# Patient Record
Sex: Female | Born: 1960
Health system: Southern US, Community
[De-identification: ages and names within clinical notes are randomized; demographics above are authoritative.]

## PROBLEM LIST (undated history)

## (undated) DIAGNOSIS — F79 Unspecified intellectual disabilities: Secondary | ICD-10-CM

## (undated) DIAGNOSIS — M1990 Unspecified osteoarthritis, unspecified site: Secondary | ICD-10-CM

## (undated) DIAGNOSIS — F32A Depression, unspecified: Secondary | ICD-10-CM

## (undated) DIAGNOSIS — T8859XA Other complications of anesthesia, initial encounter: Secondary | ICD-10-CM

## (undated) DIAGNOSIS — F419 Anxiety disorder, unspecified: Secondary | ICD-10-CM

## (undated) DIAGNOSIS — K760 Fatty (change of) liver, not elsewhere classified: Secondary | ICD-10-CM

## (undated) DIAGNOSIS — F329 Major depressive disorder, single episode, unspecified: Secondary | ICD-10-CM

## (undated) DIAGNOSIS — R519 Headache, unspecified: Secondary | ICD-10-CM

## (undated) DIAGNOSIS — K56609 Unspecified intestinal obstruction, unspecified as to partial versus complete obstruction: Secondary | ICD-10-CM

## (undated) DIAGNOSIS — T7840XA Allergy, unspecified, initial encounter: Secondary | ICD-10-CM

## (undated) HISTORY — PX: OTHER SURGICAL HISTORY: SHX169

## (undated) HISTORY — PX: ABDOMINAL HYSTERECTOMY: SHX81

## (undated) HISTORY — PX: SIGMOIDOSCOPY: SUR1295

## (undated) HISTORY — PX: COLONOSCOPY: SHX174

## (undated) HISTORY — DX: Allergy, unspecified, initial encounter: T78.40XA

## (undated) HISTORY — DX: Depression, unspecified: F32.A

## (undated) HISTORY — PX: POLYPECTOMY: SHX149

## (undated) HISTORY — DX: Major depressive disorder, single episode, unspecified: F32.9

## (undated) HISTORY — PX: KNEE DISLOCATION SURGERY: SHX689

## (undated) HISTORY — DX: Anxiety disorder, unspecified: F41.9

## (undated) HISTORY — DX: Fatty (change of) liver, not elsewhere classified: K76.0

## (undated) HISTORY — DX: Unspecified intellectual disabilities: F79

## (undated) HISTORY — DX: Unspecified osteoarthritis, unspecified site: M19.90

## (undated) HISTORY — DX: Unspecified intestinal obstruction, unspecified as to partial versus complete obstruction: K56.609

---

## 1999-06-15 ENCOUNTER — Ambulatory Visit (HOSPITAL_COMMUNITY): Admission: RE | Admit: 1999-06-15 | Discharge: 1999-06-15 | Payer: Self-pay | Admitting: Family Medicine

## 1999-06-15 ENCOUNTER — Encounter: Payer: Self-pay | Admitting: Family Medicine

## 2000-02-18 ENCOUNTER — Emergency Department (HOSPITAL_COMMUNITY): Admission: EM | Admit: 2000-02-18 | Discharge: 2000-02-18 | Payer: Self-pay | Admitting: Emergency Medicine

## 2000-02-18 ENCOUNTER — Encounter: Payer: Self-pay | Admitting: Emergency Medicine

## 2000-03-13 ENCOUNTER — Encounter: Admission: RE | Admit: 2000-03-13 | Discharge: 2000-06-11 | Payer: Self-pay | Admitting: Family Medicine

## 2000-11-07 ENCOUNTER — Other Ambulatory Visit: Admission: RE | Admit: 2000-11-07 | Discharge: 2000-11-07 | Payer: Self-pay | Admitting: Obstetrics and Gynecology

## 2002-11-17 ENCOUNTER — Encounter: Admission: RE | Admit: 2002-11-17 | Discharge: 2002-11-17 | Payer: Self-pay

## 2002-12-18 ENCOUNTER — Emergency Department (HOSPITAL_COMMUNITY): Admission: EM | Admit: 2002-12-18 | Discharge: 2002-12-18 | Payer: Self-pay | Admitting: Emergency Medicine

## 2003-02-02 ENCOUNTER — Inpatient Hospital Stay (HOSPITAL_COMMUNITY): Admission: RE | Admit: 2003-02-02 | Discharge: 2003-02-04 | Payer: Self-pay

## 2005-02-28 ENCOUNTER — Encounter: Admission: RE | Admit: 2005-02-28 | Discharge: 2005-05-29 | Payer: Self-pay | Admitting: Orthopedic Surgery

## 2011-04-04 ENCOUNTER — Other Ambulatory Visit: Payer: Self-pay | Admitting: Family Medicine

## 2011-04-04 ENCOUNTER — Ambulatory Visit
Admission: RE | Admit: 2011-04-04 | Discharge: 2011-04-04 | Disposition: A | Discharge status: Home / Self Care | Payer: PRIVATE HEALTH INSURANCE | Source: Ambulatory Visit | Attending: Family Medicine | Admitting: Family Medicine

## 2011-08-09 ENCOUNTER — Encounter: Payer: Self-pay | Admitting: Gastroenterology

## 2011-08-14 ENCOUNTER — Ambulatory Visit (INDEPENDENT_AMBULATORY_CARE_PROVIDER_SITE_OTHER): Payer: PRIVATE HEALTH INSURANCE | Admitting: Gastroenterology

## 2011-08-14 ENCOUNTER — Encounter: Payer: Self-pay | Admitting: Gastroenterology

## 2011-08-14 ENCOUNTER — Other Ambulatory Visit (INDEPENDENT_AMBULATORY_CARE_PROVIDER_SITE_OTHER): Payer: PRIVATE HEALTH INSURANCE

## 2011-08-14 VITALS — BP 94/64 | HR 88 | Ht 59.0 in | Wt 150.0 lb

## 2011-08-14 DIAGNOSIS — R1084 Generalized abdominal pain: Secondary | ICD-10-CM

## 2011-08-14 DIAGNOSIS — R634 Abnormal weight loss: Secondary | ICD-10-CM

## 2011-08-14 DIAGNOSIS — R198 Other specified symptoms and signs involving the digestive system and abdomen: Secondary | ICD-10-CM

## 2011-08-14 LAB — LIPASE: Lipase: 46 U/L (ref 11.0–59.0)

## 2011-08-14 MED ORDER — ALIGN 4 MG PO CAPS: 1.0000 | ORAL_CAPSULE | Freq: Every day | ORAL | Status: DC

## 2011-08-14 MED ORDER — GLYCOPYRROLATE 2 MG PO TABS: 2.0000 mg | ORAL_TABLET | Freq: Two times a day (BID) | ORAL | Status: DC

## 2011-08-14 NOTE — Progress Notes (Signed)
History of Present Illness: This is a 50 year old woman here today with her mother. Patient has had a 3-4 month history of recurrent generalized abdominal pain associated with a change in bowel habits. Mother provides the majority of the history. Patient has a long history of mild constipation but about 3-4 months ago started having episodes of loose stools and generalized abdominal pain. She was prescribed Librium as needed which helps to relieve her symptoms however she takes it infrequently. She has taken Imodium over-the-counter as needed which will control her diarrhea. Nexium was tried with no impact on symptoms. She notes a 10 pound weight loss and occasional episodes of heartburn and nausea. Abdominal ultrasound and pelvic ultrasound performed earlier this year were remarkable only for fatty infiltration of the liver. Blood work was unremarkable including a tissue transglutaminase IgA. Lipase was not obtained.   Past Medical History  Diagnosis Date  . Fatty infiltration of liver   . Anxiety   . Arthritis   . Intestinal obstruction     as an infant  . Mental retardation    Past Surgical History  Procedure Date  . Abdominal hysterectomy   . Fused knee     right knee    reports that she has never smoked. She has never used smokeless tobacco. She reports that she does not drink alcohol or use illicit drugs. family history includes Diabetes in an unspecified family member. No Known Allergies    Outpatient Encounter Prescriptions as of 08/14/2011  Medication Sig Dispense Refill  . chlordiazePOXIDE (LIBRIUM) 5 MG capsule Take 5 mg by mouth 4 (four) times daily as needed.        Marland Kitchen glycopyrrolate (ROBINUL) 2 MG tablet Take 1 tablet (2 mg total) by mouth 2 (two) times daily.  60 tablet  11  . Probiotic Product (ALIGN) 4 MG CAPS Take 1 capsule by mouth daily.  30 capsule  0   Review of Systems: Pertinent positive and negative review of systems were noted in the above HPI section. All other  review of systems were otherwise negative.  Physical Exam: General: Well developed , well nourished, no acute distress Head: Normocephalic and atraumatic Eyes:  sclerae anicteric, EOMI Ears: Normal auditory acuity Mouth: No deformity or lesions Neck: Supple, no masses or thyromegaly Lungs: Clear throughout to auscultation Heart: Regular rate and rhythm; no murmurs, rubs or bruits Abdomen: Soft, non tender and non distended. No masses, hepatosplenomegaly or hernias noted. Normal Bowel sounds Rectal: Deferred to colonoscopy  Musculoskeletal: Symmetrical with no gross deformities  Skin: No lesions on visible extremities Pulses:  Normal pulses noted Extremities: Fused right knee. Uses a walker. No clubbing, cyanosis, edema or deformities noted Neurological: Alert oriented x 4, grossly nonfocal Cervical Nodes:  No significant cervical adenopathy Inguinal Nodes: No significant inguinal adenopathy Psychological:  Alert and cooperative. Normal mood and affect  Assessment and Recommendations:  1. Episodic generalized abdominal pain associated with change in bowel habits and weight loss. Rule out inflammatory bowel disease, colorectal neoplasms, ulcer disease, GERD, irritable bowel syndrome and other disorders. Obtain a lipase. Begin glycopyrrolate 2 mg twice daily and use Librium for breakthrough symptoms as needed. Schedule colonoscopy and upper endoscopy for further evaluation. The risks, benefits, and alternatives to colonoscopy with possible biopsy and possible polypectomy were discussed with the patient and they consent to proceed. The risks, benefits, and alternatives to endoscopy with possible biopsy and possible dilation were discussed with the patient and they consent to proceed. Consider abdominal/pelvic CT scan and endoscopic  studies are unremarkable and her symptoms remain active.

## 2011-08-14 NOTE — Patient Instructions (Addendum)
Please go to the basement today to have lab work drawn.  You have been scheduled for an Upper Endoscopy and Colonoscopy.  See separate sheet. You have been given a prescription and Align samples given to take one daily.  cc: Burton Apley MD

## 2011-08-25 ENCOUNTER — Ambulatory Visit (AMBULATORY_SURGERY_CENTER): Payer: PRIVATE HEALTH INSURANCE | Admitting: Gastroenterology

## 2011-08-25 ENCOUNTER — Encounter: Payer: Self-pay | Admitting: Gastroenterology

## 2011-08-25 VITALS — BP 154/91 | HR 117 | Resp 15 | Ht 59.0 in | Wt 150.0 lb

## 2011-08-25 DIAGNOSIS — R1084 Generalized abdominal pain: Secondary | ICD-10-CM

## 2011-08-25 DIAGNOSIS — D126 Benign neoplasm of colon, unspecified: Secondary | ICD-10-CM

## 2011-08-25 DIAGNOSIS — R634 Abnormal weight loss: Secondary | ICD-10-CM

## 2011-08-25 DIAGNOSIS — K2940 Chronic atrophic gastritis without bleeding: Secondary | ICD-10-CM

## 2011-08-25 DIAGNOSIS — R198 Other specified symptoms and signs involving the digestive system and abdomen: Secondary | ICD-10-CM

## 2011-08-25 MED ORDER — SODIUM CHLORIDE 0.9 % IV SOLN: 500.0000 mL | INTRAVENOUS | Status: DC

## 2011-08-25 NOTE — Patient Instructions (Signed)
Please refer to blue and green discharge instruction sheets. 

## 2011-08-29 ENCOUNTER — Telehealth: Payer: Self-pay | Admitting: *Deleted

## 2011-08-29 NOTE — Telephone Encounter (Signed)
No ID on voice mail.   No message left. 

## 2011-08-31 ENCOUNTER — Encounter: Payer: Self-pay | Admitting: Gastroenterology

## 2011-11-08 ENCOUNTER — Ambulatory Visit (AMBULATORY_SURGERY_CENTER): Payer: 59 | Admitting: *Deleted

## 2011-11-08 VITALS — Ht 59.0 in | Wt 148.3 lb

## 2011-11-08 DIAGNOSIS — Z9889 Other specified postprocedural states: Secondary | ICD-10-CM

## 2011-11-08 DIAGNOSIS — Z1211 Encounter for screening for malignant neoplasm of colon: Secondary | ICD-10-CM

## 2011-11-14 ENCOUNTER — Other Ambulatory Visit: Payer: PRIVATE HEALTH INSURANCE | Admitting: Gastroenterology

## 2011-11-22 ENCOUNTER — Encounter: Payer: Self-pay | Admitting: Gastroenterology

## 2011-11-22 ENCOUNTER — Ambulatory Visit (AMBULATORY_SURGERY_CENTER): Payer: PRIVATE HEALTH INSURANCE | Admitting: Gastroenterology

## 2011-11-22 DIAGNOSIS — D126 Benign neoplasm of colon, unspecified: Secondary | ICD-10-CM

## 2011-11-22 DIAGNOSIS — Z9889 Other specified postprocedural states: Secondary | ICD-10-CM

## 2011-11-22 DIAGNOSIS — Z86010 Personal history of colonic polyps: Secondary | ICD-10-CM

## 2011-11-22 DIAGNOSIS — Z1211 Encounter for screening for malignant neoplasm of colon: Secondary | ICD-10-CM

## 2011-11-22 MED ORDER — SODIUM CHLORIDE 0.9 % IV SOLN: 500.0000 mL | INTRAVENOUS | Status: DC

## 2011-11-22 NOTE — Progress Notes (Signed)
Patient did not experience any of the following events: a burn prior to discharge; a fall within the facility; wrong site/side/patient/procedure/implant event; or a hospital transfer or hospital admission upon discharge from the facility. (G8907) Patient did not have preoperative order for IV antibiotic SSI prophylaxis. (G8918)  

## 2011-11-22 NOTE — Progress Notes (Signed)
Propofol per m smith crna. See scanned intra procedure report. ewm 

## 2011-11-23 ENCOUNTER — Telehealth: Payer: Self-pay | Admitting: *Deleted

## 2011-11-23 NOTE — Telephone Encounter (Signed)
Left message

## 2011-11-27 ENCOUNTER — Encounter: Payer: Self-pay | Admitting: Gastroenterology

## 2011-11-30 ENCOUNTER — Telehealth: Payer: Self-pay | Admitting: Gastroenterology

## 2011-11-30 MED ORDER — HYOSCYAMINE SULFATE ER 0.375 MG PO TB12: 375.0000 ug | ORAL_TABLET | Freq: Two times a day (BID) | ORAL | Status: DC | PRN

## 2011-11-30 NOTE — Telephone Encounter (Signed)
Can try Levbid twice daily and use Librium for breakthrough symptoms as needed.  Make sure that she is using Miralax on a regular schedule if having any constipation

## 2011-11-30 NOTE — Telephone Encounter (Signed)
Robinul is not helping with her IBS pain.  Can we change antispasmodic?

## 2011-12-01 ENCOUNTER — Telehealth: Payer: Self-pay | Admitting: Gastroenterology

## 2011-12-01 NOTE — Telephone Encounter (Signed)
Discussed levbid with the patient's mother.  All questions answered

## 2011-12-13 ENCOUNTER — Ambulatory Visit (INDEPENDENT_AMBULATORY_CARE_PROVIDER_SITE_OTHER): Payer: 59

## 2011-12-13 DIAGNOSIS — K589 Irritable bowel syndrome without diarrhea: Secondary | ICD-10-CM

## 2011-12-13 DIAGNOSIS — R1084 Generalized abdominal pain: Secondary | ICD-10-CM

## 2011-12-17 ENCOUNTER — Ambulatory Visit (INDEPENDENT_AMBULATORY_CARE_PROVIDER_SITE_OTHER): Payer: 59

## 2011-12-17 DIAGNOSIS — K589 Irritable bowel syndrome without diarrhea: Secondary | ICD-10-CM

## 2011-12-17 DIAGNOSIS — R1084 Generalized abdominal pain: Secondary | ICD-10-CM

## 2011-12-17 DIAGNOSIS — G250 Essential tremor: Secondary | ICD-10-CM

## 2011-12-17 DIAGNOSIS — G252 Other specified forms of tremor: Secondary | ICD-10-CM

## 2011-12-22 ENCOUNTER — Ambulatory Visit (INDEPENDENT_AMBULATORY_CARE_PROVIDER_SITE_OTHER): Payer: 59

## 2011-12-22 DIAGNOSIS — R059 Cough, unspecified: Secondary | ICD-10-CM

## 2011-12-22 DIAGNOSIS — J40 Bronchitis, not specified as acute or chronic: Secondary | ICD-10-CM

## 2011-12-22 DIAGNOSIS — R05 Cough: Secondary | ICD-10-CM

## 2011-12-22 DIAGNOSIS — J019 Acute sinusitis, unspecified: Secondary | ICD-10-CM

## 2012-01-05 ENCOUNTER — Ambulatory Visit (INDEPENDENT_AMBULATORY_CARE_PROVIDER_SITE_OTHER): Payer: 59

## 2012-01-05 DIAGNOSIS — F339 Major depressive disorder, recurrent, unspecified: Secondary | ICD-10-CM

## 2012-01-15 ENCOUNTER — Ambulatory Visit (INDEPENDENT_AMBULATORY_CARE_PROVIDER_SITE_OTHER): Payer: 59

## 2012-01-15 DIAGNOSIS — G252 Other specified forms of tremor: Secondary | ICD-10-CM

## 2012-01-15 DIAGNOSIS — F339 Major depressive disorder, recurrent, unspecified: Secondary | ICD-10-CM

## 2012-01-15 DIAGNOSIS — G250 Essential tremor: Secondary | ICD-10-CM

## 2012-04-17 ENCOUNTER — Telehealth: Payer: Self-pay

## 2012-04-17 ENCOUNTER — Ambulatory Visit (INDEPENDENT_AMBULATORY_CARE_PROVIDER_SITE_OTHER): Payer: 59 | Admitting: Family Medicine

## 2012-04-17 VITALS — BP 104/68 | HR 82 | Temp 98.8°F | Resp 18 | Ht 59.25 in | Wt 133.0 lb

## 2012-04-17 DIAGNOSIS — K589 Irritable bowel syndrome without diarrhea: Secondary | ICD-10-CM

## 2012-04-17 DIAGNOSIS — F3342 Major depressive disorder, recurrent, in full remission: Secondary | ICD-10-CM

## 2012-04-17 MED ORDER — PAROXETINE HCL 20 MG PO TABS: 20.0000 mg | ORAL_TABLET | ORAL | Status: DC

## 2012-04-17 MED ORDER — RANITIDINE HCL 300 MG PO TABS: 300.0000 mg | ORAL_TABLET | Freq: Every day | ORAL | Status: DC

## 2012-04-17 MED ORDER — CLONAZEPAM 0.5 MG PO TABS: 0.5000 mg | ORAL_TABLET | Freq: Two times a day (BID) | ORAL | Status: DC | PRN

## 2012-04-17 NOTE — Telephone Encounter (Signed)
Pt states she is to take 30 mg of paxil, script sent for 20.  It was increased in January according to the pharmacy. [facility]

## 2012-04-17 NOTE — Progress Notes (Signed)
51 yo woman cared for by mother, treated for depression.  She's now doing much better re: MOOD.  No longer cries all day.  Continues to have stomach cramps, drinking  Kefir instead of probiotic.  No diarrhea, hematochezia.  Will have follow up colonscopy in June.  O: NAD, smiling.   Seen with mother HEENT:  Unremarkable Chest: clear Heart: reg, no murmur Abdomen: diffuse tenderness  A:  Doing well., IBS, depression  P:  Add ranitidine 300 at hs. cpm rechk 3 months

## 2012-04-18 MED ORDER — PAROXETINE HCL 30 MG PO TABS: 30.0000 mg | ORAL_TABLET | ORAL | Status: DC

## 2012-04-18 NOTE — Telephone Encounter (Signed)
Spoke to [employer]. Let her know about the script.

## 2012-05-03 ENCOUNTER — Telehealth: Payer: Self-pay

## 2012-05-03 NOTE — Telephone Encounter (Signed)
Patient was prescribed medicine for stomach pain, but she is to only take it at night.  Her mother would like to know if we could call her in something for the pain during the day because it is hurting her all the time.  Please call

## 2012-05-03 NOTE — Telephone Encounter (Signed)
What is patient taking?

## 2012-05-03 NOTE — Telephone Encounter (Signed)
Patient is taking ranaitidine 300 mg at bedtime.  They thought it was a pain pill.  Can she have anything for pain?

## 2012-05-03 NOTE — Telephone Encounter (Signed)
Can increase the ranitidine to BID.  If that doesn't help, advise RTC or evaluate with GI (it appears that the patient has a colonoscopy scheduled for June, so perhaps the GI provider could see her sooner?).

## 2012-05-03 NOTE — Telephone Encounter (Signed)
Can we give her anything for pain during the day?

## 2012-05-04 NOTE — Telephone Encounter (Signed)
Spoke with pts daughter and she will try taking the medicine twice a day and then recheck with Dr. Milus Glazier

## 2012-05-07 ENCOUNTER — Ambulatory Visit (INDEPENDENT_AMBULATORY_CARE_PROVIDER_SITE_OTHER): Payer: 59 | Admitting: Family Medicine

## 2012-05-07 VITALS — BP 109/72 | HR 85 | Temp 97.9°F | Resp 16 | Ht 58.5 in | Wt 133.0 lb

## 2012-05-07 DIAGNOSIS — K589 Irritable bowel syndrome without diarrhea: Secondary | ICD-10-CM

## 2012-05-07 MED ORDER — HYOSCYAMINE SULFATE ER 0.375 MG PO TB12: 375.0000 ug | ORAL_TABLET | Freq: Two times a day (BID) | ORAL | Status: DC | PRN

## 2012-05-07 MED ORDER — RANITIDINE HCL 150 MG PO TABS: 300.0000 mg | ORAL_TABLET | Freq: Two times a day (BID) | ORAL | Status: DC

## 2012-05-07 NOTE — Progress Notes (Signed)
  Subjective:    Patient ID: Heather [name], female    DOB: Nov 20, 1961, 51 y.o.   MRN: [id number]  HPI 51 yo female with depression, abdominal pain, MR.  Here with her mother.  Seen last month and given ranitidine for stomach pain.  Wants to take twice a day but is on 300 mg.  Does help at night but still has pain during the day.  Tried it twice a day a couple days and did help.  Long standing history of abdominal pain.  Sees Dr. Russella Dar at [facility].  Has appt next month.  In December he prescribed her hyoscamine for IBS but they never picked it up to try it.    Review of Systems Negative except as per HPI     Objective:   Physical Exam  Constitutional: Vital signs are normal. She appears well-developed and well-nourished. She is active.  Cardiovascular: Normal rate, regular rhythm, normal heart sounds and normal pulses.   Pulmonary/Chest: Effort normal and breath sounds normal.  Abdominal: Soft. Normal appearance and bowel sounds are normal. She exhibits no distension and no mass. There is no hepatosplenomegaly. There is no tenderness. There is no rigidity, no rebound, no guarding, no CVA tenderness, no tenderness at McBurney's point and negative Murphy's sign. No hernia.  Neurological: She is alert.          Assessment & Plan:  Abdominal pain, IBS - Levbid re-prescribed.  Decreased zantac to 150 mg so that she could try it twice a day if needed.  Follow-up with GI as scheduled.

## 2012-05-23 ENCOUNTER — Encounter: Payer: Self-pay | Admitting: Gastroenterology

## 2012-06-25 ENCOUNTER — Encounter: Payer: Self-pay | Admitting: Gastroenterology

## 2012-06-25 ENCOUNTER — Ambulatory Visit (AMBULATORY_SURGERY_CENTER): Payer: PRIVATE HEALTH INSURANCE

## 2012-06-25 VITALS — Ht 59.0 in | Wt 132.3 lb

## 2012-06-25 DIAGNOSIS — Z1211 Encounter for screening for malignant neoplasm of colon: Secondary | ICD-10-CM

## 2012-06-25 DIAGNOSIS — Z09 Encounter for follow-up examination after completed treatment for conditions other than malignant neoplasm: Secondary | ICD-10-CM

## 2012-06-25 DIAGNOSIS — Z86010 Personal history of colonic polyps: Secondary | ICD-10-CM

## 2012-06-25 NOTE — Progress Notes (Signed)
Okey Regal (mother) came with pt to her pre-visit prior to her flexible sigmoidoscopy with Dr Russella Dar on 07/09/12. The mother will be with the pt at her flex sigmoidoscopy appt to help explain paperwork and answer questions. Ulis Rias RN

## 2012-07-09 ENCOUNTER — Other Ambulatory Visit: Payer: PRIVATE HEALTH INSURANCE | Admitting: Gastroenterology

## 2012-07-26 ENCOUNTER — Ambulatory Visit (AMBULATORY_SURGERY_CENTER): Payer: PRIVATE HEALTH INSURANCE | Admitting: Gastroenterology

## 2012-07-26 ENCOUNTER — Encounter: Payer: Self-pay | Admitting: Gastroenterology

## 2012-07-26 VITALS — BP 138/98 | HR 78 | Temp 99.4°F | Resp 20 | Ht 59.0 in | Wt 132.0 lb

## 2012-07-26 DIAGNOSIS — Z1211 Encounter for screening for malignant neoplasm of colon: Secondary | ICD-10-CM

## 2012-07-26 DIAGNOSIS — Z86010 Personal history of colonic polyps: Secondary | ICD-10-CM

## 2012-07-26 MED ORDER — SODIUM CHLORIDE 0.9 % IV SOLN: 500.0000 mL | INTRAVENOUS | Status: DC

## 2012-07-26 NOTE — Progress Notes (Signed)
Patient did not experience any of the following events: a burn prior to discharge; a fall within the facility; wrong site/side/patient/procedure/implant event; or a hospital transfer or hospital admission upon discharge from the facility. (G8907) Patient did not have preoperative order for IV antibiotic SSI prophylaxis. (G8918)  

## 2012-07-26 NOTE — Patient Instructions (Signed)
Diverticulosis and hemorrhoids seen today. See handouts given. Repeat colonoscopy in 2 years. Thank you!!  YOU HAD AN ENDOSCOPIC PROCEDURE TODAY AT THE [facility]: Refer to the procedure report that was given to you for any specific questions about what was found during the examination.  If the procedure report does not answer your questions, please call your gastroenterologist to clarify.  If you requested that your care partner not be given the details of your procedure findings, then the procedure report has been included in a sealed envelope for you to review at your convenience later.  YOU SHOULD EXPECT: Some feelings of bloating in the abdomen. Passage of more gas than usual.  Walking can help get rid of the air that was put into your GI tract during the procedure and reduce the bloating. If you had a lower endoscopy (such as a colonoscopy or flexible sigmoidoscopy) you may notice spotting of blood in your stool or on the toilet paper. If you underwent a bowel prep for your procedure, then you may not have a normal bowel movement for a few days.  DIET: Your first meal following the procedure should be a light meal and then it is ok to progress to your normal diet.  A half-sandwich or bowl of soup is an example of a good first meal.  Heavy or fried foods are harder to digest and may make you feel nauseous or bloated.  Likewise meals heavy in dairy and vegetables can cause extra gas to form and this can also increase the bloating.  Drink plenty of fluids but you should avoid alcoholic beverages for 24 hours.  ACTIVITY: Your care partner should take you home directly after the procedure.  You should plan to take it easy, moving slowly for the rest of the day.  You can resume normal activity the day after the procedure however you should NOT DRIVE or use heavy machinery for 24 hours (because of the sedation medicines used during the test).    SYMPTOMS TO REPORT IMMEDIATELY: A  gastroenterologist can be reached at any hour.  During normal business hours, 8:30 AM to 5:00 PM Monday through Friday, call [phone].  After hours and on weekends, please call the GI answering service at [phone] who will take a message and have the physician on call contact you.   Following lower endoscopy (colonoscopy or flexible sigmoidoscopy):  Excessive amounts of blood in the stool  Significant tenderness or worsening of abdominal pains  Swelling of the abdomen that is new, acute  Fever of 100F or higher  FOLLOW UP: If any biopsies were taken you will be contacted by phone or by letter within the next 1-3 weeks.  Call your gastroenterologist if you have not heard about the biopsies in 3 weeks.  Our staff will call the home number listed on your records the next business day following your procedure to check on you and address any questions or concerns that you may have at that time regarding the information given to you following your procedure. This is a courtesy call and so if there is no answer at the home number and we have not heard from you through the emergency physician on call, we will assume that you have returned to your regular daily activities without incident.  SIGNATURES/CONFIDENTIALITY: You and/or your care partner have signed paperwork which will be entered into your electronic medical record.  These signatures attest to the fact that that the information above on your After  Visit Summary has been reviewed and is understood.  Full responsibility of the confidentiality of this discharge information lies with you and/or your care-partner.

## 2012-07-26 NOTE — Op Note (Signed)
[facility] [address] [place], [place]  [address]  FLEXIBLE SIGMOIDOSCOPY PROCEDURE REPORT  PATIENT:  Heather [name], Heather [name]  MR#:  [id number] BIRTHDATE:  [birth year], 51 yrs. old  GENDER:  female ENDOSCOPIST:  Judie Petit T. Russella Dar, MD, [facility]  PROCEDURE DATE:  07/26/2012 PROCEDURE:  Flexible Sigmoidoscopy, diagnostic ASA CLASS:  Class III INDICATIONS:  follow-up of polyp, rectal TVA with HGD removed piecemeal, assess if polypectomy was complete MEDICATIONS:   MAC sedation, administered by CRNA, propofol (Diprivan) 100 mg IV DESCRIPTION OF PROCEDURE:   After the risks benefits and alternatives of the procedure were thoroughly explained, informed consent was obtained.  Digital rectal exam was performed and revealed no abnormalities.   The [id number] endoscope was introduced through the anus and advanced to the descending colon, without limitations.  The quality of the prep was good. The instrument was then slowly withdrawn as the mucosa was fully examined. <<PROCEDUREIMAGES>> Moderate diverticulosis was found rectum to descending colon. The polypectomy site and tattoa were noted in the rectum. No residual polyp noted.  Retroflexed views in the rectum revealed small internal hemorrhoids.    The scope was then withdrawn from the patient and the procedure terminated.  COMPLICATIONS:  None  ENDOSCOPIC IMPRESSION: 1) Moderate diverticulosis in the rectum to descending colon 2) Polypectomy site and prior tattoo in the rectum 3) Small internal hemorrhoids  RECOMMENDATIONS: 1) Colonscopy in 2 years.  Venita Lick. Russella Dar, MD, [facility]  CC:  Elvina Sidle, MD  n. Rosalie DoctorVenita Lick. Malini Flemings at 07/26/2012 03:47 PM  Neva Seat, [place], [phone]

## 2012-07-29 ENCOUNTER — Telehealth: Payer: Self-pay

## 2012-07-29 NOTE — Telephone Encounter (Signed)
Left message on answering machine. 

## 2012-09-14 ENCOUNTER — Emergency Department (HOSPITAL_COMMUNITY): Payer: Self-pay

## 2012-09-14 ENCOUNTER — Emergency Department (HOSPITAL_COMMUNITY)
Admission: EM | Admit: 2012-09-14 | Discharge: 2012-09-14 | Disposition: A | Discharge status: Home / Self Care | Payer: Self-pay | Attending: Emergency Medicine | Admitting: Emergency Medicine

## 2012-09-14 ENCOUNTER — Emergency Department (HOSPITAL_COMMUNITY): Payer: PRIVATE HEALTH INSURANCE

## 2012-09-14 ENCOUNTER — Encounter (HOSPITAL_COMMUNITY): Payer: Self-pay | Admitting: Emergency Medicine

## 2012-09-14 ENCOUNTER — Emergency Department (HOSPITAL_COMMUNITY)
Admission: EM | Admit: 2012-09-14 | Discharge: 2012-09-14 | Disposition: A | Discharge status: Home / Self Care | Payer: PRIVATE HEALTH INSURANCE | Attending: Emergency Medicine | Admitting: Emergency Medicine

## 2012-09-14 DIAGNOSIS — Y92009 Unspecified place in unspecified non-institutional (private) residence as the place of occurrence of the external cause: Secondary | ICD-10-CM | POA: Insufficient documentation

## 2012-09-14 DIAGNOSIS — S83006A Unspecified dislocation of unspecified patella, initial encounter: Secondary | ICD-10-CM | POA: Insufficient documentation

## 2012-09-14 DIAGNOSIS — N390 Urinary tract infection, site not specified: Secondary | ICD-10-CM | POA: Insufficient documentation

## 2012-09-14 DIAGNOSIS — X58XXXA Exposure to other specified factors, initial encounter: Secondary | ICD-10-CM | POA: Insufficient documentation

## 2012-09-14 DIAGNOSIS — M25569 Pain in unspecified knee: Secondary | ICD-10-CM | POA: Insufficient documentation

## 2012-09-14 LAB — URINALYSIS, ROUTINE W REFLEX MICROSCOPIC
Bilirubin Urine: NEGATIVE
Glucose, UA: NEGATIVE mg/dL
Hgb urine dipstick: NEGATIVE
Ketones, ur: 40 mg/dL — AB
Nitrite: NEGATIVE
Protein, ur: NEGATIVE mg/dL
Specific Gravity, Urine: 1.027 (ref 1.005–1.030)
Urobilinogen, UA: 1 mg/dL (ref 0.0–1.0)
pH: 6 (ref 5.0–8.0)

## 2012-09-14 LAB — URINE MICROSCOPIC-ADD ON

## 2012-09-14 MED ORDER — CIPROFLOXACIN HCL 500 MG PO TABS: 500.0000 mg | ORAL_TABLET | Freq: Two times a day (BID) | ORAL | Status: DC

## 2012-09-14 MED ORDER — ACETAMINOPHEN 325 MG PO TABS
650.0000 mg | ORAL_TABLET | Freq: Once | ORAL | Status: AC
  Administered 2012-09-14: 650 mg via ORAL
  Filled 2012-09-14: qty 2

## 2012-09-14 MED ORDER — HYDROCODONE-ACETAMINOPHEN 5-500 MG PO TABS: 1.0000 | ORAL_TABLET | Freq: Four times a day (QID) | ORAL | Status: DC | PRN

## 2012-09-14 NOTE — ED Provider Notes (Signed)
History     CSN: [id number]  Arrival date & time 09/14/12  0315   First MD Initiated Contact with Patient 09/14/12 0402      No chief complaint on file.   (Consider location/radiation/quality/duration/timing/severity/associated sxs/prior treatment) HPI  Patient presents to the emergency department by EMS for left kneecap dislocation. She says that she was sleeping when all of a sudden her kneecap went out of place. Family members say that she woke up screaming in pain. She admits that this has happened once before on this leg and that it used to happen on the right leg and that she required a knee effusion because of this. In wrists and knee relocated itself. Patient is no longer in acute pain but denies needing or wanting anything for pain at this time. It is incidentally noted that her temperature is 100.2 she denies having any symptoms associated with fevers.  NAD  Past Medical History  Diagnosis Date  . Fatty infiltration of liver   . Anxiety   . Arthritis   . Intestinal obstruction     as an infant  . Mental retardation     Past Surgical History  Procedure Date  . Abdominal hysterectomy   . Fused knee     right knee  . Sigmoidoscopy   . Polypectomy   . Colonoscopy     Family History  Problem Relation Age of Onset  . Diabetes    . Diabetes Mother     History  Substance Use Topics  . Smoking status: Never Smoker   . Smokeless tobacco: Never Used  . Alcohol Use: Yes     1 GLASS RED WINE 1X/MONTH WITH SPAGHETTI    OB History    Grav Para Term Preterm Abortions TAB SAB Ect Mult Living                  Review of Systems  Review of Systems  Gen: no weight loss, fevers, chills, night sweats  Eyes: no discharge or drainage, no occular pain or visual changes  Nose: no epistaxis or rhinorrhea  Mouth: no dental pain, no sore throat  Neck: no neck pain  Lungs:No wheezing, coughing or hemoptysis CV: no chest pain, palpitations, dependent edema or orthopnea    Abd: no abdominal pain, nausea, vomiting  GU: no dysuria or gross hematuria  MSK: Left knee pain which has resolved. Neuro: no headache, no focal neurologic deficits  Skin: no abnormalities Psyche: negative.   Allergies  Review of patient's allergies indicates no known allergies.  Home Medications   Current Outpatient Rx  Name Route Sig Dispense Refill  . ALEVE PO Oral Take 1 tablet by mouth daily as needed. For arthritis pain    . PAROXETINE HCL 30 MG PO TABS Oral Take 1 tablet (30 mg total) by mouth every morning. 30 tablet 5  . CIPROFLOXACIN HCL 500 MG PO TABS Oral Take 1 tablet (500 mg total) by mouth 2 (two) times daily. 14 tablet 0  . HYDROCODONE-ACETAMINOPHEN 5-500 MG PO TABS Oral Take 1-2 tablets by mouth every 6 (six) hours as needed for pain. 10 tablet 0    BP 104/59  Pulse 86  Temp 100.2 F (37.9 C) (Oral)  Resp 18  SpO2 100%  Physical Exam  Nursing note and vitals reviewed. Constitutional: She appears well-developed and well-nourished. No distress.  HENT:  Head: Normocephalic and atraumatic.  Eyes: Pupils are equal, round, and reactive to light.  Neck: Normal range of motion. Neck supple.  Cardiovascular: Normal rate and regular rhythm.   Pulmonary/Chest: Effort normal.  Abdominal: Soft.  Musculoskeletal:       Left knee: She exhibits normal range of motion, no swelling, no effusion, no ecchymosis, no deformity, no laceration, no erythema, normal alignment, no LCL laxity and normal patellar mobility. tenderness found. Patellar tendon (mild discomfort to palpation) tenderness noted.  Neurological: She is alert.  Skin: Skin is warm and dry.    ED Course  Procedures (including critical care time)  Labs Reviewed  URINALYSIS, ROUTINE W REFLEX MICROSCOPIC - Abnormal; Notable for the following:    APPearance HAZY (*)     Ketones, ur 40 (*)     Leukocytes, UA MODERATE (*)     All other components within normal limits  URINE MICROSCOPIC-ADD ON  URINE CULTURE    Dg Knee Complete 4 Views Left  09/14/2012  *RADIOLOGY REPORT*  Clinical Data: Evaluate for dislocation  LEFT KNEE - COMPLETE 4+ VIEW  Comparison: None  Findings: There is no evidence of fracture or dislocation.  There is no evidence of arthropathy or other focal bone abnormality. Soft tissues are unremarkable.  IMPRESSION: No acute findings.   Original Report Authenticated By: Rosealee Albee, M.D.      1. UTI (lower urinary tract infection)   2. Patellar dislocation       MDM  Dr. Dierdre Highman is evaluated the patient as well. [name] knee x-rays are negative for any fluid, dislocation, fracture to the knee joint. Patient has been placed in the immobilizer and given a referral for or shift of any further management for the current patellar dislocations. Urine was drawn and shows that she does have some leukocytes with white blood cells in her urine. I will prescribe patient antibiotics and pain medication. She is to follow up with ortho and her PCP.   Pt has been advised of the symptoms that warrant their return to the ED. Patient has voiced understanding and has agreed to follow-up with the PCP or specialist.        Dorthula Matas, PA 09/14/12 [phone]

## 2012-09-14 NOTE — ED Notes (Addendum)
Per EMS , pt. Is from home who woke up in the middle of the night screaming  of left knee pain  @ 10/10 , denies fall. Family suspected of left knee dislocation  As it happened to the same knee that was dislocated last July of 2013. Pt. Has  mental retardation but  Coherent  and able to follow commands. No SOB nor chest pain reported. With knee immobilizer in placed, left knee.

## 2012-09-14 NOTE — ED Notes (Signed)
ZOX:[id number]<AV> Expected date:09/14/12<BR> Expected time: 3:06 AM<BR> Means of arrival:Ambulance<BR> Comments:<BR> L knee dislocation

## 2012-09-15 LAB — URINE CULTURE: Colony Count: >=100000

## 2012-09-15 NOTE — ED Provider Notes (Signed)
Medical screening examination/treatment/procedure(s) were conducted as a shared visit with non-physician practitioner(s) and myself.  I personally evaluated the patient during the encounter.  LLE now normal on exam - history suggests dislocation reduced in route to the ED. Plan ortho f/u and splint now.   Sunnie Nielsen, MD 09/15/12 [phone]

## 2012-09-16 ENCOUNTER — Encounter: Payer: Self-pay | Admitting: Gastroenterology

## 2012-09-28 ENCOUNTER — Ambulatory Visit (INDEPENDENT_AMBULATORY_CARE_PROVIDER_SITE_OTHER): Payer: PRIVATE HEALTH INSURANCE | Admitting: Family Medicine

## 2012-09-28 VITALS — BP 108/68 | HR 91 | Temp 98.3°F | Resp 16 | Ht 58.5 in | Wt 128.8 lb

## 2012-09-28 DIAGNOSIS — N390 Urinary tract infection, site not specified: Secondary | ICD-10-CM

## 2012-09-28 DIAGNOSIS — R1011 Right upper quadrant pain: Secondary | ICD-10-CM

## 2012-09-28 DIAGNOSIS — G8929 Other chronic pain: Secondary | ICD-10-CM

## 2012-09-28 DIAGNOSIS — R443 Hallucinations, unspecified: Secondary | ICD-10-CM

## 2012-09-28 LAB — CBC WITH DIFFERENTIAL/PLATELET
Basophils Absolute: 0 10*3/uL (ref 0.0–0.1)
Basophils Relative: 1 % (ref 0–1)
Eosinophils Absolute: 0 10*3/uL (ref 0.0–0.7)
Eosinophils Relative: 0 % (ref 0–5)
HCT: 38.6 % (ref 36.0–46.0)
Hemoglobin: 12.9 g/dL (ref 12.0–15.0)
Lymphocytes Relative: 40 % (ref 12–46)
Lymphs Abs: 1.5 10*3/uL (ref 0.7–4.0)
MCH: 31.5 pg (ref 26.0–34.0)
MCHC: 33.4 g/dL (ref 30.0–36.0)
MCV: 94.1 fL (ref 78.0–100.0)
Monocytes Absolute: 0.2 10*3/uL (ref 0.1–1.0)
Monocytes Relative: 6 % (ref 3–12)
Neutro Abs: 1.9 10*3/uL (ref 1.7–7.7)
Neutrophils Relative %: 53 % (ref 43–77)
Platelets: 187 10*3/uL (ref 150–400)
RBC: 4.1 MIL/uL (ref 3.87–5.11)
RDW: 13.5 % (ref 11.5–15.5)
WBC: 3.6 10*3/uL — ABNORMAL LOW (ref 4.0–10.5)

## 2012-09-28 LAB — POCT URINALYSIS DIPSTICK
Bilirubin, UA: NEGATIVE
Blood, UA: NEGATIVE
Glucose, UA: NEGATIVE
Ketones, UA: NEGATIVE
Nitrite, UA: NEGATIVE
Spec Grav, UA: >=1.03
Urobilinogen, UA: 0.2
pH, UA: 6

## 2012-09-28 LAB — COMPREHENSIVE METABOLIC PANEL
ALT: 11 U/L (ref 0–35)
AST: 21 U/L (ref 0–37)
Albumin: 4 g/dL (ref 3.5–5.2)
Alkaline Phosphatase: 69 U/L (ref 39–117)
BUN: 21 mg/dL (ref 6–23)
CO2: 28 mEq/L (ref 19–32)
Calcium: 9.8 mg/dL (ref 8.4–10.5)
Chloride: 109 mEq/L (ref 96–112)
Creat: 0.84 mg/dL (ref 0.50–1.10)
Glucose, Bld: 68 mg/dL — ABNORMAL LOW (ref 70–99)
Potassium: 4.8 mEq/L (ref 3.5–5.3)
Sodium: 146 mEq/L — ABNORMAL HIGH (ref 135–145)
Total Bilirubin: 0.4 mg/dL (ref 0.3–1.2)
Total Protein: 6.2 g/dL (ref 6.0–8.3)

## 2012-09-28 LAB — POCT UA - MICROSCOPIC ONLY
Casts, Ur, LPF, POC: NEGATIVE
Crystals, Ur, HPF, POC: NEGATIVE
Mucus, UA: NEGATIVE
Yeast, UA: NEGATIVE

## 2012-09-28 MED ORDER — LEVOFLOXACIN 500 MG PO TABS: 500.0000 mg | ORAL_TABLET | Freq: Every day | ORAL | Status: DC

## 2012-09-28 NOTE — Patient Instructions (Addendum)
Urinary Tract Infection Urinary tract infections (UTIs) can develop anywhere along your urinary tract. Your urinary tract is your body's drainage system for removing wastes and extra water. Your urinary tract includes two kidneys, two ureters, a bladder, and a urethra. Your kidneys are a pair of bean-shaped organs. Each kidney is about the size of your fist. They are located below your ribs, one on each side of your spine. CAUSES Infections are caused by microbes, which are microscopic organisms, including fungi, viruses, and bacteria. These organisms are so small that they can only be seen through a microscope. Bacteria are the microbes that most commonly cause UTIs. SYMPTOMS  Symptoms of UTIs may vary by age and gender of the patient and by the location of the infection. Symptoms in young women typically include a frequent and intense urge to urinate and a painful, burning feeling in the bladder or urethra during urination. Older women and men are more likely to be tired, shaky, and weak and have muscle aches and abdominal pain. A fever may mean the infection is in your kidneys. Other symptoms of a kidney infection include pain in your back or sides below the ribs, nausea, and vomiting. DIAGNOSIS To diagnose a UTI, your caregiver will ask you about your symptoms. Your caregiver also will ask to provide a urine sample. The urine sample will be tested for bacteria and white blood cells. White blood cells are made by your body to help fight infection. TREATMENT  Typically, UTIs can be treated with medication. Because most UTIs are caused by a bacterial infection, they usually can be treated with the use of antibiotics. The choice of antibiotic and length of treatment depend on your symptoms and the type of bacteria causing your infection. HOME CARE INSTRUCTIONS  If you were prescribed antibiotics, take them exactly as your caregiver instructs you. Finish the medication even if you feel better after you  have only taken some of the medication.  Drink enough water and fluids to keep your urine clear or pale yellow.  Avoid caffeine, tea, and carbonated beverages. They tend to irritate your bladder.  Empty your bladder often. Avoid holding urine for long periods of time.  Empty your bladder before and after sexual intercourse.  After a bowel movement, women should cleanse from front to back. Use each tissue only once. SEEK MEDICAL CARE IF:   You have back pain.  You develop a fever.  Your symptoms do not begin to resolve within 3 days. SEEK IMMEDIATE MEDICAL CARE IF:   You have severe back pain or lower abdominal pain.  You develop chills.  You have nausea or vomiting.  You have continued burning or discomfort with urination. MAKE SURE YOU:   Understand these instructions.  Will watch your condition.  Will get help right away if you are not doing well or get worse. Document Released: 09/20/2005 Document Revised: 06/11/2012 Document Reviewed: 01/19/2012 [employer] Patient Information 2013 [employer].  

## 2012-09-28 NOTE — Addendum Note (Signed)
Addended by: Carollee Leitz L on: 09/28/2012 11:03 AM   Modules accepted: Orders

## 2012-09-28 NOTE — Progress Notes (Signed)
51 yo with 2-3 months of intermittent visual hallucinations.  These have decreased the last few weeks.  Recent colonoscopy/endoscopy was negative except for diverticulitis.  She is having upper abdominal cramps.  She was diagnosed with IBS  Now avoiding  Spicy foods and sausage.  She has lost 5 lbs over past 6 months.  Objective:  NAD  Alert and cheerful, good color  Chest:  Clear Heart: reg, no murmur Abdomen:  Soft, diffuse mild tenderness with no masses or HSM  [facility] reading (PRIMARY) by  Dr. Milus Glazier KUB.  Results for orders placed in visit on 09/28/12  POCT UA - MICROSCOPIC ONLY      Component Value Range   WBC, Ur, HPF, POC 10-25     RBC, urine, microscopic 0-2     Bacteria, U Microscopic trace     Mucus, UA neg     Epithelial cells, urine per micros 0-4     Crystals, Ur, HPF, POC neg     Casts, Ur, LPF, POC neg     Yeast, UA neg    POCT URINALYSIS DIPSTICK      Component Value Range   Color, UA yellow     Clarity, UA clear     Glucose, UA neg     Bilirubin, UA neg     Ketones, UA neg     Spec Grav, UA >=1.030     Blood, UA neg     pH, UA 6.0     Protein, UA trace     Urobilinogen, UA 0.2     Nitrite, UA neg     Leukocytes, UA small (1+)

## 2012-09-29 LAB — URINE CULTURE: Colony Count: >=100000

## 2012-10-01 NOTE — Progress Notes (Signed)
Pt to see Dr L for UTI f-up 10/09/12 at 1pm at 104, per msg from his assistant. Heather [name]

## 2012-10-09 ENCOUNTER — Ambulatory Visit (INDEPENDENT_AMBULATORY_CARE_PROVIDER_SITE_OTHER): Payer: PRIVATE HEALTH INSURANCE | Admitting: Family Medicine

## 2012-10-09 VITALS — BP 128/76 | HR 66 | Temp 99.3°F | Resp 18 | Ht 59.0 in | Wt 128.0 lb

## 2012-10-09 DIAGNOSIS — F411 Generalized anxiety disorder: Secondary | ICD-10-CM

## 2012-10-09 DIAGNOSIS — F419 Anxiety disorder, unspecified: Secondary | ICD-10-CM

## 2012-10-09 DIAGNOSIS — N390 Urinary tract infection, site not specified: Secondary | ICD-10-CM

## 2012-10-09 DIAGNOSIS — R300 Dysuria: Secondary | ICD-10-CM

## 2012-10-09 LAB — POCT URINALYSIS DIPSTICK
Bilirubin, UA: NEGATIVE
Blood, UA: NEGATIVE
Glucose, UA: NEGATIVE
Ketones, UA: NEGATIVE
Nitrite, UA: NEGATIVE
Protein, UA: NEGATIVE
Spec Grav, UA: 1.01
Urobilinogen, UA: 0.2
pH, UA: 6

## 2012-10-09 LAB — POCT UA - MICROSCOPIC ONLY
Casts, Ur, LPF, POC: NEGATIVE
Crystals, Ur, HPF, POC: NEGATIVE
Mucus, UA: NEGATIVE
RBC, urine, microscopic: NEGATIVE
Yeast, UA: NEGATIVE

## 2012-10-09 MED ORDER — PAROXETINE HCL 30 MG PO TABS: ORAL_TABLET | ORAL | Status: DC

## 2012-10-09 NOTE — Progress Notes (Signed)
51 yo retarded woman with recent UTI and complaints of visual hallucinations.  Since the last visit, she has had no hallucinations but she has been somewhat manipulative.  Objective:  NAD.  Walks in alert and cooperative using walker  Chest:  Clear, moderate scoliotic curve with thoracic convexity to the right Heart:  Regular, no murmur CVA:  Mildly tender right side Skin: clear and dry Abdomen: soft, nontender,  Results for orders placed in visit on 10/09/12  POCT URINALYSIS DIPSTICK      Component Value Range   Color, UA yellow     Clarity, UA hazy     Glucose, UA neg     Bilirubin, UA neg     Ketones, UA neg     Spec Grav, UA 1.010     Blood, UA neg     pH, UA 6.0     Protein, UA neg     Urobilinogen, UA 0.2     Nitrite, UA neg     Leukocytes, UA Trace    POCT UA - MICROSCOPIC ONLY      Component Value Range   WBC, Ur, HPF, POC 1-3     RBC, urine, microscopic neg     Bacteria, U Microscopic trace     Mucus, UA neg     Epithelial cells, urine per micros 0-1     Crystals, Ur, HPF, POC neg     Casts, Ur, LPF, POC neg     Yeast, UA neg      Assessment:  UTI resolved.  Anxiety  Plan: 1. Dysuria  POCT urinalysis dipstick, POCT UA - Microscopic Only  2. Anxiety  PARoxetine (PAXIL) 30 MG tablet

## 2012-11-25 ENCOUNTER — Encounter (HOSPITAL_COMMUNITY): Payer: Self-pay | Admitting: Emergency Medicine

## 2012-12-06 ENCOUNTER — Ambulatory Visit (INDEPENDENT_AMBULATORY_CARE_PROVIDER_SITE_OTHER): Payer: PRIVATE HEALTH INSURANCE | Admitting: Emergency Medicine

## 2012-12-06 VITALS — BP 106/64 | HR 82 | Temp 98.1°F | Resp 18 | Ht 59.0 in | Wt 128.2 lb

## 2012-12-06 DIAGNOSIS — M79609 Pain in unspecified limb: Secondary | ICD-10-CM

## 2012-12-06 DIAGNOSIS — IMO0002 Reserved for concepts with insufficient information to code with codable children: Secondary | ICD-10-CM

## 2012-12-06 DIAGNOSIS — L0292 Furuncle, unspecified: Secondary | ICD-10-CM

## 2012-12-06 MED ORDER — DOXYCYCLINE HYCLATE 100 MG PO CAPS: 100.0000 mg | ORAL_CAPSULE | Freq: Two times a day (BID) | ORAL | Status: DC

## 2012-12-06 NOTE — Progress Notes (Signed)
  Subjective:    Patient ID: Heather [name], female    DOB: 1961-06-29, 51 y.o.   MRN: [id number]  HPI patient is mentally challenged young lady who presents with pain beneath her right arm. She is a firm black area beneath her right arm. She notices last night and showed it to her mother. She has no history of MRSA of the    Review of Systems     Objective:   Physical Exam there is a 2-1/2 pack a half centimeter firm nodular area in the right anterior axillary line approximately 4 inches below the axilla. There is a central pustule present.        Assessment & Plan:  Plan to go ahead and  I&D the area. We'll go ahead and send off a culture treat with doxy twice a day.

## 2012-12-06 NOTE — Patient Instructions (Addendum)
Abscess An abscess is an infected area that contains a collection of pus and debris. It can occur in almost any part of the body. An abscess is also known as a furuncle or boil. CAUSES   An abscess occurs when tissue gets infected. This can occur from blockage of oil or sweat glands, infection of hair follicles, or a minor injury to the skin. As the body tries to fight the infection, pus collects in the area and creates pressure under the skin. This pressure causes pain. People with weakened immune systems have difficulty fighting infections and get certain abscesses more often.   SYMPTOMS Usually an abscess develops on the skin and becomes a painful mass that is red, warm, and tender. If the abscess forms under the skin, you may feel a moveable soft area under the skin. Some abscesses break open (rupture) on their own, but most will continue to get worse without care. The infection can spread deeper into the body and eventually into the bloodstream, causing you to feel ill.   DIAGNOSIS   Your caregiver will take your medical history and perform a physical exam. A sample of fluid may also be taken from the abscess to determine what is causing your infection. TREATMENT   Your caregiver may prescribe antibiotic medicines to fight the infection. However, taking antibiotics alone usually does not cure an abscess. Your caregiver may need to make a small cut (incision) in the abscess to drain the pus. In some cases, gauze is packed into the abscess to reduce pain and to continue draining the area. HOME CARE INSTRUCTIONS    Only take over-the-counter or prescription medicines for pain, discomfort, or fever as directed by your caregiver.   If you were prescribed antibiotics, take them as directed. Finish them even if you start to feel better.   If gauze is used, follow your caregiver's directions for changing the gauze.   To avoid spreading the infection:   Keep your draining abscess covered with a  bandage.   Wash your hands well.   Do not share personal care items, towels, or whirlpools with others.   Avoid skin contact with others.   Keep your skin and clothes clean around the abscess.   Keep all follow-up appointments as directed by your caregiver.  SEEK MEDICAL CARE IF:    You have increased pain, swelling, redness, fluid drainage, or bleeding.   You have muscle aches, chills, or a general ill feeling.   You have a fever.  MAKE SURE YOU:    Understand these instructions.   Will watch your condition.   Will get help right away if you are not doing well or get worse.  Document Released: 09/20/2005 Document Revised: 06/11/2012 Document Reviewed: 02/23/2012 [employer] Patient Information 2013 [employer].    

## 2012-12-06 NOTE — Progress Notes (Signed)
Patient ID: Heather [name] MRN: [id number], DOB: 1961/05/26, 51 y.o. Date of Encounter: 12/06/2012, 1:02 PM    PROCEDURE NOTE: Verbal consent obtained. Local anesthesia obtained with 2% lidocaine plain.  1 cm incision made with 11 blade along lesion.  Culture taken. Moderate amount of purulence expressed. Lesion explored revealing no loculations. Packed with 1/4 plain packing. Dressed. Wound care instructions including precautions with patient. Patient tolerated the procedure well. Recheck in 48 hours.      Grier Mitts, PA-C 12/06/2012 1:02 PM

## 2012-12-08 ENCOUNTER — Encounter: Payer: Self-pay | Admitting: Physician Assistant

## 2012-12-08 ENCOUNTER — Ambulatory Visit (INDEPENDENT_AMBULATORY_CARE_PROVIDER_SITE_OTHER): Payer: PRIVATE HEALTH INSURANCE | Admitting: Physician Assistant

## 2012-12-08 VITALS — BP 102/64 | HR 83 | Temp 98.1°F | Resp 18 | Ht <= 58 in | Wt 128.0 lb

## 2012-12-08 DIAGNOSIS — L02419 Cutaneous abscess of limb, unspecified: Secondary | ICD-10-CM

## 2012-12-08 DIAGNOSIS — IMO0002 Reserved for concepts with insufficient information to code with codable children: Secondary | ICD-10-CM

## 2012-12-08 NOTE — Progress Notes (Signed)
  Subjective:    Patient ID: Heather [name], female    DOB: 05-Apr-1961, 51 y.o.   MRN: [id number]  HPI   Heather [name] is a very pleasant 51 yr old female here for recheck of abscess in the right axilla that was I&D'd two days ago.  States she is doing well.  Still has some soreness in the axilla.  Tolerating the doxycycline well.  Denies fever, chills, nausea, or vomiting.  Has done a few warm compresses.  Dressing has been changed 1 time with minimal drainage on the bandage.    Previous note reviewed.  Review of Systems  All other systems reviewed and are negative.       Objective:   Physical Exam  Vitals reviewed. Constitutional: She is oriented to person, place, and time. She appears well-developed and well-nourished. No distress.  HENT:  Head: Normocephalic and atraumatic.  Cardiovascular: Normal rate, regular rhythm and normal heart sounds.   Pulmonary/Chest: Effort normal and breath sounds normal. She has no wheezes. She has no rales.  Neurological: She is alert and oriented to person, place, and time.  Skin: Skin is warm and dry.       Wound right axilla; minimal surrounding erythema and induration  Psychiatric: She has a normal mood and affect. Her behavior is normal.     Filed Vitals:   12/08/12 1533  BP: 102/64  Pulse: 83  Temp: 98.1 F (36.7 C)  Resp: 18     Wound Care: Dressing and packing removed.  Packing saturated with purulent material.  Unable to express any further drainage from the wound.  Irrigated with 5cc plain lidocaine.  Repacked with 1/4" packing and dressed.      Assessment & Plan:   1. Abscess, axilla     Heather [name] is a very pleasant 51 yr old female here for recheck of abscess drained two days ago.  The area appears to be healing well.  I have repacked the wound as there was still considerable open space.  Sensitivities still pending.  Continue doxycycline.  Fast pass given for recheck on 12/10/12.

## 2012-12-09 LAB — WOUND CULTURE
Gram Stain: NONE SEEN
Gram Stain: NONE SEEN

## 2012-12-10 ENCOUNTER — Ambulatory Visit (INDEPENDENT_AMBULATORY_CARE_PROVIDER_SITE_OTHER): Payer: PRIVATE HEALTH INSURANCE | Admitting: Physician Assistant

## 2012-12-10 VITALS — BP 112/74 | HR 67 | Temp 98.4°F | Resp 18 | Ht <= 58 in | Wt 129.0 lb

## 2012-12-10 DIAGNOSIS — IMO0002 Reserved for concepts with insufficient information to code with codable children: Secondary | ICD-10-CM

## 2012-12-10 DIAGNOSIS — L02419 Cutaneous abscess of limb, unspecified: Secondary | ICD-10-CM

## 2012-12-10 NOTE — Progress Notes (Signed)
Patient ID: Heather [name] MRN: [id number], DOB: May 04, 1961 51 y.o. Date of Encounter: 12/10/2012, 4:14 PM  Chief Complaint: Wound care   See previous note  HPI: 51 y.o. y/o female presents for wound care s/p I&D on 12/08/12. Doing well No issues or complaints Afebrile/ no chills No nausea or vomiting Tolerating doxycycline. Pain improved.  Daily dressing change Previous note reviewed  Past Medical History  Diagnosis Date  . Mental retardation     history restored after error with record merge      . Intestinal obstruction     as an infant history restored after error with record merge      . Arthritis     history restored after error with record merge      . Anxiety     history restored after error with record merge      . Fatty infiltration of liver     history restored after error with record merge         Home Meds: Prior to Admission medications   Medication Sig Start Date End Date Taking? Authorizing Provider  bismuth subsalicylate (PEPTO BISMOL) 262 MG/15ML suspension Take 15 mLs by mouth every 6 (six) hours as needed. For upset stomach   Yes Historical Provider, MD  ciprofloxacin (CIPRO) 500 MG tablet Take 1 tablet (500 mg total) by mouth 2 (two) times daily. 09/14/12  Yes Tiffany Irine Seal, PA  doxycycline (VIBRAMYCIN) 100 MG capsule Take 1 capsule (100 mg total) by mouth 2 (two) times daily. 12/06/12  Yes Collene Gobble, MD  HYDROcodone-acetaminophen (VICODIN) 5-500 MG per tablet Take 1-2 tablets by mouth every 6 (six) hours as needed for pain. 09/14/12  Yes Tiffany Irine Seal, PA  levofloxacin (LEVAQUIN) 500 MG tablet Take 1 tablet (500 mg total) by mouth daily. 09/28/12  Yes Elvina Sidle, MD  Naproxen Sodium (ALEVE PO) Take 1 tablet by mouth daily as needed. For arthritis pain   Yes Historical Provider, MD  PARoxetine (PAXIL) 30 MG tablet Take twice a week. 10/09/12  Yes Elvina Sidle, MD    Allergies: No Known Allergies  ROS: Constitutional: Afebrile, no  chills Cardiovascular: negative for chest pain or palpitations Dermatological: Positive for wound. Negative for erythema, pain, or warmth. GI: No nausea or vomiting   EXAM: Physical Exam: Blood pressure 112/74, pulse 67, temperature 98.4 F (36.9 C), temperature source Oral, resp. rate 18, height 4\' 10"  (1.473 m), weight 129 lb (58.514 kg), SpO2 100.00%., Body mass index is 26.96 kg/(m^2). General: Well developed, well nourished, in no acute distress. Nontoxic appearing. Head: Normocephalic, atraumatic, sclera non-icteric.  Neck: Supple. Lungs: Breathing is unlabored. Heart: Normal rate. Skin:  Warm and moist. Dressing and packing in place. No induration, erythema, or tenderness to palpation. Neuro: Alert and oriented X 3. Moves all extremities spontaneously. Normal gait.  Psych:  Responds to questions appropriately with a normal affect.       PROCEDURE: Dressing and packing removed. No purulence expressed. Minimal purulence noted on packing. Wound bed healthy Irrigated with 2% plain lidocaine 5 cc. Repacked with 1/4 plain packing. Dressing applied  LAB: Culture: MRSA  A/P: 51 y.o. y/o female with axillary cellulitis/abscess as above s/p I&D on 12/08/12. Wound care per above Continue doxycycline. Pain well controlled Daily dressing changes Recheck 48 hours unless packing falls out spontaneously, then follow up if symptoms fail to improve.   Grier Mitts, PA-C 12/10/2012 4:14 PM

## 2013-01-07 ENCOUNTER — Ambulatory Visit (INDEPENDENT_AMBULATORY_CARE_PROVIDER_SITE_OTHER): Payer: PRIVATE HEALTH INSURANCE | Admitting: Family Medicine

## 2013-01-07 VITALS — BP 102/69 | HR 67 | Temp 98.0°F | Resp 17 | Ht 59.0 in | Wt 128.0 lb

## 2013-01-07 DIAGNOSIS — L739 Follicular disorder, unspecified: Secondary | ICD-10-CM

## 2013-01-07 DIAGNOSIS — L738 Other specified follicular disorders: Secondary | ICD-10-CM

## 2013-01-07 MED ORDER — DOXYCYCLINE HYCLATE 100 MG PO TABS: 100.0000 mg | ORAL_TABLET | Freq: Two times a day (BID) | ORAL | Status: DC

## 2013-01-07 NOTE — Progress Notes (Signed)
Subjective:    Patient ID: Heather [name], female    DOB: Jan 19, 1961, 52 y.o.   MRN: [id number]  HPI Heather [name] is a 52 y.o. female  Few weeks ago - had boil under R armpit - treated with i and D and doxycycline on 12/06/12.   Noticed area under L armpit few days ago - smaller than other side itches.   No attempted treatments..  Dial soap, unknown deodorant. No recent changes.   Review of Systems  Constitutional: Negative for fever and chills.  Skin: Negative for rash.       Left axilla lump.        Objective:   Physical Exam  Constitutional: She is oriented to person, place, and time. She appears well-developed and well-nourished. No distress.  Pulmonary/Chest: Effort normal.  Musculoskeletal: Normal range of motion.  Neurological: She is alert and oriented to person, place, and time.  Skin: Skin is warm and dry.          No cervical or axillary LAD.   Psychiatric: She has a normal mood and affect. Her behavior is normal.          Assessment & Plan:  Heather [name] is a 52 y.o. female 1. Folliculitis  doxycycline (VIBRA-TABS) 100 MG tablet   Axillary, with hx of MRSA requiring I and D on contralateral side last month. Not ready for I and D today, but plan to recheck in 3 days.  Warm compresses, home care reviewed as below. rtc precautions.   Patient Instructions  Warm compresses to area atleast 5-6 times per day and gentle pressure to bump.  Start antibiotic, and recheck in 3 days (after 3pm)  if not much improved. Return to the clinic or go to the nearest emergency room if any of your symptoms worsen or new symptoms occur. Avoid shaving and deodorant to area, and can try to switch soap to [place].  Folliculitis  Folliculitis is redness, soreness, and swelling (inflammation) of the hair follicles. This condition can occur anywhere on the body. People with weakened immune systems, diabetes, or obesity have a greater risk of getting  folliculitis. CAUSES  Bacterial infection. This is the most common cause.  Fungal infection.  Viral infection.  Contact with certain chemicals, especially oils and tars. Long-term folliculitis can result from bacteria that live in the nostrils. The bacteria may trigger multiple outbreaks of folliculitis over time. SYMPTOMS Folliculitis most commonly occurs on the scalp, thighs, legs, back, buttocks, and areas where hair is shaved frequently. An early sign of folliculitis is a small, white or yellow, pus-filled, itchy lesion (pustule). These lesions appear on a red, inflamed follicle. They are usually less than 0.2 inches (5 mm) wide. When there is an infection of the follicle that goes deeper, it becomes a boil or furuncle. A group of closely packed boils creates a larger lesion (carbuncle). Carbuncles tend to occur in hairy, sweaty areas of the body. DIAGNOSIS  Your caregiver can usually tell what is wrong by doing a physical exam. A sample may be taken from one of the lesions and tested in a lab. This can help determine what is causing your folliculitis. TREATMENT  Treatment may include:  Applying warm compresses to the affected areas.  Taking antibiotic medicines orally or applying them to the skin.  Draining the lesions if they contain a large amount of pus or fluid.  Laser hair removal for cases of long-lasting folliculitis. This helps to prevent regrowth of the hair. HOME  CARE INSTRUCTIONS  Apply warm compresses to the affected areas as directed by your caregiver.  If antibiotics are prescribed, take them as directed. Finish them even if you start to feel better.  You may take over-the-counter medicines to relieve itching.  Do not shave irritated skin.  Follow up with your caregiver as directed. SEEK IMMEDIATE MEDICAL CARE IF:   You have increasing redness, swelling, or pain in the affected area.  You have a fever. MAKE SURE YOU:  Understand these  instructions.  Will watch your condition.  Will get help right away if you are not doing well or get worse. Document Released: 02/19/2002 Document Revised: 06/11/2012 Document Reviewed: 03/12/2012 [facility] Patient Information 2013 [place], [place].

## 2013-01-07 NOTE — Patient Instructions (Addendum)
Warm compresses to area atleast 5-6 times per day and gentle pressure to bump.  Start antibiotic, and recheck in 3 days (after 3pm)  if not much improved. Return to the clinic or go to the nearest emergency room if any of your symptoms worsen or new symptoms occur. Avoid shaving and deodorant to area, and can try to switch soap to [place].  Folliculitis  Folliculitis is redness, soreness, and swelling (inflammation) of the hair follicles. This condition can occur anywhere on the body. People with weakened immune systems, diabetes, or obesity have a greater risk of getting folliculitis. CAUSES  Bacterial infection. This is the most common cause.  Fungal infection.  Viral infection.  Contact with certain chemicals, especially oils and tars. Long-term folliculitis can result from bacteria that live in the nostrils. The bacteria may trigger multiple outbreaks of folliculitis over time. SYMPTOMS Folliculitis most commonly occurs on the scalp, thighs, legs, back, buttocks, and areas where hair is shaved frequently. An early sign of folliculitis is a small, white or yellow, pus-filled, itchy lesion (pustule). These lesions appear on a red, inflamed follicle. They are usually less than 0.2 inches (5 mm) wide. When there is an infection of the follicle that goes deeper, it becomes a boil or furuncle. A group of closely packed boils creates a larger lesion (carbuncle). Carbuncles tend to occur in hairy, sweaty areas of the body. DIAGNOSIS  Your caregiver can usually tell what is wrong by doing a physical exam. A sample may be taken from one of the lesions and tested in a lab. This can help determine what is causing your folliculitis. TREATMENT  Treatment may include:  Applying warm compresses to the affected areas.  Taking antibiotic medicines orally or applying them to the skin.  Draining the lesions if they contain a large amount of pus or fluid.  Laser hair removal for cases of long-lasting  folliculitis. This helps to prevent regrowth of the hair. HOME CARE INSTRUCTIONS  Apply warm compresses to the affected areas as directed by your caregiver.  If antibiotics are prescribed, take them as directed. Finish them even if you start to feel better.  You may take over-the-counter medicines to relieve itching.  Do not shave irritated skin.  Follow up with your caregiver as directed. SEEK IMMEDIATE MEDICAL CARE IF:   You have increasing redness, swelling, or pain in the affected area.  You have a fever. MAKE SURE YOU:  Understand these instructions.  Will watch your condition.  Will get help right away if you are not doing well or get worse. Document Released: 02/19/2002 Document Revised: 06/11/2012 Document Reviewed: 03/12/2012 [facility] Patient Information 2013 [place], [place].

## 2013-01-10 ENCOUNTER — Ambulatory Visit (INDEPENDENT_AMBULATORY_CARE_PROVIDER_SITE_OTHER): Payer: PRIVATE HEALTH INSURANCE | Admitting: Family Medicine

## 2013-01-10 VITALS — BP 103/63 | HR 80 | Temp 98.3°F | Resp 16 | Ht <= 58 in | Wt 131.0 lb

## 2013-01-10 DIAGNOSIS — L739 Follicular disorder, unspecified: Secondary | ICD-10-CM

## 2013-01-10 DIAGNOSIS — L738 Other specified follicular disorders: Secondary | ICD-10-CM

## 2013-01-10 NOTE — Progress Notes (Signed)
Subjective:    Patient ID: Heather [name], female    DOB: [birth year], 52 y.o.   MRN: [id number]  HPI Heather [name] is a 52 y.o. female  See ov 3 days ago - L axillary folliculits, with hx of MRSA requiring I and D on contralateral side last month. Not ready for I and D then. Rx  warm compresses, home care and started on doxycycline.    No fever, feels like it is getting a little better.  Has been applying compresses, not sore. Feels overall better.  3 times per day compresses. No new side effects of antibiotics. Rare headache - relieved with tylenol.    Review of Systems As above. No fever/chills or new rash.     Objective:   Physical Exam  Vitals reviewed. Constitutional: She appears well-developed and well-nourished.  Pulmonary/Chest: Effort normal.  Skin: Skin is warm and dry.     Psychiatric: She has a normal mood and affect. Her behavior is normal.        Assessment & Plan:  Heather [name] is a 52 y.o. female 1. Folliculitis    L axilla - improving.  No indication for I and D at present .  Continue home care and recheck in few days if needed.  rtc precautions discussed and understanding expressed.  Patient Instructions  Continue warm compresses and antibiotic.  Recheck in 3-4 days if not continuing to improve. . Return to the clinic or go to the nearest emergency room if any of your symptoms worsen or new symptoms occur.  Folliculitis  Folliculitis is redness, soreness, and swelling (inflammation) of the hair follicles. This condition can occur anywhere on the body. People with weakened immune systems, diabetes, or obesity have a greater risk of getting folliculitis. CAUSES  Bacterial infection. This is the most common cause.  Fungal infection.  Viral infection.  Contact with certain chemicals, especially oils and tars. Long-term folliculitis can result from bacteria that live in the nostrils. The bacteria may trigger multiple outbreaks of folliculitis  over time. SYMPTOMS Folliculitis most commonly occurs on the scalp, thighs, legs, back, buttocks, and areas where hair is shaved frequently. An early sign of folliculitis is a small, white or yellow, pus-filled, itchy lesion (pustule). These lesions appear on a red, inflamed follicle. They are usually less than 0.2 inches (5 mm) wide. When there is an infection of the follicle that goes deeper, it becomes a boil or furuncle. A group of closely packed boils creates a larger lesion (carbuncle). Carbuncles tend to occur in hairy, sweaty areas of the body. DIAGNOSIS  Your caregiver can usually tell what is wrong by doing a physical exam. A sample may be taken from one of the lesions and tested in a lab. This can help determine what is causing your folliculitis. TREATMENT  Treatment may include:  Applying warm compresses to the affected areas.  Taking antibiotic medicines orally or applying them to the skin.  Draining the lesions if they contain a large amount of pus or fluid.  Laser hair removal for cases of long-lasting folliculitis. This helps to prevent regrowth of the hair. HOME CARE INSTRUCTIONS  Apply warm compresses to the affected areas as directed by your caregiver.  If antibiotics are prescribed, take them as directed. Finish them even if you start to feel better.  You may take over-the-counter medicines to relieve itching.  Do not shave irritated skin.  Follow up with your caregiver as directed. SEEK IMMEDIATE MEDICAL CARE IF:  You have increasing redness, swelling, or pain in the affected area.  You have a fever. MAKE SURE YOU:  Understand these instructions.  Will watch your condition.  Will get help right away if you are not doing well or get worse. Document Released: 02/19/2002 Document Revised: 06/11/2012 Document Reviewed: 03/12/2012 [facility] Patient Information 2013 [place], [place].

## 2013-01-10 NOTE — Patient Instructions (Addendum)
Continue warm compresses and antibiotic.  Recheck in 3-4 days if not continuing to improve. . Return to the clinic or go to the nearest emergency room if any of your symptoms worsen or new symptoms occur.  Folliculitis  Folliculitis is redness, soreness, and swelling (inflammation) of the hair follicles. This condition can occur anywhere on the body. People with weakened immune systems, diabetes, or obesity have a greater risk of getting folliculitis. CAUSES  Bacterial infection. This is the most common cause.  Fungal infection.  Viral infection.  Contact with certain chemicals, especially oils and tars. Long-term folliculitis can result from bacteria that live in the nostrils. The bacteria may trigger multiple outbreaks of folliculitis over time. SYMPTOMS Folliculitis most commonly occurs on the scalp, thighs, legs, back, buttocks, and areas where hair is shaved frequently. An early sign of folliculitis is a small, white or yellow, pus-filled, itchy lesion (pustule). These lesions appear on a red, inflamed follicle. They are usually less than 0.2 inches (5 mm) wide. When there is an infection of the follicle that goes deeper, it becomes a boil or furuncle. A group of closely packed boils creates a larger lesion (carbuncle). Carbuncles tend to occur in hairy, sweaty areas of the body. DIAGNOSIS  Your caregiver can usually tell what is wrong by doing a physical exam. A sample may be taken from one of the lesions and tested in a lab. This can help determine what is causing your folliculitis. TREATMENT  Treatment may include:  Applying warm compresses to the affected areas.  Taking antibiotic medicines orally or applying them to the skin.  Draining the lesions if they contain a large amount of pus or fluid.  Laser hair removal for cases of long-lasting folliculitis. This helps to prevent regrowth of the hair. HOME CARE INSTRUCTIONS  Apply warm compresses to the affected areas as directed  by your caregiver.  If antibiotics are prescribed, take them as directed. Finish them even if you start to feel better.  You may take over-the-counter medicines to relieve itching.  Do not shave irritated skin.  Follow up with your caregiver as directed. SEEK IMMEDIATE MEDICAL CARE IF:   You have increasing redness, swelling, or pain in the affected area.  You have a fever. MAKE SURE YOU:  Understand these instructions.  Will watch your condition.  Will get help right away if you are not doing well or get worse. Document Released: 02/19/2002 Document Revised: 06/11/2012 Document Reviewed: 03/12/2012 [facility] Patient Information 2013 [place], [place].

## 2013-03-06 ENCOUNTER — Encounter: Payer: Self-pay | Admitting: Family Medicine

## 2013-03-06 ENCOUNTER — Ambulatory Visit (INDEPENDENT_AMBULATORY_CARE_PROVIDER_SITE_OTHER): Payer: PRIVATE HEALTH INSURANCE | Admitting: Family Medicine

## 2013-03-06 VITALS — BP 102/60 | HR 97 | Temp 99.0°F | Resp 16 | Ht 59.0 in | Wt 131.0 lb

## 2013-03-06 DIAGNOSIS — F419 Anxiety disorder, unspecified: Secondary | ICD-10-CM

## 2013-03-06 DIAGNOSIS — G252 Other specified forms of tremor: Secondary | ICD-10-CM

## 2013-03-06 DIAGNOSIS — G250 Essential tremor: Secondary | ICD-10-CM

## 2013-03-06 DIAGNOSIS — F411 Generalized anxiety disorder: Secondary | ICD-10-CM

## 2013-03-06 MED ORDER — PAROXETINE HCL 30 MG PO TABS: ORAL_TABLET | ORAL | Status: DC

## 2013-03-06 NOTE — Progress Notes (Signed)
xMentally disabled woman brought in by mother for evaluation of tremor and review of the paroxetine.  She has been tapering the latter so that we may use tremor reducing medicine and she is now on one paroxetine twice a week.  The tremor has been improving as well to the point that she is back where she was a year ago.  Her weight has increased mildly and she will be working on exercise and diet control this spring.  [place] is going to work every day now and her spirits are good.  She is able to walk fairly well without the walker now, although she is using it today.  Her fused right knee is not giving her problems.  Objective:  Spent 20 minutes reviewing life care issues, tremor. Heart:  Regular without murmur Lungs clear Right leg fused with antalgic but stable gain. The tremor is very fine and almost unnoticeable now.  Assessment:  Resolving bout of depression/anxiety.  Resolving Action Tremor  Plan:  Reduce paxil to 30 qweek for another 10 weeks, then discontinue.  If tremor remains a problem, we can consider a beta blocker or possibly Artane.

## 2013-03-06 NOTE — Patient Instructions (Addendum)
Take just one Paroxetine a week for next two months and then, if the tremor persists, we'll treat it then.

## 2013-04-01 ENCOUNTER — Telehealth: Payer: Self-pay

## 2013-04-01 NOTE — Telephone Encounter (Signed)
Heather [name] STATES THE DR IS TRYING TO WEAN HER DAUGHTER OFF HER MEDS, BUT NOW SHE IS HAVING HEADACHES AND STOMACH PAINS BECAUSE OF IT WOULD LIKE TO SPEAK WITH SOMEONE ABOUT IT. PLEASE CALL [phone]

## 2013-04-01 NOTE — Telephone Encounter (Signed)
Plan: Reduce paxil to 30 qweek for another 10 weeks, then discontinue. If tremor remains a problem, we can consider a beta blocker or possibly Artane.

## 2013-04-04 NOTE — Telephone Encounter (Signed)
Headaches and stomach pains started after reducing to one paxil per week.  The tremor persists, but the headaches and stomach pains are really more significant.  Therefore we will increase the Paxil to two per week of the 30 mgs.  Call if problems persist

## 2013-04-28 ENCOUNTER — Other Ambulatory Visit: Payer: Self-pay | Admitting: Family Medicine

## 2013-04-28 DIAGNOSIS — F419 Anxiety disorder, unspecified: Secondary | ICD-10-CM

## 2013-04-28 MED ORDER — PAROXETINE HCL 30 MG PO TABS: ORAL_TABLET | ORAL | Status: DC

## 2013-06-20 ENCOUNTER — Telehealth: Payer: Self-pay | Admitting: *Deleted

## 2013-06-20 ENCOUNTER — Ambulatory Visit (INDEPENDENT_AMBULATORY_CARE_PROVIDER_SITE_OTHER): Payer: PRIVATE HEALTH INSURANCE | Admitting: Family Medicine

## 2013-06-20 VITALS — BP 112/72 | HR 70 | Temp 98.1°F | Resp 16 | Ht 58.5 in | Wt 133.0 lb

## 2013-06-20 DIAGNOSIS — Z8614 Personal history of Methicillin resistant Staphylococcus aureus infection: Secondary | ICD-10-CM

## 2013-06-20 DIAGNOSIS — IMO0002 Reserved for concepts with insufficient information to code with codable children: Secondary | ICD-10-CM

## 2013-06-20 MED ORDER — MUPIROCIN 2 % EX OINT: TOPICAL_OINTMENT | Freq: Three times a day (TID) | CUTANEOUS | Status: DC

## 2013-06-20 MED ORDER — DOXYCYCLINE HYCLATE 100 MG PO TABS: 100.0000 mg | ORAL_TABLET | Freq: Two times a day (BID) | ORAL | Status: DC

## 2013-06-20 MED ORDER — MUPIROCIN CALCIUM 2 % NA OINT: TOPICAL_OINTMENT | Freq: Two times a day (BID) | NASAL | Status: DC

## 2013-06-20 NOTE — Patient Instructions (Addendum)
Start antibiotic pills today, ok to use mupirocin ointment to current and future outbreaks first few days ok, but should be seen if not improving in first few days. After antibiotic pills, start nasal ointment. Return to the clinic or go to the nearest emergency room if any of your symptoms worsen or new symptoms occur.  Cellulitis Cellulitis is an infection of the skin and the tissue beneath it. The infected area is usually red and tender. Cellulitis occurs most often in the arms and lower legs.  CAUSES  Cellulitis is caused by bacteria that enter the skin through cracks or cuts in the skin. The most common types of bacteria that cause cellulitis are Staphylococcus and Streptococcus. SYMPTOMS   Redness and warmth.  Swelling.  Tenderness or pain.  Fever. DIAGNOSIS  Your caregiver can usually determine what is wrong based on a physical exam. Blood tests may also be done. TREATMENT  Treatment usually involves taking an antibiotic medicine. HOME CARE INSTRUCTIONS   Take your antibiotics as directed. Finish them even if you start to feel better.  Keep the infected arm or leg elevated to reduce swelling.  Apply a warm cloth to the affected area up to 4 times per day to relieve pain.  Only take over-the-counter or prescription medicines for pain, discomfort, or fever as directed by your caregiver.  Keep all follow-up appointments as directed by your caregiver. SEEK MEDICAL CARE IF:   You notice red streaks coming from the infected area.  Your red area gets larger or turns dark in color.  Your bone or joint underneath the infected area becomes painful after the skin has healed.  Your infection returns in the same area or another area.  You notice a swollen bump in the infected area.  You develop new symptoms. SEEK IMMEDIATE MEDICAL CARE IF:   You have a fever.  You feel very sleepy.  You develop vomiting or diarrhea.  You have a general ill feeling (malaise) with muscle  aches and pains. MAKE SURE YOU:   Understand these instructions.  Will watch your condition.  Will get help right away if you are not doing well or get worse. Document Released: 09/20/2005 Document Revised: 06/11/2012 Document Reviewed: 02/26/2012 [facility] Patient Information 2014 [place], [place].

## 2013-06-20 NOTE — Progress Notes (Signed)
Subjective:    Patient ID: Heather [name], female    DOB: Dec 05, 1961, 52 y.o.   MRN: [id number]  HPI  Heather [name] is a 52 y.o. female  Red area on L elbow - noticed past few days.  Looks bigger today. No discharge. No fever. No treatments. Feels well otherwise.   Hx of MRSA folliculitis.     Review of Systems  Constitutional: Negative for fever and chills.  Skin: Positive for rash.       Objective:   Physical Exam  Vitals reviewed. Constitutional: She is oriented to person, place, and time. She appears well-developed and well-nourished. No distress.  Pulmonary/Chest: Effort normal.  Neurological: She is alert and oriented to person, place, and time.  Skin: Skin is warm and dry. There is erythema.     Psychiatric: She has a normal mood and affect. Her behavior is normal.       Assessment & Plan:  Heather [name] is a 52 y.o. female Cellulitis and abscess of upper arm and forearm - Plan: Wound culture, doxycycline (VIBRA-TABS) 100 MG tablet, mupirocin ointment (BACTROBAN) 2 %  Hx MRSA infection - Plan: mupirocin nasal ointment (BACTROBAN NASAL) 2 %  Early L forearm cellulitis with hx of MRSA. Small indurated area at present. Start doxy, bactroban topical (can also use at onset of any future folliculitis), warm compresses, and rtc precautions. Can use bactroban nasal after current treatment for possible MRSA colonization. rtc if not improving with above, or if enlarging  - may need I/D.   Patient Instructions  Start antibiotic pills today, ok to use mupirocin ointment to current and future outbreaks first few days ok, but should be seen if not improving in first few days. After antibiotic pills, start nasal ointment. Return to the clinic or go to the nearest emergency room if any of your symptoms worsen or new symptoms occur.  Cellulitis Cellulitis is an infection of the skin and the tissue beneath it. The infected area is usually red and tender. Cellulitis occurs  most often in the arms and lower legs.  CAUSES  Cellulitis is caused by bacteria that enter the skin through cracks or cuts in the skin. The most common types of bacteria that cause cellulitis are Staphylococcus and Streptococcus. SYMPTOMS   Redness and warmth.  Swelling.  Tenderness or pain.  Fever. DIAGNOSIS  Your caregiver can usually determine what is wrong based on a physical exam. Blood tests may also be done. TREATMENT  Treatment usually involves taking an antibiotic medicine. HOME CARE INSTRUCTIONS   Take your antibiotics as directed. Finish them even if you start to feel better.  Keep the infected arm or leg elevated to reduce swelling.  Apply a warm cloth to the affected area up to 4 times per day to relieve pain.  Only take over-the-counter or prescription medicines for pain, discomfort, or fever as directed by your caregiver.  Keep all follow-up appointments as directed by your caregiver. SEEK MEDICAL CARE IF:   You notice red streaks coming from the infected area.  Your red area gets larger or turns dark in color.  Your bone or joint underneath the infected area becomes painful after the skin has healed.  Your infection returns in the same area or another area.  You notice a swollen bump in the infected area.  You develop new symptoms. SEEK IMMEDIATE MEDICAL CARE IF:   You have a fever.  You feel very sleepy.  You develop vomiting or diarrhea.  You  have a general ill feeling (malaise) with muscle aches and pains. MAKE SURE YOU:   Understand these instructions.  Will watch your condition.  Will get help right away if you are not doing well or get worse. Document Released: 09/20/2005 Document Revised: 06/11/2012 Document Reviewed: 02/26/2012 [facility] Patient Information 2014 [place], [place].

## 2013-06-20 NOTE — Telephone Encounter (Signed)
Called mother regarding medicine.

## 2013-06-22 LAB — WOUND CULTURE
Gram Stain: NONE SEEN
Gram Stain: NONE SEEN

## 2013-08-20 ENCOUNTER — Telehealth: Payer: Self-pay

## 2013-08-20 ENCOUNTER — Ambulatory Visit (INDEPENDENT_AMBULATORY_CARE_PROVIDER_SITE_OTHER): Payer: PRIVATE HEALTH INSURANCE | Admitting: Family Medicine

## 2013-08-20 ENCOUNTER — Ambulatory Visit: Payer: PRIVATE HEALTH INSURANCE

## 2013-08-20 VITALS — BP 110/70 | HR 73 | Temp 98.8°F | Resp 20 | Ht 59.0 in | Wt 137.4 lb

## 2013-08-20 DIAGNOSIS — R51 Headache: Secondary | ICD-10-CM

## 2013-08-20 NOTE — Progress Notes (Signed)
Is a 52 year old woman was brought in by her mother because she fell out of bed 2 and half weeks prior to this visit. She apparently hit the left side of her head and had some black and blue area there for while which is resolved. She was seen by the eye doctor today who verified that there were no visual changes. She does have small cataracts.  Patient says that she's had some congestion but no consistent rhinorrhea. She's had no epistaxis. She has no ear pain.  Objective: No acute distress, normal personality, alert and moving head and neck normally. Neck: Supple without adenopathy or tenderness Palpation of the cranium reveals no defects. There is no battle sign. TMs normal Oropharynx clear Nose: No obvious rhinorrhea. Neuro exam: Intact motor, mental status, cranial nerves [facility] reading (PRIMARY) by  Dr. Milus Glazier Sinus views: normal  Assessment: Posttraumatic headache without signs of neurological injury. Patient needs TLC for most of her injuries so I think this can follow the same strategy.  Plan: Discussed with mother to use Aleve when necessary and return if headache worsens or persists more than a week  Signed, Elvina Sidle M.D.  .

## 2013-08-20 NOTE — Telephone Encounter (Signed)
Mathis Fare, a PA with [facility], saw patient today and states that although patient has no neurological eye issues he recommends her having a CT scan. Patient fell out of bed a few days ago. Patient only wants to see Dr. Milus Glazier and will come in over the weekend.

## 2014-04-02 ENCOUNTER — Ambulatory Visit (INDEPENDENT_AMBULATORY_CARE_PROVIDER_SITE_OTHER): Payer: PRIVATE HEALTH INSURANCE | Admitting: Family Medicine

## 2014-04-02 ENCOUNTER — Encounter: Payer: Self-pay | Admitting: Family Medicine

## 2014-04-02 VITALS — BP 110/70 | HR 80 | Temp 99.0°F | Resp 16 | Ht <= 58 in | Wt 149.4 lb

## 2014-04-02 DIAGNOSIS — F411 Generalized anxiety disorder: Secondary | ICD-10-CM

## 2014-04-02 DIAGNOSIS — F419 Anxiety disorder, unspecified: Secondary | ICD-10-CM

## 2014-04-02 DIAGNOSIS — R51 Headache: Secondary | ICD-10-CM

## 2014-04-02 MED ORDER — PAROXETINE HCL 30 MG PO TABS: ORAL_TABLET | ORAL | Status: DC

## 2014-04-02 NOTE — Progress Notes (Signed)
Subjective:  This chart was scribed for Heather Haber, MD by Heather [name], Medical Scribe. This patient was seen in Room 21 and the patient's care was started at 11:33 AM.   Patient ID: Heather [name], female    DOB: 10-May-1961, 53 y.o.   MRN: [id number]  HPI HPI Comments: Heather [name] is a 53 y.o. female who presents to the [facility] for review of her current situation and some headache she's been suffering from recently..  The patient states that the Paxil has been giving her headaches.  She states that she takes her medication on Wednesday and Sunday every week in the morning with her coffee.  [name] bother states that the patient has been in more control of her emotions and does not cry as often.    She states that her legs are still hurting.  She states that she has been having mild periumbilical abdominal pain.    The patient is currently living with her brother in [place].  He states that she is trying to enroll her in an assisted living community.     Past Medical History  Diagnosis Date   Mental retardation     history restored after error with record merge       Intestinal obstruction     as an infant history restored after error with record merge       Arthritis     history restored after error with record merge       Anxiety     history restored after error with record merge       Fatty infiltration of liver     history restored after error with record merge       Past Surgical History  Procedure Laterality Date   Knee dislocation surgery     Abdominal hysterectomy     Colonoscopy     Polypectomy     Sigmoidoscopy     Fused knee     Family History  Problem Relation Age of Onset   Diabetes Mother     history restored after error with record merge       History   Social History   Marital Status: Single    Spouse Name: N/A    Number of Children: N/A   Years of Education: N/A   Occupational History   Not  on file.   Social History Main Topics   Smoking status: Never Smoker    Smokeless tobacco: Not on file   Alcohol Use: Not on file   Drug Use: Not on file   Sexual Activity: Not on file   Other Topics Concern   Not on file   Social History Narrative   ** Merged History Encounter **       No Known Allergies  Review of Systems  Gastrointestinal: Positive for abdominal pain.  Musculoskeletal: Positive for arthralgias (bilateral legs).  All other systems reviewed and are negative.    Objective:  Physical Exam  Nursing note and vitals reviewed. Constitutional: She is oriented to person, place, and time. She appears well-developed and well-nourished.  HENT:  Head: Normocephalic and atraumatic.  Eyes: EOM are normal.  Neck: Normal range of motion.  Cardiovascular: Normal rate.   Pulmonary/Chest: Effort normal.  Musculoskeletal: Normal range of motion.  Neurological: She is alert and oriented to person, place, and time.  Skin: Skin is warm and dry.  Psychiatric: She has a normal mood and affect. Her behavior is  normal.   Patient seems cheerful.   BP 110/70   Pulse 80   Temp(Src) 99 F (37.2 C) (Oral)   Resp 16   Ht 4\' 9"  (1.448 m)   Wt 149 lb 6.4 oz (67.767 kg)   BMI 32.32 kg/m2   SpO2 98% Assessment & Plan:    I personally performed the services described in this documentation, which was scribed in my presence. The recorded information has been reviewed and is accurate.  I've asked patient to decrease the Paxil to 30 mg twice a week to be taken at night to try to reduce the possibility of headache and dizziness meant.  Anxiety - Plan: PARoxetine (PAXIL) 30 MG tablet  Signed, Heather Haber, MD

## 2014-06-02 ENCOUNTER — Encounter: Payer: Self-pay | Admitting: Gastroenterology

## 2014-07-15 ENCOUNTER — Ambulatory Visit (INDEPENDENT_AMBULATORY_CARE_PROVIDER_SITE_OTHER): Payer: PRIVATE HEALTH INSURANCE | Admitting: Family Medicine

## 2014-07-15 VITALS — BP 114/78 | HR 70 | Temp 98.3°F | Resp 18 | Ht <= 58 in | Wt 154.2 lb

## 2014-07-15 DIAGNOSIS — L723 Sebaceous cyst: Secondary | ICD-10-CM

## 2014-07-15 DIAGNOSIS — J309 Allergic rhinitis, unspecified: Secondary | ICD-10-CM

## 2014-07-15 DIAGNOSIS — R51 Headache: Secondary | ICD-10-CM

## 2014-07-15 DIAGNOSIS — R2681 Unsteadiness on feet: Secondary | ICD-10-CM

## 2014-07-15 DIAGNOSIS — R269 Unspecified abnormalities of gait and mobility: Secondary | ICD-10-CM

## 2014-07-15 MED ORDER — MONTELUKAST SODIUM 10 MG PO TABS: 10.0000 mg | ORAL_TABLET | Freq: Every day | ORAL | Status: DC

## 2014-07-15 NOTE — Progress Notes (Addendum)
Heather chart was scribed for Heather Haber, MD by Einar Pheasant, ED Scribe. Heather [name] was seen in room 2 and the [name]'s care was started at 4:46 PM.  [name] ID: Heather [name] MRN: [id number], DOB: Feb 01, 1961, 53 y.o. Date of Encounter: 07/15/2014, 4:43 PM  Primary Physician: Heather Haber, MD  Chief Complaint:  Chief Complaint  [name] presents with   Cyst    Rt Armpit x maybe 1 month     HPI: 53 y.o. year old female with history below presents at [facility] today complaining of a knot under her right axilla that she first noticed approximately 1 month ago. Primary care giver that's that she started the pt on Vitamin D supplements. She is also requesting a prescription for a walker. Pt has a rod in her right leg which prevents her from having a normal gait. Care giver is also requesting a handicap parking permit. Pt also brought in paperwork for me to fill out regarding transportation.    Caregiver states that the pt has been complaining of headaches lately. She states that she has been giving her Ibuprofen with mild relief. She was concerned that the headaches may be due to the Paxil she has been taking or sinus congestion, but she is not sure. Pt states that she has stopped drinking a lot of caffeinated soda's. She states that she only drinks one cup of coffee in the morning. Heather [name] says she is trying to stop drinking the coffee as well. She is suspicious that the reduction in her caffeine may also be a cause of her headaches. Denies any fever, chills, nausea, emesis, abdominal pain, SOB, chest pain, or visual disturbances.    Past Medical History  Diagnosis Date   Mental retardation     history restored after error with record merge       Intestinal obstruction     as an infant history restored after error with record merge       Arthritis     history restored after error with record merge       Anxiety     history restored after error with record merge        Fatty infiltration of liver     history restored after error with record merge         Home Meds: Prior to Admission medications   Medication Sig Start Date End Date Taking? Authorizing Provider  PARoxetine (PAXIL) 30 MG tablet Take twice a week. 04/02/14  Yes Heather Haber, MD    Allergies: No Known Allergies  History   Social History   Marital Status: Single    Spouse Name: N/A    Number of Children: N/A   Years of Education: N/A   Occupational History   Not on file.   Social History Main Topics   Smoking status: Never Smoker    Smokeless tobacco: Not on file   Alcohol Use: Not on file   Drug Use: Not on file   Sexual Activity: Not on file   Other Topics Concern   Not on file   Social History Narrative   ** Merged History Encounter **         Review of Systems: Right knee fusion Constitutional: negative for chills, fever, night sweats, weight changes, or fatigue  HEENT: negative for vision changes, hearing loss, congestion, rhinorrhea, ST, epistaxis, or sinus pressure Cardiovascular: negative for chest pain or palpitations Respiratory: negative for hemoptysis, wheezing, shortness of breath, or cough Abdominal: negative for  abdominal pain, nausea, vomiting, diarrhea, or constipation Dermatological: negative for rash Neurologic: negative for headache, dizziness, or syncope All other systems reviewed and are otherwise negative with the exception to those above and in the HPI.   Physical Exam: Blood pressure 114/78, pulse 70, temperature 98.3 F (36.8 C), temperature source Oral, resp. rate 18, height 4' 9.5" (1.461 m), weight 154 lb 3.2 oz (69.945 kg), SpO2 98.00%., Body mass index is 32.77 kg/(m^2). General: Well developed, well nourished, in no acute distress. Head: Normocephalic, atraumatic, eyes without discharge, sclera non-icteric, nares are without discharge. Bilateral auditory canals clear, TM's are without perforation, pearly grey and translucent  with reflective cone of light bilaterally. Oral cavity moist, posterior pharynx without exudate, erythema, peritonsillar abscess, or post nasal drip. Right Axilla: bump noted in the skin under right axilla consistent with a blocked pore.  Neck: Supple. No thyromegaly. Full ROM. No lymphadenopathy. Lungs: Clear bilaterally to auscultation without wheezes, rales, or rhonchi. Breathing is unlabored. Heart: RRR with S1 S2. No murmurs, rubs, or gallops appreciated. Abdomen: Soft, non-tender, non-distended with normoactive bowel sounds. No hepatomegaly. No rebound/guarding. No obvious abdominal masses. Msk:  Strength and tone normal for age. Extremities/Skin: Warm and dry. No clubbing or cyanosis. No edema. No rashes or suspicious lesions. She does have a very small 3 mm right axillary sebaceous cyst which is intradermal and without any erythema or adenopathy. Neuro: Alert and oriented X 3. Moves all extremities spontaneously. Gait is normal. CNII-XII grossly in tact. Psych:  Responds to questions appropriately with a normal affect.  Handicap sticker form completed SCAT application completed  Heather [name] is mentally handicapped. She has mental retardation and requires some assistance or her ADLs and decision-making. ASSESSMENT AND PLAN:  53 y.o. year old female with Allergic rhinitis, unspecified allergic rhinitis type - Plan: montelukast (SINGULAIR) 10 MG tablet  Headache(784.0)  Unstable gait  Sebaceous cyst      I personally performed the services described in Heather documentation, which was scribed in my presence. The recorded information has been reviewed and is accurate.  Signed, Heather Haber, MD 07/15/2014 4:43 PM

## 2014-08-06 ENCOUNTER — Telehealth: Payer: Self-pay

## 2014-08-06 DIAGNOSIS — F330 Major depressive disorder, recurrent, mild: Secondary | ICD-10-CM

## 2014-08-06 NOTE — Telephone Encounter (Signed)
Heather [name] is calling to ask dr Joseph Art to write a referral to dr Barbaraann Share lampren welker for a update psychiatric evaluation  Fax number is [phone]

## 2014-08-06 NOTE — Telephone Encounter (Signed)
Pended referral please advise.  

## 2014-08-22 ENCOUNTER — Ambulatory Visit (INDEPENDENT_AMBULATORY_CARE_PROVIDER_SITE_OTHER): Payer: PRIVATE HEALTH INSURANCE | Admitting: Internal Medicine

## 2014-08-22 VITALS — BP 118/62 | HR 87 | Temp 98.8°F | Resp 14 | Ht <= 58 in | Wt 156.2 lb

## 2014-08-22 DIAGNOSIS — M79609 Pain in unspecified limb: Secondary | ICD-10-CM

## 2014-08-22 DIAGNOSIS — M79605 Pain in left leg: Secondary | ICD-10-CM

## 2014-08-22 DIAGNOSIS — R21 Rash and other nonspecific skin eruption: Secondary | ICD-10-CM

## 2014-08-22 DIAGNOSIS — L299 Pruritus, unspecified: Secondary | ICD-10-CM

## 2014-08-22 LAB — POCT CBC
Granulocyte percent: 68.5 %G (ref 37–80)
HCT, POC: 40.6 % (ref 37.7–47.9)
Hemoglobin: 13 g/dL (ref 12.2–16.2)
Lymph, poc: 1.6 (ref 0.6–3.4)
MCH, POC: 30.9 pg (ref 27–31.2)
MCHC: 32 g/dL (ref 31.8–35.4)
MCV: 96.5 fL (ref 80–97)
MID (cbc): 0.3 (ref 0–0.9)
MPV: 9.5 fL (ref 0–99.8)
POC Granulocyte: 4.1 (ref 2–6.9)
POC LYMPH PERCENT: 27.2 %L (ref 10–50)
POC MID %: 4.3 %M (ref 0–12)
Platelet Count, POC: 163 10*3/uL (ref 142–424)
RBC: 4.21 M/uL (ref 4.04–5.48)
RDW, POC: 13.6 %
WBC: 6 10*3/uL (ref 4.6–10.2)

## 2014-08-22 LAB — POCT SEDIMENTATION RATE: POCT SED RATE: 22 mm/hr (ref 0–22)

## 2014-08-22 MED ORDER — VALACYCLOVIR HCL 1 G PO TABS: 1000.0000 mg | ORAL_TABLET | Freq: Three times a day (TID) | ORAL | Status: DC

## 2014-08-22 MED ORDER — CLOBETASOL PROPIONATE 0.05 % EX OINT: 1.0000 "application " | TOPICAL_OINTMENT | Freq: Two times a day (BID) | CUTANEOUS | Status: DC

## 2014-08-22 NOTE — Progress Notes (Signed)
   Subjective:    Patient ID: Heather [name], female    DOB: Feb 10, 1961, 53 y.o.   MRN: [id number]  HPI    Review of Systems     Objective:   Physical Exam        Assessment & Plan:

## 2014-08-22 NOTE — Progress Notes (Signed)
   Subjective:    Patient ID: Heather [name], female    DOB: 1961-05-17, 53 y.o.   MRN: [id number]  HPI  Patient was being seen at [facility] for her rash. Patient first noticed on Thursday (3 days ago). The rash on left thigh. It itches and burns. Red in color. Worst at night. Patient stated she did use Dove soap at one time then noticed the rash. No new food. No new medication. Did take new vitamins for a month. Only taken Benadryl for 2 night ago. Using otc creams., neosporin. Phx of MRSA There is also a lump under the right arm. Which was also seen by Dr. Joseph [name]. Has just moved in with her friend Heather [name]. Review of Systems Has disabilities    Objective:   Physical Exam  Constitutional: She is oriented to person, place, and time. She appears well-developed and well-nourished. No distress.  HENT:  Head: Normocephalic and atraumatic.  Eyes: EOM are normal. Pupils are equal, round, and reactive to light.  Neck: Normal range of motion.  Pulmonary/Chest: Effort normal.  Neurological: She is alert and oriented to person, place, and time. No cranial nerve deficit. Coordination abnormal.  Skin: Skin is intact. Rash noted. Rash is maculopapular. Rash is not pustular, not vesicular and not urticarial. There is erythema.     Not infected sebaceous cyst right axilla Drained with pressure  Psychiatric: She has a normal mood and affect. Her speech is normal. Judgment normal. She is slowed.   Rash is not tender, no fever, only on left leg  Results for orders placed in visit on 06/20/13  WOUND CULTURE      Result Value Ref Range   Culture Few METHICILLIN RESISTANT STAPHYLOCOCCUS AUREUS     Gram Stain No WBC Seen     Gram Stain Rare Squamous Epithelial Cells Present     Gram Stain No Organisms Seen     Organism ID, Bacteria METHICILLIN RESISTANT STAPHYLOCOCCUS AUREUS     Results for orders placed in visit on 08/22/14  POCT CBC      Result Value Ref Range   WBC 6.0  4.6 - 10.2 K/uL   Lymph, poc 1.6  0.6 - 3.4   POC LYMPH PERCENT 27.2  10 - 50 %L   MID (cbc) 0.3  0 - 0.9   POC MID % 4.3  0 - 12 %M   POC Granulocyte 4.1  2 - 6.9   Granulocyte percent 68.5  37 - 80 %G   RBC 4.21  4.04 - 5.48 M/uL   Hemoglobin 13.0  12.2 - 16.2 g/dL   HCT, POC 40.6  37.7 - 47.9 %   MCV 96.5  80 - 97 fL   MCH, POC 30.9  27 - 31.2 pg   MCHC 32.0  31.8 - 35.4 g/dL   RDW, POC 13.6     Platelet Count, POC 163  142 - 424 K/uL   MPV 9.5  0 - 99.8 fL         Assessment & Plan:  Contact dermatitis most likely May be early shingles

## 2014-08-22 NOTE — Patient Instructions (Signed)
Contact Dermatitis Contact dermatitis is a reaction to certain substances that touch the skin. Contact dermatitis can be either irritant contact dermatitis or allergic contact dermatitis. Irritant contact dermatitis does not require previous exposure to the substance for a reaction to occur.Allergic contact dermatitis only occurs if you have been exposed to the substance before. Upon a repeat exposure, your body reacts to the substance.  CAUSES  Many substances can cause contact dermatitis. Irritant dermatitis is most commonly caused by repeated exposure to mildly irritating substances, such as:  Makeup.  Soaps.  Detergents.  Bleaches.  Acids.  Metal salts, such as nickel. Allergic contact dermatitis is most commonly caused by exposure to:  Poisonous plants.  Chemicals (deodorants, shampoos).  Jewelry.  Latex.  Neomycin in triple antibiotic cream.  Preservatives in products, including clothing. SYMPTOMS  The area of skin that is exposed may develop:  Dryness or flaking.  Redness.  Cracks.  Itching.  Pain or a burning sensation.  Blisters. With allergic contact dermatitis, there may also be swelling in areas such as the eyelids, mouth, or genitals.  DIAGNOSIS  Your caregiver can usually tell what the problem is by doing a physical exam. In cases where the cause is uncertain and an allergic contact dermatitis is suspected, a patch skin test may be performed to help determine the cause of your dermatitis. TREATMENT Treatment includes protecting the skin from further contact with the irritating substance by avoiding that substance if possible. Barrier creams, powders, and gloves may be helpful. Your caregiver may also recommend:  Steroid creams or ointments applied 2 times daily. For best results, soak the rash area in cool water for 20 minutes. Then apply the medicine. Cover the area with a plastic wrap. You can store the steroid cream in the refrigerator for a "chilly"  effect on your rash. That may decrease itching. Oral steroid medicines may be needed in more severe cases.  Antibiotics or antibacterial ointments if a skin infection is present.  Antihistamine lotion or an antihistamine taken by mouth to ease itching.  Lubricants to keep moisture in your skin.  Burow's solution to reduce redness and soreness or to dry a weeping rash. Mix one packet or tablet of solution in 2 cups cool water. Dip a clean washcloth in the mixture, wring it out a bit, and put it on the affected area. Leave the cloth in place for 30 minutes. Do this as often as possible throughout the day.  Taking several cornstarch or baking soda baths daily if the area is too large to cover with a washcloth. Harsh chemicals, such as alkalis or acids, can cause skin damage that is like a burn. You should flush your skin for 15 to 20 minutes with cold water after such an exposure. You should also seek immediate medical care after exposure. Bandages (dressings), antibiotics, and pain medicine may be needed for severely irritated skin.  HOME CARE INSTRUCTIONS  Avoid the substance that caused your reaction.  Keep the area of skin that is affected away from hot water, soap, sunlight, chemicals, acidic substances, or anything else that would irritate your skin.  Do not scratch the rash. Scratching may cause the rash to become infected.  You may take cool baths to help stop the itching.  Only take over-the-counter or prescription medicines as directed by your caregiver.  See your caregiver for follow-up care as directed to make sure your skin is healing properly. SEEK MEDICAL CARE IF:   Your condition is not better after 3   days of treatment.  You seem to be getting worse.  You see signs of infection such as swelling, tenderness, redness, soreness, or warmth in the affected area.  You have any problems related to your medicines. Document Released: 12/08/2000 Document Revised: 03/04/2012  Document Reviewed: 05/16/2011 [facility] Patient Information 2015 [place], [name]. This information is not intended to replace advice given to you by your health care provider. Make sure you discuss any questions you have with your health care provider. Shingles Shingles (herpes zoster) is an infection that is caused by the same virus that causes chickenpox (varicella). The infection causes a painful skin rash and fluid-filled blisters, which eventually break open, crust over, and heal. It may occur in any area of the body, but it usually affects only one side of the body or face. The pain of shingles usually lasts about 1 month. However, some people with shingles may develop long-term (chronic) pain in the affected area of the body. Shingles often occurs many years after the person had chickenpox. It is more common:  In people older than 50 years.  In people with weakened immune systems, such as those with HIV, AIDS, or cancer.  In people taking medicines that weaken the immune system, such as transplant medicines.  In people under great stress. CAUSES  Shingles is caused by the varicella zoster virus (VZV), which also causes chickenpox. After a person is infected with the virus, it can remain in the person's body for years in an inactive state (dormant). To cause shingles, the virus reactivates and breaks out as an infection in a nerve root. The virus can be spread from person to person (contagious) through contact with open blisters of the shingles rash. It will only spread to people who have not had chickenpox. When these people are exposed to the virus, they may develop chickenpox. They will not develop shingles. Once the blisters scab over, the person is no longer contagious and cannot spread the virus to others. SIGNS AND SYMPTOMS  Shingles shows up in stages. The initial symptoms may be pain, itching, and tingling in an area of the skin. This pain is usually described as burning, stabbing, or  throbbing.In a few days or weeks, a painful red rash will appear in the area where the pain, itching, and tingling were felt. The rash is usually on one side of the body in a band or belt-like pattern. Then, the rash usually turns into fluid-filled blisters. They will scab over and dry up in approximately 2-3 weeks. Flu-like symptoms may also occur with the initial symptoms, the rash, or the blisters. These may include:  Fever.  Chills.  Headache.  Upset stomach. DIAGNOSIS  Your health care provider will perform a skin exam to diagnose shingles. Skin scrapings or fluid samples may also be taken from the blisters. This sample will be examined under a microscope or sent to a lab for further testing. TREATMENT  There is no specific cure for shingles. Your health care provider will likely prescribe medicines to help you manage the pain, recover faster, and avoid long-term problems. This may include antiviral drugs, anti-inflammatory drugs, and pain medicines. HOME CARE INSTRUCTIONS   Take a cool bath or apply cool compresses to the area of the rash or blisters as directed. This may help with the pain and itching.   Take medicines only as directed by your health care provider.   Rest as directed by your health care provider.  Keep your rash and blisters clean with mild  soap and cool water or as directed by your health care provider.  Do not pick your blisters or scratch your rash. Apply an anti-itch cream or numbing creams to the affected area as directed by your health care provider.  Keep your shingles rash covered with a loose bandage (dressing).  Avoid skin contact with:  Babies.   Pregnant women.   Children with eczema.   Elderly people with transplants.   People with chronic illnesses, such as leukemia or AIDS.   Wear loose-fitting clothing to help ease the pain of material rubbing against the rash.  Keep all follow-up visits as directed by your health care  provider.If the area involved is on your face, you may receive a referral for a specialist, such as an eye doctor (ophthalmologist) or an ear, nose, and throat (ENT) doctor. Keeping all follow-up visits will help you avoid eye problems, chronic pain, or disability.  SEEK IMMEDIATE MEDICAL CARE IF:   You have facial pain, pain around the eye area, or loss of feeling on one side of your face.  You have ear pain or ringing in your ear.  You have loss of taste.  Your pain is not relieved with prescribed medicines.   Your redness or swelling spreads.   You have more pain and swelling.  Your condition is worsening or has changed.   You have a fever. MAKE SURE YOU:  Understand these instructions.  Will watch your condition.  Will get help right away if you are not doing well or get worse. Document Released: 12/11/2005 Document Revised: 04/27/2014 Document Reviewed: 07/25/2012 [facility] Patient Information 2015 [place], [name]. This information is not intended to replace advice given to you by your health care provider. Make sure you discuss any questions you have with your health care provider.

## 2014-08-24 LAB — COMPREHENSIVE METABOLIC PANEL
ALT: <8 U/L (ref 0–35)
AST: 16 U/L (ref 0–37)
Albumin: 4.2 g/dL (ref 3.5–5.2)
Alkaline Phosphatase: 85 U/L (ref 39–117)
BUN: 28 mg/dL — ABNORMAL HIGH (ref 6–23)
CO2: 29 mEq/L (ref 19–32)
Calcium: 10.2 mg/dL (ref 8.4–10.5)
Chloride: 105 mEq/L (ref 96–112)
Creat: 0.85 mg/dL (ref 0.50–1.10)
Glucose, Bld: 88 mg/dL (ref 70–99)
Potassium: 5 mEq/L (ref 3.5–5.3)
Sodium: 141 mEq/L (ref 135–145)
Total Bilirubin: 0.4 mg/dL (ref 0.2–1.2)
Total Protein: 6.6 g/dL (ref 6.0–8.3)

## 2014-08-24 LAB — TSH: TSH: 1.356 u[IU]/mL (ref 0.350–4.500)

## 2014-08-25 ENCOUNTER — Encounter: Payer: Self-pay | Admitting: Radiology

## 2014-08-25 ENCOUNTER — Telehealth: Payer: Self-pay

## 2014-08-25 NOTE — Telephone Encounter (Signed)
Spoke to [facility]- pt rash has changed appearance and temperature. Advised [name] to RTC.

## 2014-08-25 NOTE — Telephone Encounter (Signed)
[name] called back. States patient is feeling miserable and they have yet to receive a call back. Please return call at [phone] if before 5 pm. If after 5, call [phone].

## 2014-08-25 NOTE — Telephone Encounter (Signed)
[name] from [facility] is a caretaker for pt and would like to speak with someone regarding the medication she was given, want to know if she could stop the meds and take tylenol or something else Instead. Please call [phone]

## 2014-08-26 ENCOUNTER — Ambulatory Visit (INDEPENDENT_AMBULATORY_CARE_PROVIDER_SITE_OTHER): Payer: PRIVATE HEALTH INSURANCE | Admitting: Physician Assistant

## 2014-08-26 VITALS — BP 100/60 | HR 74 | Temp 98.7°F | Resp 16 | Ht 59.0 in | Wt 155.0 lb

## 2014-08-26 DIAGNOSIS — F411 Generalized anxiety disorder: Secondary | ICD-10-CM | POA: Insufficient documentation

## 2014-08-26 DIAGNOSIS — R21 Rash and other nonspecific skin eruption: Secondary | ICD-10-CM

## 2014-08-26 DIAGNOSIS — G43909 Migraine, unspecified, not intractable, without status migrainosus: Secondary | ICD-10-CM | POA: Insufficient documentation

## 2014-08-26 DIAGNOSIS — F79 Unspecified intellectual disabilities: Secondary | ICD-10-CM | POA: Insufficient documentation

## 2014-08-26 DIAGNOSIS — M1990 Unspecified osteoarthritis, unspecified site: Secondary | ICD-10-CM | POA: Insufficient documentation

## 2014-08-26 NOTE — Patient Instructions (Signed)
You may stop the Valtrex pills. Continue using the cream.  Drink at least 64 ounces of water daily. Consider a humidifier for the room where you sleep. Bathe once daily. Avoid using HOT water, as it dries skin. Avoid deodorant soaps (Dial is the worst!) and stick with gentle cleansers (I like Cetaphil Liquid Cleanser). After bathing, dry off completely, then apply a thick emollient cream (I like Cetaphil Moisturizing Cream). Apply the cream twice daily, or more!

## 2014-08-26 NOTE — Progress Notes (Signed)
   Subjective:    Patient ID: Heather [name], female    DOB: Jan 30, 1961, 53 y.o.   MRN: [id number]   PCP: [name], MD  Chief Complaint  Patient presents with  . Follow-up    Rash getting worse    Medications, allergies, past medical history, surgical history, family history, social history and problem list reviewed and updated.  HPI  Heather [name] presents today for re-evaluation of a rash on her LEFT leg.  She was evaluated here by Dr. Elder Cyphers on 8/29.  At that visit, the rash was noted to be erythematous maculopapular on the outer aspect of the LEFT thigh and on the outer aspect of the LEFT lower leg.  No specific causative agent was identified [employer], pulling weeds in the yard, new vitamins all considered).  It was thought likely contact dermatitis, but shingles couldn't be excluded. She was prescribed clobetasol cream and Valtrex.  Her Caregiver, [name], called today concerned that the rash had changed in appearance. She reported by phone that the patient was miserable, and was advised to bring Heather [name] back for re-evaluation.  [name] says that the Valtrex is causing Heather [name] to feel very fatigued, too tired to go to work at [employer]. [name] notes that the rash is less red than before, and "seems to have moved," but not gotten larger or spread to any new places.  [name] also points out several similar lesions on herself-ankle and trunk.  Heather [name] reports that it's only minimally itchy.  No fever, chills. No new lesions. No drainage.  No pain.  Review of Systems As above.    Objective:   Physical Exam  Constitutional: She is oriented to person, place, and time. She appears well-developed and well-nourished. She is active and cooperative. No distress.  BP 100/60  Pulse 74  Temp(Src) 98.7 F (37.1 C) (Oral)  Resp 16  Ht 4\' 11"  (1.499 m)  Wt 155 lb (70.308 kg)  BMI 31.29 kg/m2  SpO2 98%   Eyes: Conjunctivae are normal.  Pulmonary/Chest: Effort normal.  Neurological: She is  alert and oriented to person, place, and time.  Skin: Rash (erythematous, macular. Some vasculitic appearance at the ankle. No induration. Non-tender. Photo in Media of THIGH lesion.) noted.     Psychiatric: She has a normal mood and affect. Her speech is normal and behavior is normal.          Assessment & Plan:  1. Rash Contact dermatitis, resolving. OK to D/C Valtrex. Continue clobetasol cream prn. Anticipatory guidance. Skin hygiene reviewed. RTC if worsens/persists.   Fara Chute, PA-C Physician Assistant-Certified [facility]

## 2014-09-17 ENCOUNTER — Ambulatory Visit (INDEPENDENT_AMBULATORY_CARE_PROVIDER_SITE_OTHER): Payer: PRIVATE HEALTH INSURANCE | Admitting: Family Medicine

## 2014-09-17 ENCOUNTER — Encounter: Payer: Self-pay | Admitting: Family Medicine

## 2014-09-17 ENCOUNTER — Telehealth: Payer: Self-pay | Admitting: *Deleted

## 2014-09-17 VITALS — BP 112/74 | HR 72 | Temp 98.9°F | Resp 16 | Ht <= 58 in | Wt 157.8 lb

## 2014-09-17 DIAGNOSIS — R519 Headache, unspecified: Secondary | ICD-10-CM

## 2014-09-17 DIAGNOSIS — M1710 Unilateral primary osteoarthritis, unspecified knee: Secondary | ICD-10-CM | POA: Insufficient documentation

## 2014-09-17 DIAGNOSIS — M8580 Other specified disorders of bone density and structure, unspecified site: Secondary | ICD-10-CM

## 2014-09-17 DIAGNOSIS — R635 Abnormal weight gain: Secondary | ICD-10-CM

## 2014-09-17 DIAGNOSIS — M899 Disorder of bone, unspecified: Secondary | ICD-10-CM

## 2014-09-17 DIAGNOSIS — F32A Depression, unspecified: Secondary | ICD-10-CM

## 2014-09-17 DIAGNOSIS — IMO0002 Reserved for concepts with insufficient information to code with codable children: Secondary | ICD-10-CM

## 2014-09-17 DIAGNOSIS — R5381 Other malaise: Secondary | ICD-10-CM

## 2014-09-17 DIAGNOSIS — E559 Vitamin D deficiency, unspecified: Secondary | ICD-10-CM

## 2014-09-17 DIAGNOSIS — F329 Major depressive disorder, single episode, unspecified: Secondary | ICD-10-CM

## 2014-09-17 DIAGNOSIS — F3289 Other specified depressive episodes: Secondary | ICD-10-CM

## 2014-09-17 DIAGNOSIS — F411 Generalized anxiety disorder: Secondary | ICD-10-CM

## 2014-09-17 DIAGNOSIS — M949 Disorder of cartilage, unspecified: Secondary | ICD-10-CM

## 2014-09-17 DIAGNOSIS — Z23 Encounter for immunization: Secondary | ICD-10-CM

## 2014-09-17 DIAGNOSIS — Z1231 Encounter for screening mammogram for malignant neoplasm of breast: Secondary | ICD-10-CM

## 2014-09-17 DIAGNOSIS — R51 Headache: Secondary | ICD-10-CM

## 2014-09-17 DIAGNOSIS — R5383 Other fatigue: Secondary | ICD-10-CM

## 2014-09-17 MED ORDER — TOPIRAMATE 50 MG PO TABS: 50.0000 mg | ORAL_TABLET | Freq: Two times a day (BID) | ORAL | Status: DC

## 2014-09-17 NOTE — Addendum Note (Signed)
Addended by: Alfredia Ferguson A on: 09/17/2014 02:13 PM   Modules accepted: Orders

## 2014-09-17 NOTE — Telephone Encounter (Signed)
Call [facility] to have a left knee brace fitted for patient, per Dr Joseph Art. Mr Wynetta Emery will call patient to set up date/time.

## 2014-09-17 NOTE — Progress Notes (Signed)
This chart was scribed for Heather Haber, MD by Heather [name], Medical Scribe. This patient was seen in room Rm 24 and the patient's care was started at 11:30 AM.  Patient ID: Heather [name] MRN: [id number], DOB: Oct 25, 1961, 53 y.o. Date of Encounter: 09/17/2014, 8:17 AM  Primary Physician: Heather Haber, MD  No chief complaint on file.   HPI: 53 y.o. year old female accompanied by her friend and caregiver with history below needing a flu shot.  Patient's friend states that the patient is also interested in receiving a "pneumonia shot" and "shingles shot".  Patient's friend states she is interested in scheduling a mammogram through [facility].  Patient reports she is prescribed Paxil and takes it twice/week on Wednesday and Sunday night   Patient's friend states the patient has been becoming fatigued in the afternoon and is concerned about the patient's thyroid.  However, she also attributes this potentially to a history of depression because the patient misses her mother who has passed away.  Patient, however, lives with her friend/caregiver and stays active by performing house chores and keeping a family pet company.  Patient reports she is not having any difficulty sleeping.  Patient has had a normal opthalmology appointment recently.    Patient also has a history of left knee problems and currently seeks a new brace.    Patient's friend states that the patient has a history of frequent headaches, which have been recurring recently.    Patient's daughter also states she is interested in a bone-density test  Patient's friend states that she wants to discuss the patient's diet.  She states that the patient is limited in her ability to exercise due to her knee issues and thus is afraid patient may gain some weight.  She states that they often eat at home and try to eat healthfully including low carb dinners if earlier meals in the day were carb-rich.  Patient  denies needing medication refills today.    Past Medical History  Diagnosis Date   Mental retardation     history restored after error with record merge       Intestinal obstruction     as an infant history restored after error with record merge       Arthritis     history restored after error with record merge       Anxiety     history restored after error with record merge       Fatty infiltration of liver     history restored after error with record merge         Home Meds: Prior to Admission medications   Medication Sig Start Date End Date Taking? Authorizing Provider  cholecalciferol (VITAMIN D) 1000 UNITS tablet Take 1,000 Units by mouth daily.    Historical Provider, MD  clobetasol ointment (TEMOVATE) 9.74 % Apply 1 application topically 2 (two) times daily. Apply bid to itchy rash. Do not use on face or genitals or axillae 08/22/14   Orma Flaming, MD  Magnesium 250 MG TABS Take 250 mg by mouth daily.    Historical Provider, MD  montelukast (SINGULAIR) 10 MG tablet Take 1 tablet (10 mg total) by mouth at bedtime. 07/15/14   Heather Haber, MD  PARoxetine (PAXIL) 30 MG tablet Take twice a week. 04/02/14   Heather Haber, MD    Allergies: No Known Allergies  History   Social History   Marital Status: Single    Spouse Name: n/a  Number of Children: 0   Years of Education: N/A   Occupational History   [employer]    Social History Main Topics   Smoking status: Never Smoker    Smokeless tobacco: Never Used   Alcohol Use: No   Drug Use: No   Sexual Activity: No   Other Topics Concern   Not on file   Social History Narrative   Lives with Heather [name], a close friend of her mother, and [name]'s adult son.         Review of Systems: Constitutional: negative for chills, fever, night sweats, weight changes, or + fatigue  HEENT: negative for vision changes, hearing loss, congestion, rhinorrhea, ST, epistaxis, or sinus pressure Cardiovascular:  negative for chest pain or palpitations Respiratory: negative for hemoptysis, wheezing, shortness of breath, or cough Abdominal: negative for abdominal pain, nausea, vomiting, diarrhea, or constipation Dermatological: negative for rash Neurologic: negative for + headache, dizziness, or syncope Musculoskeletal: + arthralgias All other systems reviewed and are otherwise negative with the exception to those above and in the HPI.   Physical Exam: There were no vitals taken for this visit., There is no weight on file to calculate BMI. General: Well developed, well nourished, in no acute distress. Head: Normocephalic, atraumatic, eyes without discharge, sclera non-icteric, nares are without discharge. Bilateral auditory canals clear, TM's are without perforation, pearly grey and translucent with reflective cone of light bilaterally. Oral cavity moist, posterior pharynx without exudate, erythema, peritonsillar abscess, or post nasal drip.  Neck: Supple. No thyromegaly. Full ROM. No lymphadenopathy. Lungs: Clear bilaterally to auscultation without wheezes, rales, or rhonchi. Breathing is unlabored. Heart: RRR with S1 S2. No murmurs, rubs, or gallops appreciated. Abdomen: Soft, non-tender, non-distended with normoactive bowel sounds. No hepatomegaly. No rebound/guarding. No obvious abdominal masses. Msk:  Strength and tone normal for age. Extremities/Skin: Warm and dry. No clubbing or cyanosis. No edema. No rashes or suspicious lesions. Neuro: Alert and oriented X 3. Moves all extremities spontaneously. Gait is normal. CNII-XII grossly in tact. Psych:  Responds to questions appropriately with a normal affect.   Labs:  Results for orders placed in visit on 08/22/14  COMPREHENSIVE METABOLIC PANEL      Result Value Ref Range   Sodium 141  135 - 145 mEq/L   Potassium 5.0  3.5 - 5.3 mEq/L   Chloride 105  96 - 112 mEq/L   CO2 29  19 - 32 mEq/L   Glucose, Bld 88  70 - 99 mg/dL   BUN 28 (*) 6 - 23  mg/dL   Creat 0.85  0.50 - 1.10 mg/dL   Total Bilirubin 0.4  0.2 - 1.2 mg/dL   Alkaline Phosphatase 85  39 - 117 U/L   AST 16  0 - 37 U/L   ALT <8  0 - 35 U/L   Total Protein 6.6  6.0 - 8.3 g/dL   Albumin 4.2  3.5 - 5.2 g/dL   Calcium 10.2  8.4 - 10.5 mg/dL  TSH      Result Value Ref Range   TSH 1.356  0.350 - 4.500 uIU/mL  POCT CBC      Result Value Ref Range   WBC 6.0  4.6 - 10.2 K/uL   Lymph, poc 1.6  0.6 - 3.4   POC LYMPH PERCENT 27.2  10 - 50 %L   MID (cbc) 0.3  0 - 0.9   POC MID % 4.3  0 - 12 %M   POC Granulocyte 4.1  2 -  6.9   Granulocyte percent 68.5  37 - 80 %G   RBC 4.21  4.04 - 5.48 M/uL   Hemoglobin 13.0  12.2 - 16.2 g/dL   HCT, POC 40.6  37.7 - 47.9 %   MCV 96.5  80 - 97 fL   MCH, POC 30.9  27 - 31.2 pg   MCHC 32.0  31.8 - 35.4 g/dL   RDW, POC 13.6     Platelet Count, POC 163  142 - 424 K/uL   MPV 9.5  0 - 99.8 fL  POCT SEDIMENTATION RATE      Result Value Ref Range   POCT SED RATE 22  0 - 22 mm/hr     ASSESSMENT AND PLAN:  53 y.o. year old female with  Need for prophylactic vaccination and inoculation against influenza - Plan: Flu Vaccine QUAD 36+ mos IM  Unspecified vitamin D deficiency - Plan: Vit D  25 hydroxy (rtn osteoporosis monitoring)  Bilateral headaches - Plan: topiramate (TOPAMAX) 50 MG tablet  Arthritis of knee  Weight gain - Plan: T4, free, TSH  Need for prophylactic vaccination against Streptococcus pneumoniae (pneumococcus) - Plan: Pneumococcal conjugate vaccine 13-valent IM  Other fatigue  Depression  Anxiety state, unspecified    11:53 AM Advised patient that shingles shot is not covered by insurance and will cost $200.  Will provide referral to [facility] for left knee brace fitting.  Advised patient that she will be prescribed Topamax to address her headaches.  Will provide referral if need for mammogram, but it is unlikely that this is needed.  Informed patient of need to order bone-density test.  Signed, Heather Haber, MD 09/17/2014 8:17 AM

## 2014-09-18 LAB — TSH: TSH: 1.81 u[IU]/mL (ref 0.350–4.500)

## 2014-09-18 LAB — T4, FREE: Free T4: 1.12 ng/dL (ref 0.80–1.80)

## 2014-09-18 LAB — VITAMIN D 25 HYDROXY (VIT D DEFICIENCY, FRACTURES): Vit D, 25-Hydroxy: 46 ng/mL (ref 30–89)

## 2014-09-28 ENCOUNTER — Telehealth: Payer: Self-pay

## 2014-09-28 NOTE — Telephone Encounter (Signed)
Spoke to [place] [place] and explained the lab results. Pt was not taking synthroid caretaker was confused about the results.

## 2014-09-28 NOTE — Telephone Encounter (Signed)
Pt's caretaker called to inquire about the latest lab results. She would like further explanation as to why the vitamin d supplement is no longer necessary. If callilng before 3:303: [phone] After 3:30- [phone]

## 2014-10-04 ENCOUNTER — Ambulatory Visit (INDEPENDENT_AMBULATORY_CARE_PROVIDER_SITE_OTHER): Payer: PRIVATE HEALTH INSURANCE | Admitting: Internal Medicine

## 2014-10-04 VITALS — BP 114/76 | HR 76 | Temp 98.8°F | Resp 18 | Ht <= 58 in | Wt 157.0 lb

## 2014-10-04 DIAGNOSIS — R05 Cough: Secondary | ICD-10-CM

## 2014-10-04 DIAGNOSIS — J309 Allergic rhinitis, unspecified: Secondary | ICD-10-CM

## 2014-10-04 DIAGNOSIS — R059 Cough, unspecified: Secondary | ICD-10-CM

## 2014-10-04 MED ORDER — HYDROCODONE-HOMATROPINE 5-1.5 MG/5ML PO SYRP: 5.0000 mL | ORAL_SOLUTION | Freq: Every day | ORAL | Status: DC

## 2014-10-04 MED ORDER — BENZONATATE 100 MG PO CAPS: 100.0000 mg | ORAL_CAPSULE | Freq: Two times a day (BID) | ORAL | Status: DC | PRN

## 2014-10-04 MED ORDER — FLUTICASONE PROPIONATE 50 MCG/ACT NA SUSP: NASAL | Status: DC

## 2014-10-04 MED ORDER — CETIRIZINE HCL 10 MG PO TABS: 10.0000 mg | ORAL_TABLET | Freq: Every day | ORAL | Status: DC

## 2014-10-04 NOTE — Progress Notes (Signed)
   Subjective:    Patient ID: Heather [name], female    DOB: 09-10-1961, 53 y.o.   MRN: [id number]  HPI four-day history of cough/ it started after using bleach to clean the bathroom at work Nonproductive/no fever/no change in activity or appetite/no sore throat or ear congestion History of frequent allergic rhinitis  Review of Systems Noncontributory    Objective:   Physical Exam BP 114/76  Pulse 76  Temp(Src) 98.8 F (37.1 C) (Oral)  Resp 18  Ht 4\' 10"  (1.473 m)  Wt 157 lb (71.215 kg)  BMI 32.82 kg/m2  SpO2 99% HEENT clear except clear rhinorrhea No nodes Chest clear to auscultation       Assessment & Plan:  Allergic rhinitis, unspecified allergic rhinitis type  Cough  Meds ordered this encounter  Medications  . cetirizine (ZYRTEC) 10 MG tablet    Sig: Take 1 tablet (10 mg total) by mouth daily.    Dispense:  15 tablet    Refill:  0  . fluticasone (FLONASE) 50 MCG/ACT nasal spray    Sig: 1 spray each nostril twice a day    Dispense:  16 g    Refill:  6  . benzonatate (TESSALON) 100 MG capsule    Sig: Take 1 capsule (100 mg total) by mouth 2 (two) times daily as needed for cough.    Dispense:  20 capsule    Refill:  0  . HYDROcodone-homatropine (HYCODAN) 5-1.5 MG/5ML syrup    Sig: Take 5 mLs by mouth at bedtime.    Dispense:  30 mL    Refill:  0   Followup if worse

## 2014-10-14 ENCOUNTER — Other Ambulatory Visit: Payer: Self-pay | Admitting: Internal Medicine

## 2014-10-15 NOTE — Telephone Encounter (Signed)
Dr Laney Pastor, do you want to RF or have pt RTC for re-eval?

## 2014-10-25 ENCOUNTER — Other Ambulatory Visit: Payer: Self-pay | Admitting: Internal Medicine

## 2014-10-26 NOTE — Telephone Encounter (Signed)
Dr Laney Pastor, you only gave pt #15 tabs at [place], but notes say AR is freq problem. Do you want to RF or RTC?

## 2014-11-01 ENCOUNTER — Ambulatory Visit (INDEPENDENT_AMBULATORY_CARE_PROVIDER_SITE_OTHER): Payer: PRIVATE HEALTH INSURANCE

## 2014-11-01 ENCOUNTER — Ambulatory Visit (INDEPENDENT_AMBULATORY_CARE_PROVIDER_SITE_OTHER): Payer: PRIVATE HEALTH INSURANCE | Admitting: Family Medicine

## 2014-11-01 VITALS — BP 114/66 | HR 96 | Temp 98.3°F | Resp 18 | Ht <= 58 in | Wt 153.0 lb

## 2014-11-01 DIAGNOSIS — R05 Cough: Secondary | ICD-10-CM

## 2014-11-01 DIAGNOSIS — R059 Cough, unspecified: Secondary | ICD-10-CM

## 2014-11-01 DIAGNOSIS — K219 Gastro-esophageal reflux disease without esophagitis: Secondary | ICD-10-CM

## 2014-11-01 DIAGNOSIS — J309 Allergic rhinitis, unspecified: Secondary | ICD-10-CM

## 2014-11-01 MED ORDER — MOMETASONE FUROATE 50 MCG/ACT NA SUSP: 2.0000 | Freq: Every day | NASAL | Status: DC

## 2014-11-01 MED ORDER — AZITHROMYCIN 250 MG PO TABS: ORAL_TABLET | ORAL | Status: DC

## 2014-11-01 NOTE — Progress Notes (Signed)
Subjective:    Patient ID: Heather [name], female    DOB: February 23, 1961, 53 y.o.   MRN: [id number]  HPI Chief Complaint  Patient presents with  . Cough    x 1 month   This chart was scribed for Torri Michalski, MD by Thea Alken, ED Scribe. This patient was seen in room 14 and the patient's care was started at 9:44 AM.  HPI Comments: Heather ([place]) is a 53 y.o. female who presents to the [facility] complaining of a cough. She was seen October 11, by Dr. Laney Pastor for 4 days of cough after using bleach to clean the bathroom at work. She has hx of allergies and was started on flonase, zyrtec, hycodan and tessalon.Today, pt presents with an unchanged, non productive, dry cough that began 1 month ago. Pt has tried [employer], and 2 allergies pills one at night and one during the day. She tried flonase a few times but reports medication gave her a HA. She reports wheezy SOB, congestion, HA, heart burn with eating. Occasionally takes a tic tac or peppermint OTC but not everyday for heart burn symptoms.  She denies fever but has had hot flashes. She denies postnasal drip  Pt recently had a mammogram and bone density.   Patient Active Problem List   Diagnosis Date Noted  . Arthritis of knee 09/17/2014  . Arthritis 08/26/2014  . Anxiety state, unspecified 08/26/2014  . Migraine 08/26/2014  . Mental retardation 08/26/2014   Past Medical History  Diagnosis Date  . Mental retardation     history restored after error with record merge      . Intestinal obstruction     as an infant history restored after error with record merge      . Arthritis     history restored after error with record merge      . Anxiety     history restored after error with record merge      . Fatty infiltration of liver     history restored after error with record merge       Past Surgical History  Procedure Laterality Date  . Knee dislocation surgery    . Abdominal hysterectomy    .  Colonoscopy    . Polypectomy    . Sigmoidoscopy    . Fused knee     No Known Allergies Prior to Admission medications   Medication Sig Start Date End Date Taking? Authorizing Provider  benzonatate (TESSALON) 100 MG capsule TAKE ONE CAPSULE BY MOUTH TWICE A DAY AS NEEDED FOR COUGH 10/16/14  Yes Leandrew Koyanagi, MD  cetirizine (ZYRTEC) 10 MG tablet TAKE 1 TABLET BY MOUTH EVERY DAY 10/26/14  Yes Leandrew Koyanagi, MD  cholecalciferol (VITAMIN D) 1000 UNITS tablet Take 1,000 Units by mouth daily.   Yes Historical Provider, MD  clobetasol ointment (TEMOVATE) 9.48 % Apply 1 application topically 2 (two) times daily. Apply bid to itchy rash. Do not use on face or genitals or axillae 08/22/14  Yes Orma Flaming, MD  fluticasone [facility]) 50 MCG/ACT nasal spray 1 spray each nostril twice a day 10/04/14  Yes Leandrew Koyanagi, MD  HYDROcodone-homatropine [facility]) 5-1.5 MG/5ML syrup Take 5 mLs by mouth at bedtime. 10/04/14  Yes Leandrew Koyanagi, MD  Magnesium 250 MG TABS Take 250 mg by mouth daily.   Yes Historical Provider, MD  montelukast (SINGULAIR) 10 MG tablet Take 1 tablet (10 mg total) by mouth at bedtime. 07/15/14  Yes [name], MD  PARoxetine (PAXIL) 30 MG tablet Take twice a week. 04/02/14  Yes [name], MD  topiramate (TOPAMAX) 50 MG tablet Take 1 tablet (50 mg total) by mouth 2 (two) times daily. 09/17/14  Yes [name], MD   History   Social History  . Marital Status: Single    Spouse Name: n/a    Number of Children: 0  . Years of Education: N/A   Occupational History  . [employer]    Social History Main Topics  . Smoking status: Never Smoker   . Smokeless tobacco: Never Used  . Alcohol Use: No  . Drug Use: No  . Sexual Activity: No   Other Topics Concern  . Not on file   Social History Narrative   Lives with [name], a close friend of her mother, and [name]'s adult son.       Review of Systems  Constitutional: Negative for fever.  HENT:  Positive for congestion. Negative for postnasal drip.   Respiratory: Positive for cough, shortness of breath and wheezing.   Neurological: Positive for headaches.   Objective:   Physical Exam  Constitutional: She is oriented to person, place, and time. She appears well-developed and well-nourished. No distress.  HENT:  Head: Normocephalic and atraumatic.  Right Ear: Hearing, tympanic membrane, external ear and ear canal normal.  Left Ear: Hearing, tympanic membrane, external ear and ear canal normal.  Nose: Right sinus exhibits frontal sinus tenderness. Right sinus exhibits no maxillary sinus tenderness. Left sinus exhibits frontal sinus tenderness. Left sinus exhibits no maxillary sinus tenderness.  Mouth/Throat: Oropharynx is clear and moist. No oropharyngeal exudate.  Minimal edema of turbinates.   Eyes: Conjunctivae and EOM are normal. Pupils are equal, round, and reactive to light.  Cardiovascular: Normal rate, regular rhythm, normal heart sounds and intact distal pulses.   No murmur heard. Pulmonary/Chest: Effort normal and breath sounds normal. No respiratory distress. She has no wheezes. She has no rhonchi.  Musculoskeletal: She exhibits no edema.  Neurological: She is alert and oriented to person, place, and time.  Skin: Skin is warm and dry. No rash noted.  Psychiatric: She has a normal mood and affect. Her behavior is normal.  Vitals reviewed.  Filed Vitals:   11/01/14 0929  BP: 114/66  Pulse: 96  Temp: 98.3 F (36.8 C)  Resp: 18  Height: 4\' 10"  (1.473 m)  Weight: 153 lb (69.4 kg)  SpO2: 99%   [facility] reading (PRIMARY) by Dr. Carlota Raspberry CXR marked scoliosis right sided. No apparent infiltrate   Assessment & Plan:   Heather [name] is a 53 y.o. female Cough - Plan: DG Chest 2 View, azithromycin (ZITHROMAX) 250 MG tablet  Allergic rhinitis, unspecified allergic rhinitis type - Plan: DG Chest 2 View, mometasone (NASONEX) 50 MCG/ACT nasal spray  Gastroesophageal reflux  disease, esophagitis presence not specified Possible multifactorial cough, but persistent.    -zpak for sinobronchitis, saline ns.   -nexium samples - QD, then prilosec otc.QD for LPR/heartburn cause  -cont zyrtec, change to nasonex for allergic rhinitis - correct technique discussed.   - other sx care , including tessalon if needed.   - rtc precautions given if not improving. Sooner if worse.   Meds ordered this encounter  Medications  . azithromycin (ZITHROMAX) 250 MG tablet    Sig: Take 2 pills by mouth on day 1, then 1 pill by mouth per day on days 2 through 5.    Dispense:  6 tablet  Refill:  0  . mometasone (NASONEX) 50 MCG/ACT nasal spray    Sig: Place 2 sprays into the nose daily.    Dispense:  17 g    Refill:  2   Patient Instructions  Cough can be from a number of causes. Sinus drainage from allergies or infection, irritation to throat from heartburn, allergies or infection.  Start Nexium once per day for possible heartburn cause - when sample run out - can take over the counter prilosec (store brand equivalent ok) once per day. Avoid peppermints.  Zyrtec once per day for allergies. Saline nasal spray 4-5 times per day. Can check to see if nasonex nasal spray is covered by insurance - "nose to toes, near to ear" technique as discussed.  Tessalon 3 times per day to help suppress cough.  If not improving in next week to 10 days - return to discuss further.   Cough, Adult  A cough is a reflex that helps clear your throat and airways. It can help heal the body or may be a reaction to an irritated airway. A cough may only last 2 or 3 weeks (acute) or may last more than 8 weeks (chronic).  CAUSES Acute cough:  Viral or bacterial infections. Chronic cough:  Infections.  Allergies.  Asthma.  Post-nasal drip.  Smoking.  Heartburn or acid reflux.  Some medicines.  Chronic lung problems (COPD).  Cancer. SYMPTOMS   Cough.  Fever.  Chest pain.  Increased  breathing rate.  High-pitched whistling sound when breathing (wheezing).  Colored mucus that you cough up (sputum). TREATMENT   A bacterial cough may be treated with antibiotic medicine.  A viral cough must run its course and will not respond to antibiotics.  Your caregiver may recommend other treatments if you have a chronic cough. HOME CARE INSTRUCTIONS   Only take over-the-counter or prescription medicines for pain, discomfort, or fever as directed by your caregiver. Use cough suppressants only as directed by your caregiver.  Use a cold steam vaporizer or humidifier in your bedroom or home to help loosen secretions.  Sleep in a semi-upright position if your cough is worse at night.  Rest as needed.  Stop smoking if you smoke. SEEK IMMEDIATE MEDICAL CARE IF:   You have pus in your sputum.  Your cough starts to worsen.  You cannot control your cough with suppressants and are losing sleep.  You begin coughing up blood.  You have difficulty breathing.  You develop pain which is getting worse or is uncontrolled with medicine.  You have a fever. MAKE SURE YOU:   Understand these instructions.  Will watch your condition.  Will get help right away if you are not doing well or get worse. Document Released: 06/09/2011 Document Revised: 03/04/2012 Document Reviewed: 06/09/2011 [facility] Patient Information 2015 [place], [name]. This information is not intended to replace advice given to you by your health care provider. Make sure you discuss any questions you have with your health care provider.     I personally performed the services described in this documentation, which was scribed in my presence. The recorded information has been reviewed and considered, and addended by me as needed.

## 2014-11-01 NOTE — Patient Instructions (Addendum)
Cough can be from a number of causes. Sinus drainage from allergies or infection, irritation to throat from heartburn, allergies or infection.  Start Nexium once per day for possible heartburn cause - when sample run out - can take over the counter prilosec (store brand equivalent ok) once per day. Avoid peppermints.  Zyrtec once per day for allergies. Saline nasal spray 4-5 times per day. Can check to see if nasonex nasal spray is covered by insurance - "nose to toes, near to ear" technique as discussed.  Tessalon 3 times per day to help suppress cough.  If not improving in next week to 10 days - return to discuss further.   Cough, Adult  A cough is a reflex that helps clear your throat and airways. It can help heal the body or may be a reaction to an irritated airway. A cough may only last 2 or 3 weeks (acute) or may last more than 8 weeks (chronic).  CAUSES Acute cough:  Viral or bacterial infections. Chronic cough:  Infections.  Allergies.  Asthma.  Post-nasal drip.  Smoking.  Heartburn or acid reflux.  Some medicines.  Chronic lung problems (COPD).  Cancer. SYMPTOMS   Cough.  Fever.  Chest pain.  Increased breathing rate.  High-pitched whistling sound when breathing (wheezing).  Colored mucus that you cough up (sputum). TREATMENT   A bacterial cough may be treated with antibiotic medicine.  A viral cough must run its course and will not respond to antibiotics.  Your caregiver may recommend other treatments if you have a chronic cough. HOME CARE INSTRUCTIONS   Only take over-the-counter or prescription medicines for pain, discomfort, or fever as directed by your caregiver. Use cough suppressants only as directed by your caregiver.  Use a cold steam vaporizer or humidifier in your bedroom or home to help loosen secretions.  Sleep in a semi-upright position if your cough is worse at night.  Rest as needed.  Stop smoking if you smoke. SEEK IMMEDIATE  MEDICAL CARE IF:   You have pus in your sputum.  Your cough starts to worsen.  You cannot control your cough with suppressants and are losing sleep.  You begin coughing up blood.  You have difficulty breathing.  You develop pain which is getting worse or is uncontrolled with medicine.  You have a fever. MAKE SURE YOU:   Understand these instructions.  Will watch your condition.  Will get help right away if you are not doing well or get worse. Document Released: 06/09/2011 Document Revised: 03/04/2012 Document Reviewed: 06/09/2011 [facility] Patient Information 2015 [place], [name]. This information is not intended to replace advice given to you by your health care provider. Make sure you discuss any questions you have with your health care provider.

## 2014-11-06 ENCOUNTER — Encounter: Payer: Self-pay | Admitting: Family Medicine

## 2014-11-09 ENCOUNTER — Ambulatory Visit: Payer: PRIVATE HEALTH INSURANCE | Admitting: Family Medicine

## 2014-11-11 ENCOUNTER — Encounter: Payer: Self-pay | Admitting: Family Medicine

## 2014-11-12 ENCOUNTER — Other Ambulatory Visit: Payer: Self-pay | Admitting: Family Medicine

## 2014-11-24 ENCOUNTER — Encounter: Payer: Self-pay | Admitting: Family Medicine

## 2014-12-20 ENCOUNTER — Encounter: Payer: Self-pay | Admitting: Family Medicine

## 2014-12-20 ENCOUNTER — Ambulatory Visit (INDEPENDENT_AMBULATORY_CARE_PROVIDER_SITE_OTHER): Payer: PRIVATE HEALTH INSURANCE | Admitting: Family Medicine

## 2014-12-20 VITALS — BP 130/70 | HR 77 | Resp 18

## 2014-12-20 DIAGNOSIS — F410 Panic disorder [episodic paroxysmal anxiety] without agoraphobia: Secondary | ICD-10-CM

## 2014-12-20 DIAGNOSIS — F411 Generalized anxiety disorder: Secondary | ICD-10-CM

## 2014-12-20 LAB — POCT URINALYSIS DIPSTICK
Bilirubin, UA: NEGATIVE
Blood, UA: NEGATIVE
Glucose, UA: NEGATIVE
Ketones, UA: NEGATIVE
Leukocytes, UA: NEGATIVE
Nitrite, UA: NEGATIVE
Protein, UA: NEGATIVE
Spec Grav, UA: 1.015
Urobilinogen, UA: 1
pH, UA: 6

## 2014-12-20 LAB — POCT CBC
Granulocyte percent: 78.4 %G (ref 37–80)
HCT, POC: 40.8 % (ref 37.7–47.9)
Hemoglobin: 13 g/dL (ref 12.2–16.2)
Lymph, poc: 1.2 (ref 0.6–3.4)
MCH, POC: 31 pg (ref 27–31.2)
MCHC: 32 g/dL (ref 31.8–35.4)
MCV: 96.9 fL (ref 80–97)
MID (cbc): 0.3 (ref 0–0.9)
MPV: 9.5 fL (ref 0–99.8)
POC Granulocyte: 5.7 (ref 2–6.9)
POC LYMPH PERCENT: 17.1 %L (ref 10–50)
POC MID %: 4.5 %M (ref 0–12)
Platelet Count, POC: 178 10*3/uL (ref 142–424)
RBC: 4.21 M/uL (ref 4.04–5.48)
RDW, POC: 13.9 %
WBC: 7.3 10*3/uL (ref 4.6–10.2)

## 2014-12-20 LAB — POCT UA - MICROSCOPIC ONLY
Bacteria, U Microscopic: NEGATIVE
Casts, Ur, LPF, POC: NEGATIVE
Crystals, Ur, HPF, POC: NEGATIVE
Epithelial cells, urine per micros: NEGATIVE
Mucus, UA: NEGATIVE
RBC, urine, microscopic: NEGATIVE
WBC, Ur, HPF, POC: NEGATIVE
Yeast, UA: NEGATIVE

## 2014-12-20 MED ORDER — PROMETHAZINE HCL 25 MG/ML IJ SOLN
12.5000 mg | Freq: Once | INTRAMUSCULAR | Status: AC
  Administered 2014-12-20: 12.5 mg via INTRAMUSCULAR

## 2014-12-20 MED ORDER — ALPRAZOLAM 0.25 MG PO TABS: 0.2500 mg | ORAL_TABLET | Freq: Two times a day (BID) | ORAL | Status: DC | PRN

## 2014-12-20 NOTE — Progress Notes (Addendum)
Subjective:    Patient ID: Heather [name], female    DOB: 08/29/1961, 53 y.o.   MRN: [id number] This chart was scribed for Heather Haber, MD by Zola Button, Medical Scribe. This patient was seen in Room 2 and the patient's care was started at 1:31 PM.   HPI HPI Comments: Heather [name] is a 53 y.o. female with a hx of anxiety who presents to the [facility] complaining of anxiety that has worsened the past few days. Patient had a panic attack while in the waiting room, having fainted and felt lightheadedness, and she was brought back here urgently. She also reports chest pain, more frequent HA, nausea since yesterday, abdominal pain and diarrhea. Patient was noticed to be trembling 5 days ago; also, she did not sleep well last night. She has been noted to have a meltdown a few days ago, missing her mother who passed away 1 year ago. Her anxiety has been noted to be getting worse; for example, patient sometimes becomes paranoid about people coming after her when a car is briefly stopped in front of her house. Patient denies change in appetite, leg pain, fever, and urinary symptoms.   Review of Systems  Constitutional: Negative for fever and appetite change.  Cardiovascular: Positive for chest pain.  Gastrointestinal: Positive for nausea, abdominal pain and diarrhea.  Genitourinary: Negative for dysuria, urgency, frequency, hematuria and difficulty urinating.  Musculoskeletal: Negative for myalgias.  Neurological: Positive for tremors, syncope, light-headedness and headaches.  Psychiatric/Behavioral: Positive for sleep disturbance. The patient is nervous/anxious.        Objective:   Physical Exam CONSTITUTIONAL: Well developed/well nourished, tearful and anxious HEAD: Normocephalic/atraumatic EYES: EOM/PERRL ENMT: Mucous membranes moist NECK: supple no meningeal signs SPINE: entire spine nontender CV: S1/S2 noted, no murmurs/rubs/gallops noted LUNGS: Lungs are  clear to auscultation bilaterally, patient is holding her breastbone with a right hand but does not have any localized tenderness ABDOMEN: soft, nontender, no rebound or guarding GU: no cva tenderness NEURO: Pt is awake/alert, moves all extremitiesx4 EXTREMITIES: pulses normal, right leg in splint, nontender calves and no edema SKIN: warm, color normal PSYCH: no abnormalities of mood noted Results for orders placed or performed in visit on 12/20/14  POCT CBC  Result Value Ref Range   WBC 7.3 4.6 - 10.2 K/uL   Lymph, poc 1.2 0.6 - 3.4   POC LYMPH PERCENT 17.1 10 - 50 %L   MID (cbc) 0.3 0 - 0.9   POC MID % 4.5 0 - 12 %M   POC Granulocyte 5.7 2 - 6.9   Granulocyte percent 78.4 37 - 80 %G   RBC 4.21 4.04 - 5.48 M/uL   Hemoglobin 13.0 12.2 - 16.2 g/dL   HCT, POC 40.8 37.7 - 47.9 %   MCV 96.9 80 - 97 fL   MCH, POC 31.0 27 - 31.2 pg   MCHC 32.0 31.8 - 35.4 g/dL   RDW, POC 13.9 %   Platelet Count, POC 178 142 - 424 K/uL   MPV 9.5 0 - 99.8 fL  POCT UA - Microscopic Only  Result Value Ref Range   WBC, Ur, HPF, POC neg    RBC, urine, microscopic neg    Bacteria, U Microscopic neg    Mucus, UA neg    Epithelial cells, urine per micros neg    Crystals, Ur, HPF, POC neg    Casts, Ur, LPF, POC neg    Yeast, UA neg   POCT urinalysis  dipstick  Result Value Ref Range   Color, UA yellow    Clarity, UA clear    Glucose, UA neg    Bilirubin, UA neg    Ketones, UA neg    Spec Grav, UA 1.015    Blood, UA neg    pH, UA 6.0    Protein, UA neg    Urobilinogen, UA 1.0    Nitrite, UA neg    Leukocytes, UA Negative    Results for orders placed or performed in visit on 12/20/14  POCT CBC  Result Value Ref Range   WBC 7.3 4.6 - 10.2 K/uL   Lymph, poc 1.2 0.6 - 3.4   POC LYMPH PERCENT 17.1 10 - 50 %L   MID (cbc) 0.3 0 - 0.9   POC MID % 4.5 0 - 12 %M   POC Granulocyte 5.7 2 - 6.9   Granulocyte percent 78.4 37 - 80 %G   RBC 4.21 4.04 - 5.48 M/uL   Hemoglobin 13.0 12.2 - 16.2 g/dL   HCT,  POC 40.8 37.7 - 47.9 %   MCV 96.9 80 - 97 fL   MCH, POC 31.0 27 - 31.2 pg   MCHC 32.0 31.8 - 35.4 g/dL   RDW, POC 13.9 %   Platelet Count, POC 178 142 - 424 K/uL   MPV 9.5 0 - 99.8 fL  POCT UA - Microscopic Only  Result Value Ref Range   WBC, Ur, HPF, POC neg    RBC, urine, microscopic neg    Bacteria, U Microscopic neg    Mucus, UA neg    Epithelial cells, urine per micros neg    Crystals, Ur, HPF, POC neg    Casts, Ur, LPF, POC neg    Yeast, UA neg   POCT urinalysis dipstick  Result Value Ref Range   Color, UA yellow    Clarity, UA clear    Glucose, UA neg    Bilirubin, UA neg    Ketones, UA neg    Spec Grav, UA 1.015    Blood, UA neg    pH, UA 6.0    Protein, UA neg    Urobilinogen, UA 1.0    Nitrite, UA neg    Leukocytes, UA Negative        Assessment & Plan:   Patient is very anxious and sad this time year because it's the first Christmas she spent without her mother. Regarding the need to increase the Paxil to every other day and I want to see her back in a few days to see if she's doing.  I told him to take the Paxil every other day now and follow-up on Thursday or Friday.  Signed, Carola Frost.D.

## 2014-12-21 ENCOUNTER — Telehealth: Payer: Self-pay

## 2014-12-21 NOTE — Telephone Encounter (Signed)
[name] the care taker, is calling for [employer]. [name] would like to give her a Xanax before bedtime. [name] thinks the Xanax  [place] took last night made her try to climb out of a window and this morning [place] doesn't remember doing this. Please advise [name] at [phone]

## 2014-12-22 NOTE — Telephone Encounter (Signed)
Lm for rtn call- pt is to follow up with Dr. Joseph Art on Thursday or Friday- this is similar to complaint when pt was in office.  Please advise.

## 2014-12-24 ENCOUNTER — Ambulatory Visit: Payer: PRIVATE HEALTH INSURANCE | Admitting: Family Medicine

## 2014-12-24 NOTE — Telephone Encounter (Signed)
Spoke to Heather [name] and she states that pt is doing better.

## 2014-12-24 NOTE — Telephone Encounter (Signed)
If the caregiver is concerned that the Xanax made her try and climb out of the window she should not give it to her until she sees Dr Joseph Art.

## 2014-12-28 ENCOUNTER — Telehealth: Payer: Self-pay

## 2014-12-28 DIAGNOSIS — F419 Anxiety disorder, unspecified: Secondary | ICD-10-CM

## 2014-12-28 MED ORDER — PAROXETINE HCL 30 MG PO TABS: ORAL_TABLET | ORAL | Status: DC

## 2014-12-28 NOTE — Telephone Encounter (Signed)
Sent in new prescription with correct instructions per last OV Tried to reach daughter- wrong number

## 2014-12-28 NOTE — Telephone Encounter (Signed)
Pts daughter states that her mom has now been given different directions for taking her paxil ,she would like a new rx stating pat  Taking the meds every other day instead of twice weekly,per the providers instruction   Best phone for daughter [name] is [address]

## 2014-12-31 ENCOUNTER — Ambulatory Visit: Payer: PRIVATE HEALTH INSURANCE | Admitting: Family Medicine

## 2015-01-07 ENCOUNTER — Encounter: Payer: Self-pay | Admitting: Family Medicine

## 2015-01-07 ENCOUNTER — Ambulatory Visit (INDEPENDENT_AMBULATORY_CARE_PROVIDER_SITE_OTHER): Payer: Medicare Other | Admitting: Family Medicine

## 2015-01-07 VITALS — BP 120/84 | HR 109 | Temp 98.8°F | Resp 16 | Ht <= 58 in | Wt 149.4 lb

## 2015-01-07 DIAGNOSIS — F411 Generalized anxiety disorder: Secondary | ICD-10-CM | POA: Diagnosis not present

## 2015-01-07 DIAGNOSIS — R51 Headache: Secondary | ICD-10-CM | POA: Diagnosis not present

## 2015-01-07 DIAGNOSIS — M8580 Other specified disorders of bone density and structure, unspecified site: Secondary | ICD-10-CM

## 2015-01-07 DIAGNOSIS — R519 Headache, unspecified: Secondary | ICD-10-CM

## 2015-01-07 DIAGNOSIS — L659 Nonscarring hair loss, unspecified: Secondary | ICD-10-CM | POA: Diagnosis not present

## 2015-01-07 DIAGNOSIS — F410 Panic disorder [episodic paroxysmal anxiety] without agoraphobia: Secondary | ICD-10-CM | POA: Diagnosis not present

## 2015-01-07 MED ORDER — ALPRAZOLAM 0.25 MG PO TABS: 0.2500 mg | ORAL_TABLET | Freq: Two times a day (BID) | ORAL | Status: DC | PRN

## 2015-01-07 NOTE — Progress Notes (Signed)
Subjective:  This chart was scribed for [name], MD by Donato Schultz, Medical Scribe. This patient was seen in Room 25 and the patient's care was started at 1:21 PM.   Patient ID: Heather [name], female    DOB: [birth year], 54 y.o.   MRN: [id number]  HPI HPI Comments: Heather [name] is a 54 y.o. female who presents to the Emergency Department for a follow-up concerning her depression.  Her daughter states that she has recently tried increasing her Paxil dosage.  She had a "good cry" yesterday and stated that she felt better.  She started painting yesterday after talking to her brother who suggested she start doing activities she enjoys.  Her caretaker states that she has been complaining of increased headaches throughout the day, decreased appetite, and increased fatigue.  She describes herself as a "little depressed" currently.  She has woken her caretaker up two or three times nightly worried, confused, and scared.  She also states that the patient is more paranoid than usual.  She has been taking Xanax BID and 50mg  Topamax QD.  She lists hair loss as an associated symptom.      Patient Active Problem List   Diagnosis Date Noted  . Arthritis of knee 09/17/2014  . Arthritis 08/26/2014  . Anxiety state, unspecified 08/26/2014  . Migraine 08/26/2014  . Mental retardation 08/26/2014   Past Medical History  Diagnosis Date  . Mental retardation     history restored after error with record merge      . Intestinal obstruction     as an infant history restored after error with record merge      . Arthritis     history restored after error with record merge      . Anxiety     history restored after error with record merge      . Fatty infiltration of liver     history restored after error with record merge       Past Surgical History  Procedure Laterality Date  . Knee dislocation surgery    . Abdominal hysterectomy    . Colonoscopy    . Polypectomy    . Sigmoidoscopy    .  Fused knee     No Known Allergies Prior to Admission medications   Medication Sig Start Date End Date Taking? Authorizing Provider  ALPRAZolam (XANAX) 0.25 MG tablet Take 1 tablet (0.25 mg total) by mouth 2 (two) times daily as needed for anxiety. 12/20/14  Yes [name], MD  cetirizine (ZYRTEC) 10 MG tablet TAKE 1 TABLET BY MOUTH EVERY DAY 10/26/14  Yes Leandrew Koyanagi, MD  mometasone (NASONEX) 50 MCG/ACT nasal spray Place 2 sprays into the nose daily. 11/01/14  Yes [name], MD  PARoxetine (PAXIL) 30 MG tablet Take 30mg  every other day 12/28/14  Yes [name], MD  topiramate (TOPAMAX) 50 MG tablet Take 1 tablet (50 mg total) by mouth 2 (two) times daily. 09/17/14  Yes [name], MD  cholecalciferol (VITAMIN D) 1000 UNITS tablet Take 1,000 Units by mouth daily.    Historical Provider, MD  clobetasol ointment (TEMOVATE) 5.78 % Apply 1 application topically 2 (two) times daily. Apply bid to itchy rash. Do not use on face or genitals or axillae Patient not taking: Reported on 01/07/2015 08/22/14   Orma Flaming, MD  HYDROcodone-homatropine [facility]) 5-1.5 MG/5ML syrup Take 5 mLs by mouth at bedtime. Patient not taking: Reported on 01/07/2015 10/04/14   Leandrew Koyanagi,  MD  Magnesium 250 MG TABS Take 250 mg by mouth daily.    Historical Provider, MD  montelukast (SINGULAIR) 10 MG tablet TAKE 1 TABLET BY MOUTH EVERY DAY AT BEDTIME Patient not taking: Reported on 01/07/2015 11/12/14   [name], MD   History   Social History  . Marital Status: Single    Spouse Name: n/a    Number of Children: 0  . Years of Education: N/A   Occupational History  . [employer]    Social History Main Topics  . Smoking status: Never Smoker   . Smokeless tobacco: Never Used  . Alcohol Use: No  . Drug Use: No  . Sexual Activity: No   Other Topics Concern  . Not on file   Social History Narrative   Lives with [name], a close friend of her mother, and [name]'s adult  son.         Review of Systems  Constitutional: Positive for activity change, appetite change and fatigue.  Neurological: Positive for headaches.  Psychiatric/Behavioral: Positive for sleep disturbance. The patient is nervous/anxious.     Objective:  Physical Exam  Constitutional: She is oriented to person, place, and time. She appears well-developed and well-nourished.  HENT:  Head: Normocephalic and atraumatic.  Eyes: EOM are normal.  Neck: Normal range of motion.  Cardiovascular: Normal rate, regular rhythm and normal heart sounds.  Exam reveals no gallop and no friction rub.   No murmur heard. Pulmonary/Chest: Effort normal.  Musculoskeletal: Normal range of motion.  Neurological: She is alert and oriented to person, place, and time.  Skin: Skin is warm and dry.  Psychiatric: She has a normal mood and affect. Her behavior is normal.  Nursing note and vitals reviewed.    BP 120/84 mmHg  Pulse 109  Temp(Src) 98.8 F (37.1 C) (Oral)  Resp 16  Ht 4\' 10"  (1.473 m)  Wt 149 lb 6.4 oz (67.767 kg)  BMI 31.23 kg/m2  SpO2 98% Assessment & Plan:  Patient is still somewhat despondent but better.  She will be getting more active with day activities twice a week.  Also, we will increase the Paxil to qhs and decrease the Xanax to once a day  Follow up in 3 weeks  [name], MD

## 2015-01-08 LAB — TSH: TSH: 1.513 u[IU]/mL (ref 0.350–4.500)

## 2015-01-08 LAB — VITAMIN D 25 HYDROXY (VIT D DEFICIENCY, FRACTURES): Vit D, 25-Hydroxy: 31 ng/mL (ref 30–100)

## 2015-01-22 ENCOUNTER — Telehealth: Payer: Self-pay

## 2015-01-22 NOTE — Telephone Encounter (Signed)
Pt's caretaker called to inform that pt is extremely confused. States employer has also noticed. Please advise. CB # [phone]

## 2015-01-23 ENCOUNTER — Ambulatory Visit (INDEPENDENT_AMBULATORY_CARE_PROVIDER_SITE_OTHER): Payer: Medicare Other | Admitting: Family Medicine

## 2015-01-23 VITALS — BP 112/80 | HR 88 | Temp 98.2°F | Resp 18 | Wt 146.8 lb

## 2015-01-23 DIAGNOSIS — R6889 Other general symptoms and signs: Secondary | ICD-10-CM | POA: Diagnosis not present

## 2015-01-23 DIAGNOSIS — R400 Somnolence: Secondary | ICD-10-CM

## 2015-01-23 DIAGNOSIS — F329 Major depressive disorder, single episode, unspecified: Secondary | ICD-10-CM | POA: Diagnosis not present

## 2015-01-23 DIAGNOSIS — F32A Depression, unspecified: Secondary | ICD-10-CM

## 2015-01-23 NOTE — Telephone Encounter (Signed)
Spoke with caretaker, [name], advised to RTC.

## 2015-01-23 NOTE — Patient Instructions (Signed)
Heather [name] is probably having memory loss from her Topamax. Therefore need to stop that for now.  You can continue the Paxil at one half tablet daily and will follow-up in 4 weeks.  I think that proceeding with psychological evaluation and keeping him busy is the best approach in the meantime.

## 2015-01-23 NOTE — Progress Notes (Signed)
This chart was scribed for Dr. [name], MD by Erling Conte, Medical Scribe. This patient was seen in Room 1 and the patient's care was started at 3:41 PM.   Patient ID: Heather [name] MRN: [id number], DOB: 07-14-1961, 54 y.o. Date of Encounter: 01/23/2015, 3:41 PM  Primary Physician: [name], MD  Chief Complaint:  No chief complaint on file.    HPI: 54 y.o. year old female with history below presents with medication issues with her Topomax. Pt states that the Topomax has been giving her headaches. Pt states it was causing short term memory loss. She has also been having some somnolence from the higher dose of Paxil. Pt has a h/o depression since her mother died. We have been adjusting her Paxil and trying to help her with her HAs that began around the same time as her depression.   Past Medical History  Diagnosis Date  . Mental retardation     history restored after error with record merge      . Intestinal obstruction     as an infant history restored after error with record merge      . Arthritis     history restored after error with record merge      . Anxiety     history restored after error with record merge      . Fatty infiltration of liver     history restored after error with record merge         Home Meds: Prior to Admission medications   Medication Sig Start Date End Date Taking? Authorizing Provider  ALPRAZolam (XANAX) 0.25 MG tablet Take 1 tablet (0.25 mg total) by mouth 2 (two) times daily as needed for anxiety. 01/07/15  Yes Stephanie D English, PA  cetirizine (ZYRTEC) 10 MG tablet TAKE 1 TABLET BY MOUTH EVERY DAY 10/26/14  Yes Leandrew Koyanagi, MD  cholecalciferol (VITAMIN D) 1000 UNITS tablet Take 1,000 Units by mouth daily.   Yes Historical Provider, MD  clobetasol ointment (TEMOVATE) 2.95 % Apply 1 application topically 2 (two) times daily. Apply bid to itchy rash. Do not use on face or genitals or axillae 08/22/14  Yes Orma Flaming, MD    HYDROcodone-homatropine [facility]) 5-1.5 MG/5ML syrup Take 5 mLs by mouth at bedtime. 10/04/14  Yes Leandrew Koyanagi, MD  Magnesium 250 MG TABS Take 250 mg by mouth daily.   Yes Historical Provider, MD  mometasone (NASONEX) 50 MCG/ACT nasal spray Place 2 sprays into the nose daily. 11/01/14  Yes [name], MD  montelukast (SINGULAIR) 10 MG tablet TAKE 1 TABLET BY MOUTH EVERY DAY AT BEDTIME 11/12/14  Yes [name], MD  PARoxetine (PAXIL) 30 MG tablet Take 30mg  every other day 12/28/14  Yes [name], MD    Allergies: No Known Allergies  History   Social History  . Marital Status: Single    Spouse Name: n/a    Number of Children: 0  . Years of Education: N/A   Occupational History  . [employer]    Social History Main Topics  . Smoking status: Never Smoker   . Smokeless tobacco: Never Used  . Alcohol Use: No  . Drug Use: No  . Sexual Activity: No   Other Topics Concern  . Not on file   Social History Narrative   Lives with [name], a close friend of her mother, and [name]'s adult son.         Review of Systems: Constitutional: negative for  chills, fever, night sweats, weight changes, or fatigue  HEENT: negative for vision changes, hearing loss, congestion, rhinorrhea, ST, epistaxis, or sinus pressure Cardiovascular: negative for chest pain or palpitations Respiratory: negative for hemoptysis, wheezing, shortness of breath, or cough Abdominal: negative for abdominal pain, nausea, vomiting, diarrhea, or constipation Dermatological: negative for rash Neurologic: negative for headache, dizziness, or syncope All other systems reviewed and are otherwise negative with the exception to those above and in the HPI.   Physical Exam: Blood pressure 112/80, pulse 88, temperature 98.2 F (36.8 C), temperature source Oral, resp. rate 18, weight 146 lb 12.8 oz (66.588 kg), SpO2 98 %., Body mass index is 30.69 kg/(m^2). General: Well developed, well  nourished, in no acute distress. Head: Normocephalic, atraumatic, eyes without discharge, sclera non-icteric, nares are without discharge. Bilateral auditory canals clear, TM's are without perforation, pearly grey and translucent with reflective cone of light bilaterally. Oral cavity moist, posterior pharynx without exudate, erythema, peritonsillar abscess, or post nasal drip.  Neck: Supple. No thyromegaly. Full ROM. No lymphadenopathy. Lungs: Clear bilaterally to auscultation without wheezes, rales, or rhonchi. Breathing is unlabored. Heart: RRR with S1 S2. No murmurs, rubs, or gallops appreciated. Abdomen: Soft, non-tender, non-distended with normoactive bowel sounds. No hepatomegaly. No rebound/guarding. No obvious abdominal masses. Msk:  Strength and tone normal for age. Extremities/Skin: Warm and dry. No clubbing or cyanosis. No edema. No rashes or suspicious lesions. Neuro: Alert and oriented X 3. Moves all extremities spontaneously. Gait is normal. CNII-XII grossly in tact. Psych:  Responds to questions appropriately with a normal affect.   Labs:   ASSESSMENT AND PLAN:  54 y.o. year old female with   1. Forgetfulness   2. Somnolence   3. Depression     Heather [name] is probably having memory loss from her Topamax. Therefore need to stop that for now.  You can continue the Paxil at one half tablet daily and will follow-up in 4 weeks.  I think that proceeding with psychological evaluation and keeping him busy is the best approach in the meantime.   This chart was scribed in my presence and reviewed by me personally.    ICD-9-CM ICD-10-CM   1. Forgetfulness 780.99 R68.89   2. Somnolence 780.09 R40.0   3. Depression 311 F32.9    Signed, [name], MD 01/23/2015 3:41 PM

## 2015-01-26 ENCOUNTER — Telehealth: Payer: Self-pay

## 2015-01-26 NOTE — Telephone Encounter (Signed)
Pt's caregiver called stating she received a call from pt's job, pt is hallucinating, seeing things, and cannot stop crying. I advised her to take pt to [facility] to get an evaluation. She agreed and will take pt when she picks her up. Dr. Braulio Bosch

## 2015-01-28 ENCOUNTER — Ambulatory Visit: Payer: Medicaid Other | Admitting: Family Medicine

## 2015-01-29 ENCOUNTER — Telehealth: Payer: Self-pay

## 2015-01-29 DIAGNOSIS — F419 Anxiety disorder, unspecified: Secondary | ICD-10-CM

## 2015-01-29 MED ORDER — PAROXETINE HCL 30 MG PO TABS: ORAL_TABLET | ORAL | Status: DC

## 2015-01-29 NOTE — Telephone Encounter (Signed)
Caregiver, [name], LM asking that pt's Rx for Paxil be changed to read the way Dr L has advised pt to take it now: 1/2 tablet QD, so that it will be clear to the new Psychiatrist that they are giving it to the pt the way it is intended by Dr. Per Dr Lenn Cal 01/23/15 OV notes, I sent in corrected Rx. Notified pt on VM

## 2015-02-25 ENCOUNTER — Ambulatory Visit: Payer: Medicare Other | Admitting: Family Medicine

## 2015-03-03 ENCOUNTER — Telehealth: Payer: Self-pay

## 2015-03-03 NOTE — Telephone Encounter (Signed)
Are you asking me to call Heather [name]?

## 2015-03-03 NOTE — Telephone Encounter (Signed)
Dr Michell Heinrich, the patient's caretaker called.  You had referred the patient to [facility] for aknee brace.  Then she was supposed to get the shoes she needed before Christmas.  She talked to him briefly once after Christmas because they had not heard anything.   He told her he was on the phone with the insurance company about the shoes and that he would call her right back.  She has not heard a thing She has called & called him several times since then and no reply.  Anything you can do to help? [phone]

## 2015-03-04 DIAGNOSIS — Z5181 Encounter for therapeutic drug level monitoring: Secondary | ICD-10-CM | POA: Diagnosis not present

## 2015-03-04 DIAGNOSIS — Z79899 Other long term (current) drug therapy: Secondary | ICD-10-CM | POA: Diagnosis not present

## 2015-03-04 NOTE — Telephone Encounter (Signed)
Can I write a letter of neccessity and have you sign. Can we try that?

## 2015-03-04 NOTE — Telephone Encounter (Signed)
I cannot do anything further

## 2015-03-04 NOTE — Telephone Encounter (Signed)
I called another facility to get information on this and the lady I spoke with states they are denying medicare claims for diabetic shoes left and right. She states the patient has to have severe diabetes and the physician has to do a comprehensive foot exam in order for them to pay. So Im guessing she cannot get the shoes because she does not even have a diagnosis for diabetes. FYI Dr. Clearence Cheek

## 2015-03-04 NOTE — Telephone Encounter (Signed)
Heather [name] works for [name] states he said the shoes are not medically necessary according to the insurance. I tried to call [name] at [phone], no answer and unable to leave voicemail. [name] just wants to know if [name] can get these shoes or not.

## 2015-03-05 NOTE — Telephone Encounter (Signed)
Pt's caretaker called to check on the status of the diabetic shoe request. I explained that a letter of medical necessity is being composed and we will update her when this is complete.

## 2015-03-05 NOTE — Telephone Encounter (Signed)
yes

## 2015-03-08 NOTE — Telephone Encounter (Signed)
I believes I signe this

## 2015-03-08 NOTE — Telephone Encounter (Signed)
Do you know if this has been signes yet.  Please advise

## 2015-03-15 ENCOUNTER — Telehealth: Payer: Self-pay

## 2015-03-15 NOTE — Telephone Encounter (Signed)
[name], patient's caretaker states she has been trying since December to get orthopedic shoes and has not gotten them yet. From the other phone messages, it looks like [name] from the ortho place is waiting on insurance to pay for the shoes. Patient has a steel rod in her leg and a callus on her leg from wearing the wrong shoes. Can we contact [name] again? Maybe send an email if we cant get him on the phone? Has the letter been sent yet? If we send something, is there any way that we can get a confirmation? Please call back with status of shoes at [phone] ( work # will be there from 9-3 today)

## 2015-03-16 NOTE — Telephone Encounter (Signed)
[name], pt's caretaker is calling to check status of this phone message. CB#[phone] or [phone]

## 2015-03-16 NOTE — Telephone Encounter (Signed)
Dr. Carlean Jews go into letters and see if the letter i wrote is ok to send to [facility]. You can add or edit if you would like.

## 2015-03-16 NOTE — Telephone Encounter (Signed)
The letter written on 03/16/2015 is fine

## 2015-03-17 NOTE — Telephone Encounter (Signed)
Ok in your box to sign.

## 2015-04-14 ENCOUNTER — Ambulatory Visit (INDEPENDENT_AMBULATORY_CARE_PROVIDER_SITE_OTHER): Payer: Medicare Other | Admitting: Physician Assistant

## 2015-04-14 ENCOUNTER — Ambulatory Visit (INDEPENDENT_AMBULATORY_CARE_PROVIDER_SITE_OTHER): Payer: Medicare Other

## 2015-04-14 VITALS — BP 122/80 | HR 75 | Temp 98.0°F | Resp 18 | Ht <= 58 in | Wt 145.8 lb

## 2015-04-14 DIAGNOSIS — K5900 Constipation, unspecified: Secondary | ICD-10-CM | POA: Diagnosis not present

## 2015-04-14 DIAGNOSIS — R1030 Lower abdominal pain, unspecified: Secondary | ICD-10-CM

## 2015-04-14 LAB — POCT CBC
Granulocyte percent: 49.8 %G (ref 37–80)
HCT, POC: 39 % (ref 37.7–47.9)
Hemoglobin: 12.7 g/dL (ref 12.2–16.2)
Lymph, poc: 1.6 (ref 0.6–3.4)
MCH, POC: 30.9 pg (ref 27–31.2)
MCHC: 32.7 g/dL (ref 31.8–35.4)
MCV: 94.6 fL (ref 80–97)
MID (cbc): 0.2 (ref 0–0.9)
MPV: 9.9 fL (ref 0–99.8)
POC Granulocyte: 1.8 — AB (ref 2–6.9)
POC LYMPH PERCENT: 44.3 %L (ref 10–50)
POC MID %: 5.9 %M (ref 0–12)
Platelet Count, POC: 134 10*3/uL — AB (ref 142–424)
RBC: 4.12 M/uL (ref 4.04–5.48)
RDW, POC: 13.9 %
WBC: 3.7 10*3/uL — AB (ref 4.6–10.2)

## 2015-04-14 LAB — POCT URINALYSIS DIPSTICK
Bilirubin, UA: NEGATIVE
Blood, UA: NEGATIVE
Glucose, UA: NEGATIVE
Ketones, UA: NEGATIVE
Nitrite, UA: NEGATIVE
Protein, UA: NEGATIVE
Spec Grav, UA: 1.02
Urobilinogen, UA: 0.2
pH, UA: 5.5

## 2015-04-14 LAB — POCT UA - MICROSCOPIC ONLY
Casts, Ur, LPF, POC: NEGATIVE
Crystals, Ur, HPF, POC: NEGATIVE
Mucus, UA: NEGATIVE
RBC, urine, microscopic: NEGATIVE
Yeast, UA: NEGATIVE

## 2015-04-14 MED ORDER — POLYETHYLENE GLYCOL 3350 17 GM/SCOOP PO POWD: 17.0000 g | Freq: Three times a day (TID) | ORAL | Status: DC

## 2015-04-14 NOTE — Patient Instructions (Addendum)
Please attempt to take miralax three times per day as needed. Please contact me if you continue to have abdominal pain.Constipation Constipation is when a person has fewer than three bowel movements a week, has difficulty having a bowel movement, or has stools that are dry, hard, or larger than normal. As people grow older, constipation is more common. If you try to fix constipation with medicines that make you have a bowel movement (laxatives), the problem may get worse. Long-term laxative use may cause the muscles of the colon to become weak. A low-fiber diet, not taking in enough fluids, and taking certain medicines may make constipation worse.  CAUSES   Certain medicines, such as antidepressants, pain medicine, iron supplements, antacids, and water pills.   Certain diseases, such as diabetes, irritable bowel syndrome (IBS), thyroid disease, or depression.   Not drinking enough water.   Not eating enough fiber-rich foods.   Stress or travel.   Lack of physical activity or exercise.   Ignoring the urge to have a bowel movement.   Using laxatives too much.  SIGNS AND SYMPTOMS   Having fewer than three bowel movements a week.   Straining to have a bowel movement.   Having stools that are hard, dry, or larger than normal.   Feeling full or bloated.   Pain in the lower abdomen.   Not feeling relief after having a bowel movement.  DIAGNOSIS  Your health care provider will take a medical history and perform a physical exam. Further testing may be done for severe constipation. Some tests may include:  A barium enema X-ray to examine your rectum, colon, and, sometimes, your small intestine.   A sigmoidoscopy to examine your lower colon.   A colonoscopy to examine your entire colon. TREATMENT  Treatment will depend on the severity of your constipation and what is causing it. Some dietary treatments include drinking more fluids and eating more fiber-rich foods.  Lifestyle treatments may include regular exercise. If these diet and lifestyle recommendations do not help, your health care provider may recommend taking over-the-counter laxative medicines to help you have bowel movements. Prescription medicines may be prescribed if over-the-counter medicines do not work.  HOME CARE INSTRUCTIONS   Eat foods that have a lot of fiber, such as fruits, vegetables, whole grains, and beans.  Limit foods high in fat and processed sugars, such as french fries, hamburgers, cookies, candies, and soda.   A fiber supplement may be added to your diet if you cannot get enough fiber from foods.   Drink enough fluids to keep your urine clear or pale yellow.   Exercise regularly or as directed by your health care provider.   Go to the restroom when you have the urge to go. Do not hold it.   Only take over-the-counter or prescription medicines as directed by your health care provider. Do not take other medicines for constipation without talking to your health care provider first.  [place] IF:   You have bright red blood in your stool.   Your constipation lasts for more than 4 days or gets worse.   You have abdominal or rectal pain.   You have thin, pencil-like stools.   You have unexplained weight loss. MAKE SURE YOU:   Understand these instructions.  Will watch your condition.  Will get help right away if you are not doing well or get worse. Document Released: 09/08/2004 Document Revised: 12/16/2013 Document Reviewed: 09/22/2013 [facility] Patient Information 2015 [place], [name]. This information  is not intended to replace advice given to you by your health care provider. Make sure you discuss any questions you have with your health care provider.  

## 2015-04-14 NOTE — Progress Notes (Signed)
[facility] [address] [place] [address] [phone]  Date:  04/14/2015   Name:  Heather [name]   DOB:  30-Dec-1960   MRN:  [id number]  PCP:  [name], MD    Chief Complaint: Abdominal Pain and Groin Pain   History of Present Illness:  Heather [name] is a 54 y.o. very pleasant female patient who presents with the following:  1.5-2 weeks of lower abdominal pain.  She reports bowel movement every day that appears less than normal.  There is no blood in the stool.  She denies pain with urination or hematuria.  She drinks a great amount of water.  She has no fever or chills.  She has little appetite and often the food will aggravate the abdominal pain.  Pain sits low at the bottom of the stomach.  She has no abnormal discharge, odor, or vaginal bleeding.  She also denies nausea or vomiting.  There is no dizziness, or difficulty breathing.     Patient Active Problem List   Diagnosis Date Noted  . Arthritis of knee 09/17/2014  . Arthritis 08/26/2014  . Anxiety state, unspecified 08/26/2014  . Migraine 08/26/2014  . Mental retardation 08/26/2014    Past Medical History  Diagnosis Date  . Mental retardation     history restored after error with record merge      . Intestinal obstruction     as an infant history restored after error with record merge      . Arthritis     history restored after error with record merge      . Anxiety     history restored after error with record merge      . Fatty infiltration of liver     history restored after error with record merge        Past Surgical History  Procedure Laterality Date  . Knee dislocation surgery    . Abdominal hysterectomy    . Colonoscopy    . Polypectomy    . Sigmoidoscopy    . Fused knee      History  Substance Use Topics  . Smoking status: Never Smoker   . Smokeless tobacco: Never Used  . Alcohol Use: No    Family History  Problem Relation Age of Onset  . Diabetes  Mother     history restored after error with record merge        No Known Allergies  Medication list has been reviewed and updated.  Current Outpatient Prescriptions on File Prior to Visit  Medication Sig Dispense Refill  . ALPRAZolam (XANAX) 0.25 MG tablet Take 1 tablet (0.25 mg total) by mouth 2 (two) times daily as needed for anxiety. 30 tablet 0  . cetirizine (ZYRTEC) 10 MG tablet TAKE 1 TABLET BY MOUTH EVERY DAY 30 tablet 10  . mometasone (NASONEX) 50 MCG/ACT nasal spray Place 2 sprays into the nose daily. 17 g 2  . PARoxetine (PAXIL) 30 MG tablet Take 1/2 tablet once a day. 30 tablet 5  . cholecalciferol (VITAMIN D) 1000 UNITS tablet Take 1,000 Units by mouth daily.    . clobetasol ointment (TEMOVATE) 0.08 % Apply 1 application topically 2 (two) times daily. Apply bid to itchy rash. Do not use on face or genitals or axillae (Patient not taking: Reported on 04/14/2015) 60 g 1  . HYDROcodone-homatropine (HYCODAN) 5-1.5 MG/5ML syrup Take 5 mLs by mouth at bedtime. (Patient not taking: Reported on 04/14/2015) 30 mL 0  .  Magnesium 250 MG TABS Take 250 mg by mouth daily.    . montelukast (SINGULAIR) 10 MG tablet TAKE 1 TABLET BY MOUTH EVERY DAY AT BEDTIME (Patient not taking: Reported on 04/14/2015) 30 tablet 11   No current facility-administered medications on file prior to visit.    Review of Systems: ROS otherwise unremarkable unless listed above.  Physical Examination: Filed Vitals:   04/14/15 1549  BP: 122/80  Pulse: 75  Temp: 98 F (36.7 C)  Resp: 18   Filed Vitals:   04/14/15 1549  Height: 4\' 10"  (1.473 m)  Weight: 145 lb 12.8 oz (66.134 kg)   Body mass index is 30.48 kg/(m^2). Ideal Body Weight: Weight in (lb) to have BMI = 25: 119.4  Physical Exam  Constitutional: She is oriented to person, place, and time. She appears well-developed and well-nourished. No distress.  HENT:  Head: Normocephalic.  Eyes: EOM are normal. Pupils are equal, round, and reactive to light.   Neck: Normal range of motion.  Cardiovascular: Normal rate, regular rhythm and normal heart sounds.  Exam reveals no friction rub.   No murmur heard. Pulmonary/Chest: Effort normal and breath sounds normal. No respiratory distress. She has no wheezes.  Abdominal: Soft. Bowel sounds are normal. She exhibits no mass. There is no hepatosplenomegaly. There is tenderness in the suprapubic area and left lower quadrant. There is no CVA tenderness, no tenderness at McBurney's point and negative Murphy's sign.  Neurological: She is alert and oriented to person, place, and time.  Skin: Skin is warm. She is not diaphoretic.  Psychiatric: She has a normal mood and affect. Her behavior is normal.     Results for orders placed or performed in visit on 04/14/15  POCT CBC  Result Value Ref Range   WBC 3.7 (A) 4.6 - 10.2 K/uL   Lymph, poc 1.6 0.6 - 3.4   POC LYMPH PERCENT 44.3 10 - 50 %L   MID (cbc) 0.2 0 - 0.9   POC MID % 5.9 0 - 12 %M   POC Granulocyte 1.8 (A) 2 - 6.9   Granulocyte percent 49.8 37 - 80 %G   RBC 4.12 4.04 - 5.48 M/uL   Hemoglobin 12.7 12.2 - 16.2 g/dL   HCT, POC 39.0 37.7 - 47.9 %   MCV 94.6 80 - 97 fL   MCH, POC 30.9 27 - 31.2 pg   MCHC 32.7 31.8 - 35.4 g/dL   RDW, POC 13.9 %   Platelet Count, POC 134 (A) 142 - 424 K/uL   MPV 9.9 0 - 99.8 fL  POCT UA - Microscopic Only  Result Value Ref Range   WBC, Ur, HPF, POC 0-2    RBC, urine, microscopic neg    Bacteria, U Microscopic trace    Mucus, UA neg    Epithelial cells, urine per micros 0-2    Crystals, Ur, HPF, POC neg    Casts, Ur, LPF, POC neg    Yeast, UA neg   POCT urinalysis dipstick  Result Value Ref Range   Color, UA yellow    Clarity, UA clear    Glucose, UA neg    Bilirubin, UA neg    Ketones, UA neg    Spec Grav, UA 1.020    Blood, UA neg    pH, UA 5.5    Protein, UA neg    Urobilinogen, UA 0.2    Nitrite, UA neg    Leukocytes, UA Trace    [facility] reading (PRIMARY) by  Dr.  Lauenstein: Marked scoliosis,  with large stool burden.     Assessment and Plan: 54 year old female is here today for chief complaint of abdominal pain.  This appears likely constipation by imaging.  I have placed her on Miralax 3 times per day until patient stool is consistently softer.  rtc if sxs continue.  Constipation, unspecified constipation type - Plan: polyethylene glycol powder (GLYCOLAX/MIRALAX) powder  Lower abdominal pain - Plan: POCT CBC, POCT UA - Microscopic Only, POCT urinalysis dipstick, DG Abd 2 Views  Ivar Drape, PA-C [facility] 4/26/201610:33 AM

## 2015-04-21 DIAGNOSIS — Z79899 Other long term (current) drug therapy: Secondary | ICD-10-CM | POA: Diagnosis not present

## 2015-04-21 DIAGNOSIS — Z5181 Encounter for therapeutic drug level monitoring: Secondary | ICD-10-CM | POA: Diagnosis not present

## 2015-04-22 ENCOUNTER — Telehealth: Payer: Self-pay | Admitting: Physician Assistant

## 2015-04-22 NOTE — Telephone Encounter (Signed)
[name] states that the patient was doing better.  She was treating [place] with milk of magnesia.  But today, she complains of increased abdominal pain today.  She has not had a bowel movement today.  They have not picked up the miralax per my previous request at visit.  [name] will pick it up now.  If this does not resolve the symptoms, will return to the clinic.

## 2015-05-19 DIAGNOSIS — Z79899 Other long term (current) drug therapy: Secondary | ICD-10-CM | POA: Diagnosis not present

## 2015-05-19 DIAGNOSIS — Z5181 Encounter for therapeutic drug level monitoring: Secondary | ICD-10-CM | POA: Diagnosis not present

## 2015-06-24 DIAGNOSIS — Z5181 Encounter for therapeutic drug level monitoring: Secondary | ICD-10-CM | POA: Diagnosis not present

## 2015-06-24 DIAGNOSIS — Z79899 Other long term (current) drug therapy: Secondary | ICD-10-CM | POA: Diagnosis not present

## 2015-06-25 ENCOUNTER — Telehealth: Payer: Self-pay

## 2015-06-25 NOTE — Telephone Encounter (Signed)
I called number below, it is a fax machine. Called the number on file, no answer.

## 2015-06-25 NOTE — Telephone Encounter (Signed)
Pt care giver beverly varner is needing to talk with dr Jacinto Reap about her orthodic shoes this has been going on since march and patient states that the person helping with this is not relieable and they need help getting patient shoes   Best number [phone] til [phone]

## 2015-06-30 NOTE — Telephone Encounter (Signed)
Spoke with pt's caregiver, she states the shoes were approved but she is unable to reach [facility]. She has called and emailed him with no response. I called Heather [name] and he actually answered the phone. He states he he will call her this afternoon for sure. Called [place] to let her know.

## 2015-07-13 ENCOUNTER — Telehealth: Payer: Self-pay

## 2015-07-13 NOTE — Telephone Encounter (Signed)
Patient's care giver [name] requesting another facility they could get patient a leg brace and shoes. She is currently working with [employer] and feels they are not getting any help. [name]'s call back number is [phone]

## 2015-07-13 NOTE — Telephone Encounter (Signed)
We have been having an issue with this place. See previous messages. Does anyone know where else they can get these diabetic shoes from?

## 2015-07-14 NOTE — Telephone Encounter (Signed)
I am not sure who [facility] is - I typically use [facility] on [address] at [address] near [address].

## 2015-07-19 NOTE — Telephone Encounter (Signed)
Left message for pt to call back  °

## 2015-07-26 NOTE — Telephone Encounter (Signed)
Pt's caregiver called wanting [place] to call Medicare to see if shoes has been paid. I will try to call [name] today.

## 2015-08-02 ENCOUNTER — Telehealth: Payer: Self-pay

## 2015-08-02 NOTE — Telephone Encounter (Signed)
Heather [name] called on behalf of the patient about an order for diabetic shoes that has not been received.  The patient was referred to [name] at [facility], but her request was denied because she was not diabetic.  Dr. Joseph Art wrote a letter of medical necessity for the shoes in March 2016.  The patient has not received her shoes, or heard anything from the company since then.  She wants to know if a referral to another medical supply company needs to be done, or if someone needs to contact Medicare and resend Dr. Pauletta Browns letter.  Please advise.  Thank you.  CB# for Heather [name]: [phone]

## 2015-08-03 DIAGNOSIS — Z79899 Other long term (current) drug therapy: Secondary | ICD-10-CM | POA: Diagnosis not present

## 2015-08-03 DIAGNOSIS — Z5181 Encounter for therapeutic drug level monitoring: Secondary | ICD-10-CM | POA: Diagnosis not present

## 2015-08-03 NOTE — Telephone Encounter (Signed)
I called [name] today at the [facility]. He states pt has had 3 different pairs of shoes that were given and [name] did not like them. Luckily he was able to return the shoes and states he will call her today or sometime this week. It seems to be a miscommunication about this whole situation.

## 2015-08-04 NOTE — Telephone Encounter (Signed)
Called pt, Left message for pt to call back.  

## 2015-08-04 NOTE — Telephone Encounter (Signed)
I spoke with Heather [name] and I advised her to find another facility and I can make sure they receive the shoes. She is going to check with a lady from her church that may have the info.

## 2015-09-24 DIAGNOSIS — Z79891 Long term (current) use of opiate analgesic: Secondary | ICD-10-CM | POA: Diagnosis not present

## 2015-09-24 DIAGNOSIS — F329 Major depressive disorder, single episode, unspecified: Secondary | ICD-10-CM | POA: Diagnosis not present

## 2015-10-22 DIAGNOSIS — Z5181 Encounter for therapeutic drug level monitoring: Secondary | ICD-10-CM | POA: Diagnosis not present

## 2015-10-22 DIAGNOSIS — Z79899 Other long term (current) drug therapy: Secondary | ICD-10-CM | POA: Diagnosis not present

## 2015-11-09 ENCOUNTER — Encounter: Payer: Self-pay | Admitting: Physician Assistant

## 2015-11-23 ENCOUNTER — Ambulatory Visit (INDEPENDENT_AMBULATORY_CARE_PROVIDER_SITE_OTHER): Payer: Medicare Other | Admitting: Physician Assistant

## 2015-11-23 ENCOUNTER — Encounter: Payer: Self-pay | Admitting: Physician Assistant

## 2015-11-23 VITALS — BP 97/64 | HR 78 | Temp 98.7°F | Resp 16 | Ht 58.75 in | Wt 138.6 lb

## 2015-11-23 DIAGNOSIS — J309 Allergic rhinitis, unspecified: Secondary | ICD-10-CM | POA: Diagnosis not present

## 2015-11-23 DIAGNOSIS — F32A Depression, unspecified: Secondary | ICD-10-CM

## 2015-11-23 DIAGNOSIS — Z86010 Personal history of colonic polyps: Secondary | ICD-10-CM

## 2015-11-23 DIAGNOSIS — Z13228 Encounter for screening for other metabolic disorders: Secondary | ICD-10-CM

## 2015-11-23 DIAGNOSIS — Z1211 Encounter for screening for malignant neoplasm of colon: Secondary | ICD-10-CM

## 2015-11-23 DIAGNOSIS — F329 Major depressive disorder, single episode, unspecified: Secondary | ICD-10-CM | POA: Diagnosis not present

## 2015-11-23 DIAGNOSIS — Z1159 Encounter for screening for other viral diseases: Secondary | ICD-10-CM | POA: Diagnosis not present

## 2015-11-23 DIAGNOSIS — F419 Anxiety disorder, unspecified: Secondary | ICD-10-CM | POA: Diagnosis not present

## 2015-11-23 DIAGNOSIS — Z23 Encounter for immunization: Secondary | ICD-10-CM

## 2015-11-23 DIAGNOSIS — Z1329 Encounter for screening for other suspected endocrine disorder: Secondary | ICD-10-CM

## 2015-11-23 DIAGNOSIS — K5900 Constipation, unspecified: Secondary | ICD-10-CM | POA: Diagnosis not present

## 2015-11-23 DIAGNOSIS — Z114 Encounter for screening for human immunodeficiency virus [HIV]: Secondary | ICD-10-CM | POA: Diagnosis not present

## 2015-11-23 DIAGNOSIS — Z13220 Encounter for screening for lipoid disorders: Secondary | ICD-10-CM

## 2015-11-23 DIAGNOSIS — Z130 Encounter for screening for diseases of the blood and blood-forming organs and certain disorders involving the immune mechanism: Secondary | ICD-10-CM

## 2015-11-23 MED ORDER — PAROXETINE HCL 20 MG PO TABS: 20.0000 mg | ORAL_TABLET | Freq: Every day | ORAL | Status: DC

## 2015-11-23 MED ORDER — ALPRAZOLAM 0.25 MG PO TABS: 0.2500 mg | ORAL_TABLET | Freq: Two times a day (BID) | ORAL | Status: DC | PRN

## 2015-11-23 MED ORDER — CETIRIZINE HCL 10 MG PO TABS: 10.0000 mg | ORAL_TABLET | Freq: Every day | ORAL | Status: DC

## 2015-11-23 NOTE — Patient Instructions (Signed)
Try restarting the Miralax, but just one time each day.

## 2015-11-23 NOTE — Progress Notes (Signed)
Subjective:    Patient ID: Heather [name], female    DOB: 16-Nov-1961, 54 y.o.   MRN: [id number]  Chief Complaint  Patient presents with  . Follow-up    constipation   . Immunizations    flu vaccine  . Medication Refill    xanax, zyrtec and paxil   HPI Patient presents today for follow-up evaluation of constipation, medication refills, and her annual flu vaccination.   Last seen by Ivar Drape, PA-C in April, at which time Miralax was recommended. Patient had good relief using Miralax 3x/day, but started having BMs constantly throughout the day and at night, so she stopped taking it. Now having 2 BMs/day, but stools are very hard to pass. Complains of occasional stomach gas, but denies abdominal pain, fevers, chills, N/V, bloody stools, diarrhea, dysuria, or hematuria. Has also tried stool softeners intermittently with good relief.   She drinks approximately 32 ounces of water per day and consumes a good amount of fiber-rich foods, including oatmeal, bananas, yogurt, and whole wheat bread. Recently started water aerobics class once a week and believes the exercise has helped her constipation.   Patient is also requesting refills on her Xanax, Paxil, and Zyrtec today. Currently takes half a 0.25 mg pill of Xanax at bedtime, and occasionally another half when she is extra anxious or agitated. Currently takes 20 mg of Paxil daily without any complaints and 10 mg of Zyrtec daily with good allergy relief.   Patient is here today with [name], a close family friend and the patient's primary caretaker. [name] has provided much of the patient's history on today's visit.   Review of Systems  Constitutional: Positive for appetite change (increased; just started a once weekly water aerobics class). Negative for fever, chills, activity change, fatigue and unexpected weight change.  Gastrointestinal: Positive for constipation. Negative for nausea, vomiting, abdominal pain, diarrhea and  blood in stool.  Genitourinary: Negative for dysuria and hematuria.  Musculoskeletal: Negative for back pain.       Occasional b/l ankle swelling; resolves on its own at night  Allergic/Immunologic: Positive for environmental allergies.  Psychiatric/Behavioral: Positive for agitation (occasional). Negative for suicidal ideas, sleep disturbance and decreased concentration. The patient is nervous/anxious (occasionally).    Patient Active Problem List   Diagnosis Date Noted  . Arthritis of knee 09/17/2014  . Arthritis 08/26/2014  . Anxiety state, unspecified 08/26/2014  . Migraine 08/26/2014  . Mental retardation 08/26/2014   Family History  Problem Relation Age of Onset  . Diabetes Mother     history restored after error with record merge       Social History   Social History  . Marital Status: Single    Spouse Name: n/a  . Number of Children: 0  . Years of Education: N/A   Occupational History  . [employer]    Social History Main Topics  . Smoking status: Never Smoker   . Smokeless tobacco: Never Used  . Alcohol Use: No  . Drug Use: No  . Sexual Activity: No   Other Topics Concern  . Not on file   Social History Narrative   Lives with [name], a close friend of her mother, and [name]'s adult son.       Prior to Admission medications   Medication Sig Start Date End Date Taking? Authorizing Provider  ALPRAZolam (XANAX) 0.25 MG tablet Take 1 tablet (0.25 mg total) by mouth 2 (two) times daily as needed for anxiety. 01/07/15  Yes Colletta [place] D  English, PA  cetirizine (ZYRTEC) 10 MG tablet TAKE 1 TABLET BY MOUTH EVERY DAY 10/26/14  Yes Leandrew Koyanagi, MD  PARoxetine (PAXIL) 30 MG tablet Take 1/2 tablet once a day. 01/29/15  Yes [name], MD  cholecalciferol (VITAMIN D) 1000 UNITS tablet Take 1,000 Units by mouth daily.    Historical Provider, MD  Magnesium 250 MG TABS Take 250 mg by mouth daily.    Historical Provider, MD   No Known Allergies      Objective:   Physical Exam  Constitutional: She is oriented to person, place, and time. She appears well-developed and well-nourished. No distress.  Kyphotic posture with scoliosis; right leg held straight out in extension while sitting on exam table.   HENT:  Head: Normocephalic and atraumatic.  Eyes: EOM are normal. No scleral icterus.  Neck: Neck supple.  Cardiovascular: Normal rate, regular rhythm and normal heart sounds.  Exam reveals no gallop and no friction rub.   No murmur heard. 2+ DP and PT pulses b/l.  Pulmonary/Chest: Effort normal and breath sounds normal. No respiratory distress. She has no wheezes. She has no rales.  Abdominal: Soft. Bowel sounds are normal. She exhibits no distension and no mass. There is no tenderness.  Lymphadenopathy:    She has no cervical adenopathy.  Neurological: She is alert and oriented to person, place, and time.  Skin: Skin is warm and dry. No rash noted. She is not diaphoretic. No erythema.  Psychiatric:  Patient has some cognitive deficits consistent with mental retardation, but overall pleasant and appropriate.    BP 97/64 mmHg  Pulse 78  Temp(Src) 98.7 F (37.1 C) (Oral)  Resp 16  Ht 4' 10.75" (1.492 m)  Wt 138 lb 9.6 oz (62.869 kg)  BMI 28.24 kg/m2  SpO2 96%    Assessment & Plan:  1. Constipation, unspecified constipation type - Due for colonoscopy d/t prior colonic polyp found on flexible sigmoidoscopy in 2013. GI will also evaluate patient's constipation with colonoscopy.  - Ambulatory referral to Gastroenterology  2. Anxiety - Continue current medication regimen. Xanax and Paxil both refilled.  - ALPRAZolam (XANAX) 0.25 MG tablet; Take 1 tablet (0.25 mg total) by mouth 2 (two) times daily as needed for anxiety.  Dispense: 30 tablet; Refill: 0 - PARoxetine (PAXIL) 20 MG tablet; Take 1 tablet (20 mg total) by mouth daily.  Dispense: 90 tablet; Refill: 3  3. Depression - Continue Paxil 20 mg daily as prescribed.   4.  Allergic rhinitis, unspecified allergic rhinitis type - Continue current medication regimen. Zyrtec refilled.  - cetirizine (ZYRTEC) 10 MG tablet; Take 1 tablet (10 mg total) by mouth daily.  Dispense: 90 tablet; Refill: 3  5. Need for prophylactic vaccination and inoculation against influenza - Flu Vaccine QUAD 36+ mos IM  6. Screening for colon cancer - Ambulatory referral to Gastroenterology  7. History of colonic polyps - Ambulatory referral to Gastroenterology  8. Screening for hyperlipidemia - Lipid panel; Future  9. Screening for deficiency anemia - CBC with Differential/Platelet; Future  10. Screening for thyroid disorder - TSH; Future  11. Screening for HIV (human immunodeficiency virus) - HIV antibody; Future  12. Need for hepatitis C screening test - Hepatitis C antibody; Future  13. Screening for metabolic disorder - Comprehensive metabolic panel; Future

## 2015-11-24 ENCOUNTER — Telehealth: Payer: Self-pay | Admitting: Gastroenterology

## 2015-11-24 ENCOUNTER — Encounter: Payer: Self-pay | Admitting: Gastroenterology

## 2015-11-24 NOTE — Progress Notes (Signed)
Patient ID: Heather [name], female    DOB: Sep 06, 1961, 54 y.o.   MRN: [id number]  PCP: Elvina Sidle, MD  Subjective:   Chief Complaint  Patient presents with  . Follow-up    constipation   . Immunizations    flu vaccine  . Medication Refill    xanax, zyrtec and paxil    HPI Presents for evaluation of constipation, medication refills, and her annual flu vaccination.   Last seen by Trena Platt, PA-C in April, at which time Miralax was recommended. Patient had good relief using Miralax 3x/day, but started having BMs constantly throughout the day and at night, so she stopped taking it. Now having 2 BMs/day, but stools are very hard to pass. Complains of occasional stomach gas, but denies abdominal pain, fevers, chills, N/V, bloody stools, diarrhea, dysuria, or hematuria. Has also tried stool softeners intermittently with good relief.   She drinks approximately 32 ounces of water per day and consumes a good amount of fiber-rich foods, including oatmeal, bananas, yogurt, and whole wheat bread. Recently started water aerobics class once a week and believes the exercise has helped her constipation.   Patient is also requesting refills on her Xanax, Paxil, and Zyrtec today. Currently takes half a 0.25 mg pill of Xanax at bedtime, and occasionally another half when she is extra anxious or agitated. Currently takes 20 mg of Paxil daily without any complaints and 10 mg of Zyrtec daily with good allergy relief.  Patient is here today with [name], a close family friend and the patient's primary caretaker. [name] has provided much of the patient's history on today's visit.    Review of Systems Constitutional: Positive for appetite change (increased; just started a once weekly water aerobics class). Negative for fever, chills, activity change, fatigue and unexpected weight change.  Gastrointestinal: Positive for constipation. Negative for nausea, vomiting, abdominal pain, diarrhea  and blood in stool.  Genitourinary: Negative for dysuria and hematuria.  Musculoskeletal: Negative for back pain.  Occasional b/l ankle swelling; resolves on its own at night  Allergic/Immunologic: Positive for environmental allergies.  Psychiatric/Behavioral: Positive for agitation (occasional). Negative for suicidal ideas, sleep disturbance and decreased concentration. The patient is nervous/anxious (occasionally).      Patient Active Problem List   Diagnosis Date Noted  . Arthritis of knee 09/17/2014  . Arthritis 08/26/2014  . Anxiety state, unspecified 08/26/2014  . Migraine 08/26/2014  . Mental retardation 08/26/2014     Prior to Admission medications   Medication Sig Start Date End Date Taking? Authorizing Provider  ALPRAZolam (XANAX) 0.25 MG tablet Take 1 tablet (0.25 mg total) by mouth 2 (two) times daily as needed for anxiety. 11/23/15  Yes Biran Mayberry, PA-C  cetirizine (ZYRTEC) 10 MG tablet Take 1 tablet (10 mg total) by mouth daily. 11/23/15  Yes Seferina Brokaw, PA-C  PARoxetine (PAXIL) 20 MG tablet Take 1 tablet (20 mg total) by mouth daily. 11/23/15  Yes Maylie Ashton, PA-C  cholecalciferol (VITAMIN D) 1000 UNITS tablet Take 1,000 Units by mouth daily.    Historical Provider, MD  clobetasol ointment (TEMOVATE) 0.05 % Apply 1 application topically 2 (two) times daily. Apply bid to itchy rash. Do not use on face or genitals or axillae Patient not taking: Reported on 04/14/2015 08/22/14   Jonita Albee, MD  Magnesium 250 MG TABS Take 250 mg by mouth daily.    Historical Provider, MD  mometasone (NASONEX) 50 MCG/ACT nasal spray Place 2 sprays into the nose daily. Patient not taking: Reported  on 11/23/2015 11/01/14   Shade Flood, MD  montelukast (SINGULAIR) 10 MG tablet TAKE 1 TABLET BY MOUTH EVERY DAY AT BEDTIME Patient not taking: Reported on 04/14/2015 11/12/14   Shade Flood, MD  polyethylene glycol powder (GLYCOLAX/MIRALAX) powder Take 17 g by mouth 3 (three)  times daily. Patient not taking: Reported on 11/23/2015 04/14/15   Collie Siad English, PA     No Known Allergies     Objective:  Physical Exam  Constitutional: She is oriented to person, place, and time. Vital signs are normal. She appears well-developed and well-nourished. She is active and cooperative. No distress.  BP 97/64 mmHg  Pulse 78  Temp(Src) 98.7 F (37.1 C) (Oral)  Resp 16  Ht 4' 10.75" (1.492 m)  Wt 138 lb 9.6 oz (62.869 kg)  BMI 28.24 kg/m2  SpO2 96%  HENT:  Head: Normocephalic and atraumatic.  Right Ear: Hearing normal.  Left Ear: Hearing normal.  Eyes: Conjunctivae are normal. No scleral icterus.  Neck: Normal range of motion. Neck supple. No thyromegaly present.  Cardiovascular: Normal rate, regular rhythm and normal heart sounds.   Pulses:      Radial pulses are 2+ on the right side, and 2+ on the left side.  Pulmonary/Chest: Effort normal and breath sounds normal.  Musculoskeletal:  Mild kyphosis. RIGHT leg is extended with rod placement  inversion of the RIGHT foot.  Lymphadenopathy:       Head (right side): No tonsillar, no preauricular, no posterior auricular and no occipital adenopathy present.       Head (left side): No tonsillar, no preauricular, no posterior auricular and no occipital adenopathy present.    She has no cervical adenopathy.       Right: No supraclavicular adenopathy present.       Left: No supraclavicular adenopathy present.  Neurological: She is alert and oriented to person, place, and time. No sensory deficit.  Cognitive deficits are baseline. She is conversant, easy to understand and engages well.  Skin: Skin is warm, dry and intact. No rash noted. No cyanosis or erythema. Nails show no clubbing.  Psychiatric: She has a normal mood and affect. Her speech is normal and behavior is normal.           Assessment & Plan:   1. Constipation, unspecified constipation type Advised her to resume the Miralax, but only once each  day. She can increase or reduce the dose/frequency as needed. She is also due for follow-up colonoscopy. - Ambulatory referral to Gastroenterology  2. Anxiety 3. Depression Stable. Controlled. Continue current treatment. - ALPRAZolam (XANAX) 0.25 MG tablet; Take 1 tablet (0.25 mg total) by mouth 2 (two) times daily as needed for anxiety.  Dispense: 30 tablet; Refill: 0 - PARoxetine (PAXIL) 20 MG tablet; Take 1 tablet (20 mg total) by mouth daily.  Dispense: 90 tablet; Refill: 3   4. Allergic rhinitis, unspecified allergic rhinitis type Stable. Continue current treatment. - cetirizine (ZYRTEC) 10 MG tablet; Take 1 tablet (10 mg total) by mouth daily.  Dispense: 90 tablet; Refill: 3  5. Need for prophylactic vaccination and inoculation against influenza - Flu Vaccine QUAD 36+ mos IM  6. Screening for colon cancer - Ambulatory referral to Gastroenterology  7. History of colonic polyps - Ambulatory referral to Gastroenterology  8. Screening for hyperlipidemia - Lipid panel; Future  9. Screening for deficiency anemia - CBC with Differential/Platelet; Future  10. Screening for thyroid disorder - TSH; Future  11. Screening for HIV (human immunodeficiency virus) -  HIV antibody; Future  12. Need for hepatitis C screening test - Hepatitis C antibody; Future  13. Screening for metabolic disorder - Comprehensive metabolic panel; Future  She needs fasting labs, so future orders placed today. [name] will bring her back for a lab only visit when she is fasting in the next 1-2 months.   Fernande Bras, PA-C Physician Assistant-Certified [facility]

## 2015-11-24 NOTE — Telephone Encounter (Signed)
error 

## 2016-01-07 DIAGNOSIS — Z5181 Encounter for therapeutic drug level monitoring: Secondary | ICD-10-CM | POA: Diagnosis not present

## 2016-01-07 DIAGNOSIS — Z79899 Other long term (current) drug therapy: Secondary | ICD-10-CM | POA: Diagnosis not present

## 2016-01-20 ENCOUNTER — Ambulatory Visit (AMBULATORY_SURGERY_CENTER): Payer: Self-pay

## 2016-01-20 VITALS — Ht 59.0 in | Wt 143.0 lb

## 2016-01-20 DIAGNOSIS — Z86010 Personal history of colonic polyps: Secondary | ICD-10-CM

## 2016-01-20 MED ORDER — NA SULFATE-K SULFATE-MG SULF 17.5-3.13-1.6 GM/177ML PO SOLN: 1.0000 | Freq: Once | ORAL | Status: DC

## 2016-01-20 NOTE — Progress Notes (Signed)
No egg or soy allergies Not on home 02 No previous anesthesia complications Emmi video emailed to home email address. No diet or weight loss meds 

## 2016-01-27 ENCOUNTER — Encounter: Payer: Self-pay | Admitting: Gastroenterology

## 2016-01-27 ENCOUNTER — Ambulatory Visit (AMBULATORY_SURGERY_CENTER): Payer: Medicare Other | Admitting: Gastroenterology

## 2016-01-27 VITALS — BP 106/77 | HR 88 | Temp 97.6°F | Resp 16 | Ht 59.0 in | Wt 143.0 lb

## 2016-01-27 DIAGNOSIS — Z86010 Personal history of colonic polyps: Secondary | ICD-10-CM

## 2016-01-27 DIAGNOSIS — D120 Benign neoplasm of cecum: Secondary | ICD-10-CM

## 2016-01-27 MED ORDER — SODIUM CHLORIDE 0.9 % IV SOLN: 500.0000 mL | INTRAVENOUS | Status: DC

## 2016-01-27 NOTE — Progress Notes (Signed)
Report to PACU, RN, vss, BBS= Clear.  

## 2016-01-27 NOTE — Progress Notes (Signed)
Called to room to assist during endoscopic procedure.  Patient ID and intended procedure confirmed with present staff. Received instructions for my participation in the procedure from the performing physician.  

## 2016-01-27 NOTE — Op Note (Signed)
[facility]  [address] [place], [id number]   COLONOSCOPY PROCEDURE REPORT  PATIENT: Heather, [name]  MR#: [id number] BIRTHDATE: [birth year] , 54  yrs. old GENDER: female ENDOSCOPIST: Ladene Artist, MD, [facility] PROCEDURE DATE:  01/27/2016 PROCEDURE:   Colonoscopy, surveillance and Colonoscopy with snare polypectomy First Screening Colonoscopy - Avg.  risk and is 50 yrs.  old or older - No.  Prior Negative Screening - Now for repeat screening. N/A  History of Adenoma - Now for follow-up colonoscopy & has been > or = to 3 yrs.  No.  It has been less than 3 yrs since last colonoscopy.  Medical reason.  Polyps removed today? Yes ASA CLASS:   Class II INDICATIONS:Surveillance due to prior colonic neoplasia and Advanced Neoplasm (= 10 mm, high grade dysplasia, villous component. MEDICATIONS: Monitored anesthesia care and Propofol 300 mg IV DESCRIPTION OF PROCEDURE:   After the risks benefits and alternatives of the procedure were thoroughly explained, informed consent was obtained.  The digital rectal exam revealed no abnormalities of the rectum.   The LB [id number]  endoscope was introduced through the anus and advanced to the cecum, which was identified by both the appendix and ileocecal valve. No adverse events experienced.   The quality of the prep was good.  (Suprep was used)  The instrument was then slowly withdrawn as the colon was fully examined. Estimated blood loss is zero unless otherwise noted in this procedure report.    COLON FINDINGS: A semi-pedunculated polyp measuring 8 mm in size was found at the cecum.  A polypectomy was performed with a cold snare. The resection was complete, the polyp tissue was completely retrieved and sent to histology.   There was moderate diverticulosis noted in the sigmoid colon and descending colon with associated colonic spasm, luminal narrowing and muscular hypertrophy.   Prior polyectomy site and tattoo in  rectum.   The examination was otherwise normal.  Retroflexed views revealed internal Grade I hemorrhoids. The time to cecum = 3.2 Withdrawal time = 7.8   The scope was withdrawn and the procedure completed. COMPLICATIONS: There were no immediate complications.  ENDOSCOPIC IMPRESSION: 1.   Semi-pedunculated polyp at the cecum; polypectomy performed with a cold snare 2.   Moderate diverticulosis in the sigmoid colon and descending colon 3.   Prior polypectomy site and tattoo in rectum 4.   Grade l internal hemorrhoids  RECOMMENDATIONS: 1.  Await pathology results 2.  High fiber diet with liberal fluid intake. 3.  Repeat Colonoscopy in 3 years.  eSigned:  Ladene Artist, MD, [facility] 01/27/2016 11:19 AM  [C

## 2016-01-27 NOTE — Patient Instructions (Signed)
YOU HAD AN ENDOSCOPIC PROCEDURE TODAY AT [facility]:   Refer to the procedure report that was given to you for any specific questions about what was found during the examination.  If the procedure report does not answer your questions, please call your gastroenterologist to clarify.  If you requested that your care partner not be given the details of your procedure findings, then the procedure report has been included in a sealed envelope for you to review at your convenience later.  YOU SHOULD EXPECT: Some feelings of bloating in the abdomen. Passage of more gas than usual.  Walking can help get rid of the air that was put into your GI tract during the procedure and reduce the bloating. If you had a lower endoscopy (such as a colonoscopy or flexible sigmoidoscopy) you may notice spotting of blood in your stool or on the toilet paper. If you underwent a bowel prep for your procedure, you may not have a normal bowel movement for a few days.  Please Note:  You might notice some irritation and congestion in your nose or some drainage.  This is from the oxygen used during your procedure.  There is no need for concern and it should clear up in a day or so.  SYMPTOMS TO REPORT IMMEDIATELY:   Following lower endoscopy (colonoscopy or flexible sigmoidoscopy):  Excessive amounts of blood in the stool  Significant tenderness or worsening of abdominal pains  Swelling of the abdomen that is new, acute  Fever of 100F or higher  For urgent or emergent issues, a gastroenterologist can be reached at any hour by calling [phone].   DIET: Your first meal following the procedure should be a small meal and then it is ok to progress to your normal diet. Heavy or fried foods are harder to digest and may make you feel nauseous or bloated.  Likewise, meals heavy in dairy and vegetables can increase bloating.  Drink plenty of fluids but you should avoid alcoholic beverages for 24  hours.  ACTIVITY:  You should plan to take it easy for the rest of today and you should NOT DRIVE or use heavy machinery until tomorrow (because of the sedation medicines used during the test).    FOLLOW UP: Our staff will call the number listed on your records the next business day following your procedure to check on you and address any questions or concerns that you may have regarding the information given to you following your procedure. If we do not reach you, we will leave a message.  However, if you are feeling well and you are not experiencing any problems, there is no need to return our call.  We will assume that you have returned to your regular daily activities without incident.  If any biopsies were taken you will be contacted by phone or by letter within the next 1-3 weeks.  Please call us at [phone] if you have not heard about the biopsies in 3 weeks.    SIGNATURES/CONFIDENTIALITY: You and/or your care partner have signed paperwork which will be entered into your electronic medical record.  These signatures attest to the fact that that the information above on your After Visit Summary has been reviewed and is understood.  Full responsibility of the confidentiality of this discharge information lies with you and/or your care-partner.  Please review polyp, diverticulosis, high fiber diet, and hemorrhoid handouts provided. Await pathology results. Next colonoscopy in 3 years.

## 2016-01-28 ENCOUNTER — Telehealth: Payer: Self-pay | Admitting: *Deleted

## 2016-01-28 NOTE — Telephone Encounter (Signed)
  Follow up Call-  Call back number 01/27/2016  Post procedure Call Back phone  # [phone]  Permission to leave phone message Yes     Patient questions:  Do you have a fever, pain , or abdominal swelling? No. Pain Score  0 *  Have you tolerated food without any problems? Yes.    Have you been able to return to your normal activities? Yes.    Do you have any questions about your discharge instructions: Diet   No. Medications  No. Follow up visit  No.  Do you have questions or concerns about your Care? No.  Actions: * If pain score is 4 or above: No action needed, pain <4.  Spoke with [name],pt's caretaker ,who said pt is still asleep this morning but that she hadn't had any problems after procedure.

## 2016-02-07 ENCOUNTER — Encounter: Payer: Self-pay | Admitting: Gastroenterology

## 2016-02-25 DIAGNOSIS — Z79899 Other long term (current) drug therapy: Secondary | ICD-10-CM | POA: Diagnosis not present

## 2016-02-25 DIAGNOSIS — Z5181 Encounter for therapeutic drug level monitoring: Secondary | ICD-10-CM | POA: Diagnosis not present

## 2016-04-28 ENCOUNTER — Telehealth: Payer: Self-pay

## 2016-04-28 NOTE — Telephone Encounter (Signed)
Called and Ms. Heather [name] was gone for the day.

## 2016-04-28 NOTE — Telephone Encounter (Signed)
Heather [name] patients care giver is needing to talk with someone about patient medications  Best number [phone]

## 2016-05-01 NOTE — Telephone Encounter (Signed)
Paxil and Xanax was prescribed  Spoke with pt's caregiver and states Heather [name] came off her medication suddenly. She feels she does not want to be doped up all of the time. Heather [name], Dr. Martie Round just took her off her medications without consulting with Ms. Heather [name] first since pt is intellectually disabled.She end up having a meltdown Saturday and she was crying hysterically. Heather [name] wants to see if Heather [name] can take over her care. I advised Heather [name] to bring pt in to be seen if she wants Heather [name] to write her medications for now on. Heather [name] agreed.

## 2016-05-01 NOTE — Telephone Encounter (Signed)
Can we see if Medicare will pay for a matress topper.

## 2016-05-18 DIAGNOSIS — Z5181 Encounter for therapeutic drug level monitoring: Secondary | ICD-10-CM | POA: Diagnosis not present

## 2016-05-18 DIAGNOSIS — Z79899 Other long term (current) drug therapy: Secondary | ICD-10-CM | POA: Diagnosis not present

## 2016-05-30 ENCOUNTER — Encounter: Payer: Self-pay | Admitting: Physician Assistant

## 2016-05-30 ENCOUNTER — Telehealth: Payer: Self-pay

## 2016-05-30 NOTE — Telephone Encounter (Signed)
[facility] sent [place] a fax for medical records a month ago. I advised to re fax. Please look out for fax. Their phone: [phone]

## 2016-06-15 ENCOUNTER — Ambulatory Visit (INDEPENDENT_AMBULATORY_CARE_PROVIDER_SITE_OTHER): Payer: Medicare Other | Admitting: Physician Assistant

## 2016-06-15 ENCOUNTER — Ambulatory Visit (INDEPENDENT_AMBULATORY_CARE_PROVIDER_SITE_OTHER): Payer: Medicare Other

## 2016-06-15 VITALS — BP 108/68 | HR 75 | Temp 99.0°F | Resp 18 | Ht 59.0 in | Wt 149.0 lb

## 2016-06-15 DIAGNOSIS — Z130 Encounter for screening for diseases of the blood and blood-forming organs and certain disorders involving the immune mechanism: Secondary | ICD-10-CM

## 2016-06-15 DIAGNOSIS — Z6830 Body mass index (BMI) 30.0-30.9, adult: Secondary | ICD-10-CM

## 2016-06-15 DIAGNOSIS — M5441 Lumbago with sciatica, right side: Secondary | ICD-10-CM

## 2016-06-15 DIAGNOSIS — M419 Scoliosis, unspecified: Secondary | ICD-10-CM | POA: Insufficient documentation

## 2016-06-15 DIAGNOSIS — Z114 Encounter for screening for human immunodeficiency virus [HIV]: Secondary | ICD-10-CM | POA: Diagnosis not present

## 2016-06-15 DIAGNOSIS — Z79899 Other long term (current) drug therapy: Secondary | ICD-10-CM | POA: Diagnosis not present

## 2016-06-15 DIAGNOSIS — Z1159 Encounter for screening for other viral diseases: Secondary | ICD-10-CM | POA: Diagnosis not present

## 2016-06-15 DIAGNOSIS — Z13220 Encounter for screening for lipoid disorders: Secondary | ICD-10-CM | POA: Diagnosis not present

## 2016-06-15 DIAGNOSIS — M2460 Ankylosis, unspecified joint: Secondary | ICD-10-CM

## 2016-06-15 DIAGNOSIS — Z1329 Encounter for screening for other suspected endocrine disorder: Secondary | ICD-10-CM | POA: Diagnosis not present

## 2016-06-15 DIAGNOSIS — Z23 Encounter for immunization: Secondary | ICD-10-CM

## 2016-06-15 DIAGNOSIS — M79604 Pain in right leg: Secondary | ICD-10-CM | POA: Diagnosis not present

## 2016-06-15 DIAGNOSIS — M47816 Spondylosis without myelopathy or radiculopathy, lumbar region: Secondary | ICD-10-CM | POA: Diagnosis not present

## 2016-06-15 MED ORDER — MELOXICAM 7.5 MG PO TABS: 7.5000 mg | ORAL_TABLET | Freq: Every day | ORAL | Status: DC

## 2016-06-15 NOTE — Patient Instructions (Addendum)
     IF you received an x-ray today, you will receive an invoice from [facility]. Please contact [facility] at [phone] with questions or concerns regarding your invoice.   IF you received labwork today, you will receive an invoice from [employer]. Please contact [facility] at [phone] with questions or concerns regarding your invoice.   Our billing staff will not be able to assist you with questions regarding bills from these companies.  You will be contacted with the lab results as soon as they are available. The fastest way to get your results is to activate your My Chart account. Instructions are located on the last page of this paperwork. If you have not heard from [place] regarding the results in 2 weeks, please contact this office.     Dg Lumbar Spine Complete  06/15/2016  CLINICAL DATA:  Patient with low back pain radiating to the right leg. EXAM: LUMBAR SPINE - COMPLETE 4+ VIEW COMPARISON:  Abdominal radiograph 04/14/2015. FINDINGS: Leftward curvature of the thoracolumbar spine. Multilevel degenerative disc disease. Bulky anterior osteophytosis. L4-5 and L5-S1 facet degenerative changes. No acute osseous abnormality. Small calcific density within the right hemi abdomen. IMPRESSION: Lumbar spine degenerative changes. No acute osseous abnormality. Right hemi abdomen calcific density possibly representing a small renal stone. Electronically Signed   By: Lovey Newcomer M.D.   On: 06/15/2016 18:19

## 2016-06-15 NOTE — Progress Notes (Signed)
Patient ID: Heather [name], female    DOB: October 11, 1961, 55 y.o.   MRN: [id number]  PCP: Porfirio Oar, PA-C  Subjective:   Chief Complaint  Patient presents with  . Hip Pain    RIGHT  . Leg Pain    HPI Presents for evaluation of RIGHT hip/leg pain "recently." She is accompanied by her guardian, [name].  The respite caregiver notified [name] last month that Heather [name] was complaining of hip and leg pain on the RIGHT. Heather [name] says that the worst pain is in her low back. Pain is worse with walking around ("cramping"). Worse when it rains. Worse with activities likes vacuuming, taking out the trash. The pain is located around the knee, but she is not sure if the pain is in the muscles or "deep in the bone." Elevation, cold pack, acetaminophen but it's unclear if they helped. Minimizing the activities that aggravate it, is helpful, but she works at [employer] and often the work is pushing, pulling, lifting, carrying. No loss of bowel/bowel control, saddle anesthesia. No numbness/tingling. She uses assistive devices to ambulate outside of her home-a rolling walker named Susie and a cane named Fred.  "We need [facility] contacted." [name] doesn't have any of Heather [name]'s old medical records, and Heather [name] doesn't remember the details of the knee fusion she had in the 1990's. She apparently had an injury to the knee. A doctor from [place] came to do the surgery. Reportedly there is a steel rod that extends from the hip to the ankle. Wears a brace on the LEFT leg, and has pain there too. Scoliosis  Had hand tremors with previous antidepressant, so was switched to citalopram and uses cogentin. [name] notes that her response time is a little bit longer in conversations and movements, but that it's not a problem.      Review of Systems As above.    Patient Active Problem List   Diagnosis Date Noted  . Arthritis of knee 09/17/2014  . Arthritis 08/26/2014  . Anxiety state, unspecified  08/26/2014  . Migraine 08/26/2014  . Mental retardation 08/26/2014     Prior to Admission medications   Medication Sig Start Date End Date Taking? Authorizing Provider  ALPRAZolam (XANAX) 0.25 MG tablet Take 1 tablet (0.25 mg total) by mouth 2 (two) times daily as needed for anxiety. 11/23/15  Yes Kaedan Richert, PA-C  benztropine (COGENTIN) 0.5 MG tablet Take 0.5 mg by mouth 2 (two) times daily.   Yes Historical Provider, MD  cetirizine (ZYRTEC) 10 MG tablet Take 1 tablet (10 mg total) by mouth daily. 11/23/15  Yes Ledonna Dormer, PA-C  cholecalciferol (VITAMIN D) 400 units TABS tablet Take 2,000 Units by mouth.   Yes Historical Provider, MD  citalopram (CELEXA) 40 MG tablet Take 40 mg by mouth daily.   Yes Historical Provider, MD  clobetasol ointment (TEMOVATE) 0.05 % Apply 1 application topically 2 (two) times daily. Apply bid to itchy rash. Do not use on face or genitals or axillae Patient not taking: Reported on 01/27/2016 08/22/14   Ashley Jacobs Guest, MD  mometasone (NASONEX) 50 MCG/ACT nasal spray Place 2 sprays into the nose daily. Patient not taking: Reported on 11/23/2015 11/01/14   Shade Flood, MD  PARoxetine (PAXIL) 20 MG tablet Take 1 tablet (20 mg total) by mouth daily. Patient not taking: Reported on 06/15/2016 11/23/15   Porfirio Oar, PA-C  polyethylene glycol powder (GLYCOLAX/MIRALAX) powder Take 17 g by mouth 3 (three) times daily. Patient not taking: Reported on 11/23/2015 04/14/15  Collie Siad English, PA     No Known Allergies     Objective:  Physical Exam  Constitutional: She is oriented to person, place, and time. She appears well-developed and well-nourished. She is active and cooperative. No distress.  BP 108/68 mmHg  Pulse 75  Temp(Src) 99 F (37.2 C) (Oral)  Resp 18  Ht 4\' 11"  (1.499 m)  Wt 149 lb (67.586 kg)  BMI 30.08 kg/m2  SpO2 95%  HENT:  Head: Normocephalic and atraumatic.  Right Ear: Hearing normal.  Left Ear: Hearing normal.  Eyes:  Conjunctivae are normal. No scleral icterus.  Neck: Normal range of motion. Neck supple. No thyromegaly present.  Cardiovascular: Normal rate, regular rhythm and normal heart sounds.   Pulses:      Radial pulses are 2+ on the right side, and 2+ on the left side.  Pulmonary/Chest: Effort normal and breath sounds normal.  Musculoskeletal:       Right hip: She exhibits decreased range of motion and tenderness. She exhibits no bony tenderness.       Thoracic back: Normal.       Lumbar back: She exhibits tenderness, bony tenderness and pain. She exhibits normal range of motion, no swelling, no edema, no deformity, no laceration, no spasm and normal pulse.       Right upper leg: She exhibits tenderness. She exhibits no bony tenderness and no edema.       Right lower leg: She exhibits tenderness.  The RIGHT leg is fused at the knee and is diffusely tender, though mildly.   Lymphadenopathy:       Head (right side): No tonsillar, no preauricular, no posterior auricular and no occipital adenopathy present.       Head (left side): No tonsillar, no preauricular, no posterior auricular and no occipital adenopathy present.    She has no cervical adenopathy.       Right: No supraclavicular adenopathy present.       Left: No supraclavicular adenopathy present.  Neurological: She is alert and oriented to person, place, and time. She has normal strength. No sensory deficit.  Skin: Skin is warm, dry and intact. No rash noted. No cyanosis or erythema. Nails show no clubbing.  Psychiatric: She has a normal mood and affect. Her speech is normal and behavior is normal.       Results for orders placed or performed in visit on 04/14/15  POCT CBC  Result Value Ref Range   WBC 3.7 (A) 4.6 - 10.2 K/uL   Lymph, poc 1.6 0.6 - 3.4   POC LYMPH PERCENT 44.3 10 - 50 %L   MID (cbc) 0.2 0 - 0.9   POC MID % 5.9 0 - 12 %M   POC Granulocyte 1.8 (A) 2 - 6.9   Granulocyte percent 49.8 37 - 80 %G   RBC 4.12 4.04 - 5.48  M/uL   Hemoglobin 12.7 12.2 - 16.2 g/dL   HCT, POC 40.9 81.1 - 47.9 %   MCV 94.6 80 - 97 fL   MCH, POC 30.9 27 - 31.2 pg   MCHC 32.7 31.8 - 35.4 g/dL   RDW, POC 91.4 %   Platelet Count, POC 134 (A) 142 - 424 K/uL   MPV 9.9 0 - 99.8 fL  POCT UA - Microscopic Only  Result Value Ref Range   WBC, Ur, HPF, POC 0-2    RBC, urine, microscopic neg    Bacteria, U Microscopic trace    Mucus, UA neg  Epithelial cells, urine per micros 0-2    Crystals, Ur, HPF, POC neg    Casts, Ur, LPF, POC neg    Yeast, UA neg   POCT urinalysis dipstick  Result Value Ref Range   Color, UA yellow    Clarity, UA clear    Glucose, UA neg    Bilirubin, UA neg    Ketones, UA neg    Spec Grav, UA 1.020    Blood, UA neg    pH, UA 5.5    Protein, UA neg    Urobilinogen, UA 0.2    Nitrite, UA neg    Leukocytes, UA Trace      Dg Lumbar Spine Complete  06/15/2016  CLINICAL DATA:  Patient with low back pain radiating to the right leg. EXAM: LUMBAR SPINE - COMPLETE 4+ VIEW COMPARISON:  Abdominal radiograph 04/14/2015. FINDINGS: Leftward curvature of the thoracolumbar spine. Multilevel degenerative disc disease. Bulky anterior osteophytosis. L4-5 and L5-S1 facet degenerative changes. No acute osseous abnormality. Small calcific density within the right hemi abdomen. IMPRESSION: Lumbar spine degenerative changes. No acute osseous abnormality. Right hemi abdomen calcific density possibly representing a small renal stone. Electronically Signed   By: Annia Belt M.D.   On: 06/15/2016 18:19       Assessment & Plan:   1. Leg pain, right 2. RIGHT knee fusion 3. Bilateral low back pain with right-sided sciatica Scoliosis. Multilevel degenerative changes of the lumbar spine. Records from [facility] requested. The information is not in Care Everywhere. Trial of meloxicam. Refer to Dr. Dion Saucier to help [place] determine if it is an option to allow for flexion of the knee. We hope to allow her to be active and mobile as long  as possible. She is motivated to do exercises and would participate in PT if that were recommended. - DG Lumbar Spine Complete; Future - meloxicam (MOBIC) 7.5 MG tablet; Take 1 tablet (7.5 mg total) by mouth daily.  Dispense: 30 tablet; Refill: 0 - Ambulatory referral to Orthopedic Surgery  4. Need for hepatitis C screening test - Hepatitis C antibody  5. Screening for HIV (human immunodeficiency virus) - HIV antibody  6. Screening for deficiency anemia - CBC with Differential/Platelet  7. Screening for hyperlipidemia - Lipid panel  8. Screening for thyroid disorder - TSH  9. BMI 30.0-30.9,adult  10. Need for Tdap vaccination - Tdap vaccine greater than or equal to 7yo IM   Fernande Bras, PA-C Physician Assistant-Certified [facility]

## 2016-06-16 LAB — CBC WITH DIFFERENTIAL/PLATELET
Basophils Absolute: 0 cells/uL (ref 0–200)
Basophils Relative: 0 %
Eosinophils Absolute: 112 cells/uL (ref 15–500)
Eosinophils Relative: 2 %
HCT: 38.5 % (ref 35.0–45.0)
Hemoglobin: 12.8 g/dL (ref 11.7–15.5)
Lymphocytes Relative: 33 %
Lymphs Abs: 1848 cells/uL (ref 850–3900)
MCH: 31.4 pg (ref 27.0–33.0)
MCHC: 33.2 g/dL (ref 32.0–36.0)
MCV: 94.4 fL (ref 80.0–100.0)
MPV: 12.5 fL (ref 7.5–12.5)
Monocytes Absolute: 280 cells/uL (ref 200–950)
Monocytes Relative: 5 %
Neutro Abs: 3360 cells/uL (ref 1500–7800)
Neutrophils Relative %: 60 %
Platelets: 154 10*3/uL (ref 140–400)
RBC: 4.08 MIL/uL (ref 3.80–5.10)
RDW: 13.5 % (ref 11.0–15.0)
WBC: 5.6 10*3/uL (ref 3.8–10.8)

## 2016-06-16 LAB — HEPATITIS C ANTIBODY: HCV Ab: NEGATIVE

## 2016-06-16 LAB — LIPID PANEL
Cholesterol: 225 mg/dL — ABNORMAL HIGH (ref 125–200)
HDL: 80 mg/dL (ref >=46)
LDL Cholesterol: 129 mg/dL (ref <130)
Total CHOL/HDL Ratio: 2.8 Ratio (ref <=5.0)
Triglycerides: 81 mg/dL (ref <150)
VLDL: 16 mg/dL (ref <30)

## 2016-06-16 LAB — HIV ANTIBODY (ROUTINE TESTING W REFLEX): HIV 1&2 Ab, 4th Generation: NONREACTIVE

## 2016-06-16 LAB — TSH: TSH: 2.05 mIU/L

## 2016-06-19 ENCOUNTER — Encounter: Payer: Self-pay | Admitting: Physician Assistant

## 2016-06-21 DIAGNOSIS — Z79899 Other long term (current) drug therapy: Secondary | ICD-10-CM | POA: Diagnosis not present

## 2016-06-21 DIAGNOSIS — Z5181 Encounter for therapeutic drug level monitoring: Secondary | ICD-10-CM | POA: Diagnosis not present

## 2016-06-22 ENCOUNTER — Encounter: Payer: Self-pay | Admitting: Physician Assistant

## 2016-06-22 ENCOUNTER — Telehealth: Payer: Self-pay | Admitting: Physician Assistant

## 2016-06-22 DIAGNOSIS — F79 Unspecified intellectual disabilities: Secondary | ICD-10-CM

## 2016-06-22 DIAGNOSIS — M2460 Ankylosis, unspecified joint: Secondary | ICD-10-CM

## 2016-06-22 NOTE — Telephone Encounter (Signed)
Please cal this patient's guardian, [name].  I have received the records from [facility]. At her last visit there, more than 10 years ago, the Psychologist, sport and exercise and her mother and [name] elected against a total knee replacement. The hip pain was thought due to bursitis and she was to have PT and then follow-up in 6 months.  I don't see that she followed up, and I don't know if she had PT, and if so, if it helped.  I am happy to refer her to orthopedics here (I would recommend Dr. Carter Kitten), or we can start with PT. Let me know how they would like to proceed.

## 2016-06-28 DIAGNOSIS — M5431 Sciatica, right side: Secondary | ICD-10-CM | POA: Diagnosis not present

## 2016-06-28 DIAGNOSIS — M25461 Effusion, right knee: Secondary | ICD-10-CM | POA: Diagnosis not present

## 2016-06-30 NOTE — Telephone Encounter (Signed)
Did anyone contact this patient's guardian, [name]?  If not, please do.  If so, please document.

## 2016-07-04 NOTE — Telephone Encounter (Signed)
WONDERFUL! THank you!

## 2016-07-04 NOTE — Telephone Encounter (Signed)
A referral was sent to [facility] for Dr Mardelle Matte at the end of June.  The patient was seen on 06/28/16 and has a follow up scheduled for 07/26/16 with Dr Mardelle Matte.

## 2016-07-14 ENCOUNTER — Telehealth: Payer: Self-pay

## 2016-07-14 DIAGNOSIS — M5441 Lumbago with sciatica, right side: Secondary | ICD-10-CM

## 2016-07-14 MED ORDER — MELOXICAM 7.5 MG PO TABS: 7.5000 mg | ORAL_TABLET | Freq: Every day | ORAL | Status: DC

## 2016-07-14 NOTE — Telephone Encounter (Signed)
Meds ordered this encounter  Medications  . meloxicam (MOBIC) 7.5 MG tablet    Sig: Take 1 tablet (7.5 mg total) by mouth daily.    Dispense:  30 tablet    Refill:  5    Ok to dispense #90, RF x 1 if preferred    Order Specific Question:  Supervising Provider    Answer:  Brigitte Pulse, EVA N [[id number]]

## 2016-07-14 NOTE — Telephone Encounter (Signed)
fax req from [facility] for Meloxicam 7.5mg  #30 RF-0- - sent to [facility]

## 2016-07-18 ENCOUNTER — Encounter: Payer: Self-pay | Admitting: Physician Assistant

## 2016-08-23 DIAGNOSIS — Z79899 Other long term (current) drug therapy: Secondary | ICD-10-CM | POA: Diagnosis not present

## 2016-08-23 DIAGNOSIS — Z5181 Encounter for therapeutic drug level monitoring: Secondary | ICD-10-CM | POA: Diagnosis not present

## 2016-10-13 ENCOUNTER — Telehealth: Payer: Self-pay | Admitting: Physician Assistant

## 2016-10-13 NOTE — Telephone Encounter (Signed)
Form completed. Placed in box for faxing in provider lounge. Please scan after faxing.

## 2016-10-18 DIAGNOSIS — Z5181 Encounter for therapeutic drug level monitoring: Secondary | ICD-10-CM | POA: Diagnosis not present

## 2016-10-18 DIAGNOSIS — Z79899 Other long term (current) drug therapy: Secondary | ICD-10-CM | POA: Diagnosis not present

## 2016-11-10 ENCOUNTER — Encounter: Payer: Self-pay | Admitting: Physician Assistant

## 2016-11-10 DIAGNOSIS — M8589 Other specified disorders of bone density and structure, multiple sites: Secondary | ICD-10-CM | POA: Diagnosis not present

## 2016-11-10 DIAGNOSIS — Z1231 Encounter for screening mammogram for malignant neoplasm of breast: Secondary | ICD-10-CM | POA: Diagnosis not present

## 2016-11-10 LAB — HM MAMMOGRAPHY

## 2016-11-14 DIAGNOSIS — Z5181 Encounter for therapeutic drug level monitoring: Secondary | ICD-10-CM | POA: Diagnosis not present

## 2016-11-14 DIAGNOSIS — Z79899 Other long term (current) drug therapy: Secondary | ICD-10-CM | POA: Diagnosis not present

## 2016-12-02 ENCOUNTER — Telehealth: Payer: Self-pay | Admitting: Physician Assistant

## 2016-12-02 ENCOUNTER — Encounter: Payer: Self-pay | Admitting: Physician Assistant

## 2016-12-02 DIAGNOSIS — M8580 Other specified disorders of bone density and structure, unspecified site: Secondary | ICD-10-CM | POA: Insufficient documentation

## 2016-12-02 NOTE — Telephone Encounter (Signed)
Please advise patient that her bone density has worsened compared to 2 years ago. She needs to be taking Vitamin D 2000 IU daily, AND Calcium 1500 mg daily.

## 2016-12-07 NOTE — Telephone Encounter (Signed)
Informed pt and caregiver of your message

## 2017-01-01 ENCOUNTER — Telehealth: Payer: Self-pay

## 2017-01-01 DIAGNOSIS — F419 Anxiety disorder, unspecified: Secondary | ICD-10-CM

## 2017-01-01 NOTE — Telephone Encounter (Signed)
fyi

## 2017-01-01 NOTE — Telephone Encounter (Signed)
Pt is having a hard time sleeping and just found out a her father died and is very up her care taker beverly is calling to see if chelle could prescribe her something to help with this beverly would like chelle to give her a call   Please advise [phone]

## 2017-01-02 MED ORDER — ALPRAZOLAM 0.25 MG PO TABS: 0.2500 mg | ORAL_TABLET | Freq: Two times a day (BID) | ORAL | 0 refills | Status: DC | PRN

## 2017-01-02 NOTE — Telephone Encounter (Signed)
[facility] [address]

## 2017-01-02 NOTE — Telephone Encounter (Signed)
I am so sorry to hear that Heather [name]'s father has died. She can increase the alprazolam to 2 tablets BID for a few days to help. Continue citalopram. If her grief symptoms continue, come in to see me.

## 2017-01-02 NOTE — Telephone Encounter (Signed)
Caregiver beverley states she is on wellbutrin and using alprazolam, would like some extra alprazolam [facility] [address] please. Will see you as needed

## 2017-01-02 NOTE — Telephone Encounter (Signed)
Rx printed at 104. Will bring to 102 after clinic, or OK to call in.  Meds ordered this encounter  Medications  . ALPRAZolam (XANAX) 0.25 MG tablet    Sig: Take 1-2 tablets (0.25-0.5 mg total) by mouth 2 (two) times daily as needed for anxiety.    Dispense:  60 tablet    Refill:  0    Order Specific Question:   Supervising Provider    Answer:   Brigitte Pulse, EVA N [[id number]]

## 2017-01-17 DIAGNOSIS — Z79899 Other long term (current) drug therapy: Secondary | ICD-10-CM | POA: Diagnosis not present

## 2017-01-17 DIAGNOSIS — Z5181 Encounter for therapeutic drug level monitoring: Secondary | ICD-10-CM | POA: Diagnosis not present

## 2017-01-22 ENCOUNTER — Other Ambulatory Visit: Payer: Self-pay | Admitting: Physician Assistant

## 2017-01-22 DIAGNOSIS — J309 Allergic rhinitis, unspecified: Secondary | ICD-10-CM

## 2017-01-22 NOTE — Telephone Encounter (Signed)
Meds ordered this encounter  Medications  . cetirizine (ZYRTEC) 10 MG tablet    Sig: TAKE 1 TABLET (10 MG TOTAL) BY MOUTH DAILY.    Dispense:  90 tablet    Refill:  3

## 2017-02-10 ENCOUNTER — Ambulatory Visit (INDEPENDENT_AMBULATORY_CARE_PROVIDER_SITE_OTHER): Payer: Medicare Other | Admitting: Family Medicine

## 2017-02-10 ENCOUNTER — Ambulatory Visit (INDEPENDENT_AMBULATORY_CARE_PROVIDER_SITE_OTHER): Payer: Medicare Other

## 2017-02-10 VITALS — BP 98/60 | HR 76 | Temp 98.3°F | Resp 16 | Ht 59.0 in | Wt 145.0 lb

## 2017-02-10 DIAGNOSIS — M25512 Pain in left shoulder: Secondary | ICD-10-CM | POA: Diagnosis not present

## 2017-02-10 DIAGNOSIS — K5909 Other constipation: Secondary | ICD-10-CM | POA: Diagnosis not present

## 2017-02-10 DIAGNOSIS — S46012A Strain of muscle(s) and tendon(s) of the rotator cuff of left shoulder, initial encounter: Secondary | ICD-10-CM

## 2017-02-10 DIAGNOSIS — M4134 Thoracogenic scoliosis, thoracic region: Secondary | ICD-10-CM

## 2017-02-10 DIAGNOSIS — M7552 Bursitis of left shoulder: Secondary | ICD-10-CM

## 2017-02-10 DIAGNOSIS — M7502 Adhesive capsulitis of left shoulder: Secondary | ICD-10-CM | POA: Diagnosis not present

## 2017-02-10 MED ORDER — POLYETHYLENE GLYCOL 3350 17 GM/SCOOP PO POWD: 0.5000 g | Freq: Every day | ORAL | Status: DC

## 2017-02-10 MED ORDER — MELOXICAM 7.5 MG PO TABS: 7.5000 mg | ORAL_TABLET | Freq: Every day | ORAL | 0 refills | Status: DC

## 2017-02-10 NOTE — Patient Instructions (Addendum)
If you continue to have any problems, make an appointment with Dr. Carlota Raspberry or any of our sports medicine fellows for further evaluation.  I suspect you may get some benefit from a cortisone injection if the oral medication is not helping.  Ice shoulder for 10 minutes 3-4 times a day and try to keep moving it and stretching but no lifting.   IF you received an x-ray today, you will receive an invoice from [facility]. Please contact [facility] at [phone] with questions or concerns regarding your invoice.   IF you received labwork today, you will receive an invoice from [place]. Please contact [facility] at [phone] with questions or concerns regarding your invoice.   Our billing staff will not be able to assist you with questions regarding bills from these companies.  You will be contacted with the lab results as soon as they are available. The fastest way to get your results is to activate your My Chart account. Instructions are located on the last page of this paperwork. If you have not heard from [place] regarding the results in 2 weeks, please contact this office.    Bursitis Introduction Bursitis is inflammation and irritation of a bursa, which is one of the small, fluid-filled sacs that cushion and protect the moving parts of your body. These sacs are located between bones and muscles, muscle attachments, or skin areas next to bones. A bursa protects these structures from the wear and tear that results from frequent movement. An inflamed bursa causes pain and swelling. Fluid may build up inside the sac. Bursitis is most common near joints, especially the knees, elbows, hips, and shoulders. What are the causes? Bursitis can be caused by:  Injury from:  A direct blow, like falling on your knee or elbow.  Overuse of a joint (repetitive stress).  Infection. This can happen if bacteria gets into a bursa through a cut or scrape near a joint.  Diseases that cause joint  inflammation, such as gout and rheumatoid arthritis. What increases the risk? You may be at risk for bursitis if you:  Have a job or hobby that involves a lot of repetitive stress on your joints.  Have a condition that weakens your body's defense system (immune system), such as diabetes, cancer, or HIV.  Lift and reach overhead often.  Kneel or lean on hard surfaces often.  Run or walk often. What are the signs or symptoms? The most common signs and symptoms of bursitis are:  Pain that gets worse when you move the affected body part or put weight on it.  Inflammation.  Stiffness. Other signs and symptoms may include:  Redness.  Tenderness.  Warmth.  Pain that continues after rest.  Fever and chills. This may occur in bursitis caused by infection. How is this diagnosed? Bursitis may be diagnosed by:  Medical history and physical exam.  MRI.  A procedure to drain fluid from the bursa with a needle (aspiration). The fluid may be checked for signs of infection or gout.  Blood tests to rule out other causes of inflammation. How is this treated? Bursitis can usually be treated at home with rest, ice, compression, and elevation (RICE). For mild bursitis, RICE treatment may be all you need. Other treatments may include:  Nonsteroidal anti-inflammatory drugs (NSAIDs) to treat pain and inflammation.  Corticosteroids to fight inflammation. You may have these drugs injected into and around the area of bursitis.  Aspiration of bursitis fluid to relieve pain and improve movement.  Antibiotic  medicine to treat an infected bursa.  A splint, brace, or walking aid.  Physical therapy if you continue to have pain or limited movement.  Surgery to remove a damaged or infected bursa. This may be needed if you have a very bad case of bursitis or if other treatments have not worked. Follow these instructions at home:  Take medicines only as directed by your health care  provider.  If you were prescribed an antibiotic medicine, finish it all even if you start to feel better.  Rest the affected area as directed by your health care provider.  Keep the area elevated.  Avoid activities that make pain worse.  Apply ice to the injured area:  Place ice in a plastic bag.  Place a towel between your skin and the bag.  Leave the ice on for 20 minutes, 2-3 times a day.  Use splints, braces, pads, or walking aids as directed by your health care provider.  Keep all follow-up visits as directed by your health care provider. This is important. How is this prevented?  Wear knee pads if you kneel often.  Wear sturdy running or walking shoes that fit you well.  Take regular breaks from repetitive activity.  Warm up by stretching before doing any strenuous activity.  Maintain a healthy weight or lose weight as recommended by your health care provider. Ask your health care provider if you need help.  Exercise regularly. Start any new physical activity gradually. Contact a health care provider if:  Your bursitis is not responding to treatment or home care.  You have a fever.  You have chills. This information is not intended to replace advice given to you by your health care provider. Make sure you discuss any questions you have with your health care provider. Document Released: 12/08/2000 Document Revised: 05/18/2016 Document Reviewed: 03/02/2014  2017 [employer]

## 2017-02-10 NOTE — Progress Notes (Addendum)
By signing my name below, I, Heather [name], attest that this documentation has been prepared under the direction and in the presence of [name], MD.  Electronically Signed: [name], Medical Scribe. 02/10/17. 10:12 AM.  Subjective:    Patient ID: Heather [name], female    DOB: 10-23-1961, 56 y.o.   MRN: [id number]  HPI Chief Complaint  Patient presents with  . Shoulder Pain    Left    HPI Comments: Heather [name] is a 56 y.o. female hx of osteoporosis and severe scoliosis brought in by her caretaker who presents to the [facility] complaining of left shoulder pain for "some time now". Has not had any imaging of her shoulder prior, though she did have a dexa bone scan several months ago. No prior hx of shoulder pain noted.  Shoulder Pain: Pt went to an exercise class at [employer] and she exercised with a 2 lbs weight. Since then she's felt pain in her shoulder, limiting her ROM. She reports when she rolls over on her shoulder it wakes her up. Pt suspects her scoliosis has weakened her arm. Pt had PT when a rod was put into her right leg -hasn't gone to PT for anything else. Took 2 ibuprofen with breakfast and dinner for the past 3-4 days for her sxs- not today.  Diarrhea: Pt had diarrhea after starting a new medication, desvenlafaxine succinate, 2 weeks ago. Pt was told this new medication would make her constipated so she began drinking a lot of water. Pt's caretaker states pt always has constipation seen in her abdominal x-rays and was considering giving polyethylene glycol for relief of pt's sxs. Pt had her colonoscopy last year. Denies GERD.  Here with Heather [name].  [name]'s brother [name] is Economist and guardian - he lives in [place], [place].  [facility] - works at [employer] and she is at [employer]. She is a 11 years waiting list to get community services. Lives with a friend of her mother who is deceased.    Patient Active Problem List    Diagnosis Date Noted  . Osteopenia 12/02/2016  . Scoliosis 06/15/2016  . BMI 30.0-30.9,adult 06/15/2016  . RIGHT knee fusion 06/15/2016  . Arthritis of knee 09/17/2014  . Arthritis 08/26/2014  . Anxiety state, unspecified 08/26/2014  . Migraine 08/26/2014  . Mental retardation 08/26/2014   Past Medical History:  Diagnosis Date  . Allergy   . Anxiety    history restored after error with record merge      . Arthritis    history restored after error with record merge      . Depression   . Fatty infiltration of liver    history restored after error with record merge      . Intestinal obstruction    as an infant history restored after error with record merge      . Mental retardation    history restored after error with record merge       Past Surgical History:  Procedure Laterality Date  . ABDOMINAL HYSTERECTOMY    . COLONOSCOPY    . fused knee    . KNEE DISLOCATION SURGERY    . POLYPECTOMY    . SIGMOIDOSCOPY     No Known Allergies Prior to Admission medications   Medication Sig Start Date End Date Taking? Authorizing Provider  ALPRAZolam (XANAX) 0.25 MG tablet Take 1-2 tablets (0.25-0.5 mg total) by mouth 2 (two) times daily as needed for  anxiety. 01/02/17   [name], PA-C  benztropine (COGENTIN) 0.5 MG tablet Take 0.5 mg by mouth 2 (two) times daily.    Historical Provider, MD  cetirizine (ZYRTEC) 10 MG tablet TAKE 1 TABLET (10 MG TOTAL) BY MOUTH DAILY. 01/22/17   [name], PA-C  cholecalciferol (VITAMIN D) 400 units TABS tablet Take 2,000 Units by mouth.    Historical Provider, MD  citalopram (CELEXA) 40 MG tablet Take 40 mg by mouth daily.    Historical Provider, MD  meloxicam (MOBIC) 7.5 MG tablet Take 1 tablet (7.5 mg total) by mouth daily. 07/14/16   Harrison Mons, PA-C   Social History   Social History  . Marital status: Single    Spouse name: n/a  . Number of children: 0  . Years of education: N/A   Occupational History  . [employer]     Social History Main Topics  . Smoking status: Never Smoker  . Smokeless tobacco: Never Used  . Alcohol use No  . Drug use: No  . Sexual activity: No   Other Topics Concern  . Not on file   Social History Narrative   Lives with [name], a close friend of her mother, and [name]'s adult son.       Review of Systems  Gastrointestinal: Positive for constipation and diarrhea. Negative for abdominal pain.  Musculoskeletal: Positive for arthralgias.  Psychiatric/Behavioral: Positive for sleep disturbance.   Objective:  Physical Exam  Constitutional: She appears well-developed and well-nourished. No distress.  HENT:  Head: Normocephalic and atraumatic.  Eyes: Conjunctivae are normal.  Neck: Neck supple.  Cardiovascular: Normal rate.   Pulmonary/Chest: Effort normal.  Musculoskeletal:  Tip of the acromion posteriorly Tender over the acromial bursa No pain over the scalupla Positive over the posterior joint, AC joint, and the corticoid Abduction limited to 30 degrees Internal ROM moderately restrict External ROM severely restricted Flexion test to 75 degrees Extension moderately Adduction moderately limited  Neurological: She is alert.  Skin: Skin is warm and dry.  Psychiatric: She has a normal mood and affect. Her behavior is normal.  Nursing note and vitals reviewed.  BP 98/60   Pulse 76   Temp 98.3 F (36.8 C) (Oral)   Resp 16   Ht 4\' 11"  (1.499 m)   Wt 145 lb (65.8 kg)   SpO2 98%   BMI 29.29 kg/m    Dg Shoulder Left  Result Date: 02/10/2017 CLINICAL DATA:  Pain for several weeks EXAM: LEFT SHOULDER - 2+ VIEW COMPARISON:  None. FINDINGS: No acute fracture. No dislocation.  Unremarkable soft tissues. IMPRESSION: No acute bony pathology. Electronically Signed   By: [name] M.D.   On: 02/10/2017 10:58   Assessment & Plan:  Consider addition of methocarbamol QHS if pain continued but I'm suspicious this is not covered by insurance. 1. Acute pain of  left shoulder   2. Chronic constipation   3. Thoracogenic scoliosis of thoracic region   4. Rotator cuff strain, left, initial encounter   5. Bursitis of left shoulder   6. Adhesive capsulitis of left shoulder     Orders Placed This Encounter  Procedures  . DG Shoulder Left    Standing Status:   Future    Number of Occurrences:   1    Standing Expiration Date:   02/10/2018    Order Specific Question:   Reason for Exam (SYMPTOM  OR DIAGNOSIS REQUIRED)    Answer:   severe shoulder ROM and diffuse pain for sev  wks, nki, h/o severe scoliosis    Order Specific Question:   Is the patient pregnant?    Answer:   No    Order Specific Question:   Preferred imaging location?    Answer:   External  . Ambulatory referral to Physical Therapy    Referral Priority:   Routine    Referral Type:   Physical Medicine    Referral Reason:   Specialty Services Required    Requested Specialty:   Physical Therapy    Number of Visits Requested:   1  . Ambulatory referral to Home Health    Referral Priority:   Routine    Referral Type:   Home Health Care    Referral Reason:   Specialty Services Required    Requested Specialty:   [place]    Number of Visits Requested:   1    Meds ordered this encounter  Medications  . Desvenlafaxine Succinate ER 25 MG TB24    Sig: Take by mouth.  . polyethylene glycol powder (GLYCOLAX/MIRALAX) powder    Sig: Take 0.5 g by mouth daily.    Dispense:  250 g  . meloxicam (MOBIC) 7.5 MG tablet    Sig: Take 1 tablet (7.5 mg total) by mouth daily.    Dispense:  30 tablet    Refill:  0    I personally performed the services described in this documentation, which was scribed in my presence. The recorded information has been reviewed and considered, and addended by me as needed.   [name], M.D.  Primary Care at [facility] [address] [place], [place] [address] [phone] phone [phone] fax  02/28/17 1:06 AM

## 2017-02-15 DIAGNOSIS — M7552 Bursitis of left shoulder: Secondary | ICD-10-CM | POA: Diagnosis not present

## 2017-02-15 DIAGNOSIS — M4134 Thoracogenic scoliosis, thoracic region: Secondary | ICD-10-CM | POA: Diagnosis not present

## 2017-02-15 DIAGNOSIS — M75102 Unspecified rotator cuff tear or rupture of left shoulder, not specified as traumatic: Secondary | ICD-10-CM | POA: Diagnosis not present

## 2017-02-15 DIAGNOSIS — Z791 Long term (current) use of non-steroidal anti-inflammatories (NSAID): Secondary | ICD-10-CM | POA: Diagnosis not present

## 2017-02-15 DIAGNOSIS — Z0389 Encounter for observation for other suspected diseases and conditions ruled out: Secondary | ICD-10-CM | POA: Diagnosis not present

## 2017-02-15 DIAGNOSIS — Z5181 Encounter for therapeutic drug level monitoring: Secondary | ICD-10-CM | POA: Diagnosis not present

## 2017-02-16 ENCOUNTER — Telehealth: Payer: Self-pay

## 2017-02-16 NOTE — Telephone Encounter (Signed)
[facility] physical therapist called stating that she has worked with the patient on her shoulder pain and is requesting further orders from [facility] to continue working with her.  Please advise  [facility]:[phone]

## 2017-02-19 NOTE — Telephone Encounter (Signed)
Therapist given verbal ok

## 2017-02-19 NOTE — Telephone Encounter (Signed)
Ok to continue

## 2017-02-19 NOTE — Telephone Encounter (Signed)
Yes, please. Thank you.

## 2017-02-22 DIAGNOSIS — M75102 Unspecified rotator cuff tear or rupture of left shoulder, not specified as traumatic: Secondary | ICD-10-CM | POA: Diagnosis not present

## 2017-02-22 DIAGNOSIS — Z791 Long term (current) use of non-steroidal anti-inflammatories (NSAID): Secondary | ICD-10-CM | POA: Diagnosis not present

## 2017-02-22 DIAGNOSIS — M7552 Bursitis of left shoulder: Secondary | ICD-10-CM | POA: Diagnosis not present

## 2017-02-22 DIAGNOSIS — M4134 Thoracogenic scoliosis, thoracic region: Secondary | ICD-10-CM | POA: Diagnosis not present

## 2017-02-23 DIAGNOSIS — M7552 Bursitis of left shoulder: Secondary | ICD-10-CM | POA: Diagnosis not present

## 2017-02-23 DIAGNOSIS — M75102 Unspecified rotator cuff tear or rupture of left shoulder, not specified as traumatic: Secondary | ICD-10-CM | POA: Diagnosis not present

## 2017-02-23 DIAGNOSIS — M4134 Thoracogenic scoliosis, thoracic region: Secondary | ICD-10-CM | POA: Diagnosis not present

## 2017-02-23 DIAGNOSIS — Z791 Long term (current) use of non-steroidal anti-inflammatories (NSAID): Secondary | ICD-10-CM | POA: Diagnosis not present

## 2017-03-01 DIAGNOSIS — Z791 Long term (current) use of non-steroidal anti-inflammatories (NSAID): Secondary | ICD-10-CM | POA: Diagnosis not present

## 2017-03-01 DIAGNOSIS — M4134 Thoracogenic scoliosis, thoracic region: Secondary | ICD-10-CM | POA: Diagnosis not present

## 2017-03-01 DIAGNOSIS — M75102 Unspecified rotator cuff tear or rupture of left shoulder, not specified as traumatic: Secondary | ICD-10-CM | POA: Diagnosis not present

## 2017-03-01 DIAGNOSIS — M7552 Bursitis of left shoulder: Secondary | ICD-10-CM | POA: Diagnosis not present

## 2017-03-02 DIAGNOSIS — M75102 Unspecified rotator cuff tear or rupture of left shoulder, not specified as traumatic: Secondary | ICD-10-CM | POA: Diagnosis not present

## 2017-03-02 DIAGNOSIS — M4134 Thoracogenic scoliosis, thoracic region: Secondary | ICD-10-CM | POA: Diagnosis not present

## 2017-03-02 DIAGNOSIS — Z791 Long term (current) use of non-steroidal anti-inflammatories (NSAID): Secondary | ICD-10-CM | POA: Diagnosis not present

## 2017-03-02 DIAGNOSIS — M7552 Bursitis of left shoulder: Secondary | ICD-10-CM | POA: Diagnosis not present

## 2017-03-16 DIAGNOSIS — Z79899 Other long term (current) drug therapy: Secondary | ICD-10-CM | POA: Diagnosis not present

## 2017-03-16 DIAGNOSIS — Z5181 Encounter for therapeutic drug level monitoring: Secondary | ICD-10-CM | POA: Diagnosis not present

## 2017-03-26 ENCOUNTER — Telehealth: Payer: Self-pay | Admitting: Physician Assistant

## 2017-03-26 NOTE — Telephone Encounter (Signed)
Will you rx w/o appt?

## 2017-03-26 NOTE — Telephone Encounter (Signed)
Please advise that without evaluating the rash, I am not able to prescribe a steroid cream. In general, it's ok to apply the OTC steroid cream/ointment to any areas of the rash that are not open and draining/oozing, and they are welcome to start with that. If the symptoms do not resolve, please encourage evaluation.

## 2017-03-26 NOTE — Telephone Encounter (Signed)
Caregiver advised needs ov but could try otc cream.  Will call back to make an appt.

## 2017-03-26 NOTE — Telephone Encounter (Signed)
Pts care taker called to see if Heather [name] could call in a steroid cream to help treat poison oak.  She states that she has tried benedryl and calimine lotion but it is not drying up.  I advised her that she needs to come in and be seen but refused. Please advise [phone]

## 2017-03-27 ENCOUNTER — Ambulatory Visit (INDEPENDENT_AMBULATORY_CARE_PROVIDER_SITE_OTHER): Payer: Medicare Other | Admitting: Family Medicine

## 2017-03-27 VITALS — BP 120/74 | HR 78 | Temp 97.9°F | Resp 16 | Ht 59.0 in | Wt 145.0 lb

## 2017-03-27 DIAGNOSIS — L237 Allergic contact dermatitis due to plants, except food: Secondary | ICD-10-CM

## 2017-03-27 MED ORDER — TRIAMCINOLONE ACETONIDE 0.1 % EX CREA: 1.0000 "application " | TOPICAL_CREAM | Freq: Three times a day (TID) | CUTANEOUS | 0 refills | Status: DC | PRN

## 2017-03-27 NOTE — Patient Instructions (Addendum)
Based on location of poison ivy, I do not think you need to be on prednisone or shots of prednisone at this point. You can apply the topical steroid up to 3 times per day to the affected areas, over-the-counter Calamine, Caladryl, or Ivy dry. If spread of rash to other areas, especially face, neck, or genitals, return as it you may need to be on prednisone at that time.   Poison Ivy Dermatitis Poison ivy dermatitis is inflammation of the skin that is caused by the allergens on the leaves of the poison ivy plant. The skin reaction often involves redness, swelling, blisters, and extreme itching. What are the causes? This condition is caused by a specific chemical (urushiol) found in the sap of the poison ivy plant. This chemical is sticky and can be easily spread to people, animals, and objects. You can get poison ivy dermatitis by:  Having direct contact with a poison ivy plant.  Touching animals, other people, or objects that have come in contact with poison ivy and have the chemical on them. What increases the risk? This condition is more likely to develop in:  People who are outdoors often.  People who go outdoors without wearing protective clothing, such as closed shoes, long pants, and a long-sleeved shirt. What are the signs or symptoms? Symptoms of this condition include:  Redness and itching.  A rash that often includes bumps and blisters. The rash usually appears 48 hours after exposure.  Swelling. This may occur if the reaction is more severe. Symptoms usually last for 1-2 weeks. However, the first time you develop this condition, symptoms may last 3-4 weeks. How is this diagnosed? This condition may be diagnosed based on your symptoms and a physical exam. Your health care provider may also ask you about any recent outdoor activity. How is this treated? Treatment for this condition will vary depending on how severe it is. Treatment may include:  Hydrocortisone creams or  calamine lotions to relieve itching.  Oatmeal baths to soothe the skin.  Over-the-counter antihistamine tablets.  Oral steroid medicine for more severe outbreaks. Follow these instructions at home:  Take or apply over-the-counter and prescription medicines only as told by your health care provider.  Wash exposed skin as soon as possible with soap and cold water.  Use hydrocortisone creams or calamine lotion as needed to soothe the skin and relieve itching.  Take oatmeal baths as needed. Use colloidal oatmeal. You can get this at your local pharmacy or grocery store. Follow the instructions on the packaging.  Do not scratch or rub your skin.  While you have the rash, wash clothes right after you wear them. How is this prevented?  Learn to identify the poison ivy plant and avoid contact with the plant. This plant can be recognized by the number of leaves. Generally, poison ivy has three leaves with flowering branches on a single stem. The leaves are typically glossy, and they have jagged edges that come to a point at the front.  If you have been exposed to poison ivy, thoroughly wash with soap and water right away. You have about 30 minutes to remove the plant resin before it will cause the rash. Be sure to wash under your fingernails because any plant resin there will continue to spread the rash.  When hiking or camping, wear clothes that will help you to avoid exposure on the skin. This includes long pants, a long-sleeved shirt, tall socks, and hiking boots. You can also apply preventive lotion  to your skin to help limit exposure.  If you suspect that your clothes or outdoor gear came in contact with poison ivy, rinse them off outside with a garden hose before you bring them inside your house. Contact a health care provider if:  You have open sores in the rash area.  You have more redness, swelling, or pain in the affected area.  You have redness that spreads beyond the rash  area.  You have fluid, blood, or pus coming from the affected area.  You have a fever.  You have a rash over a large area of your body.  You have a rash on your eyes, mouth, or genitals.  Your rash does not improve after a few days. Get help right away if:  Your face swells or your eyes swell shut.  You have trouble breathing.  You have trouble swallowing. This information is not intended to replace advice given to you by your health care provider. Make sure you discuss any questions you have with your health care provider. Document Released: 12/08/2000 Document Revised: 05/18/2016 Document Reviewed: 05/19/2015 Elsevier Interactive Patient Education  2017 [employer].    IF you received an x-ray today, you will receive an invoice from [facility]. Please contact [facility] at [phone] with questions or concerns regarding your invoice.   IF you received labwork today, you will receive an invoice from [facility]. Please contact [facility] at [phone] with questions or concerns regarding your invoice.   Our billing staff will not be able to assist you with questions regarding bills from these companies.  You will be contacted with the lab results as soon as they are available. The fastest way to get your results is to activate your My Chart account. Instructions are located on the last page of this paperwork. If you have not heard from [place] regarding the results in 2 weeks, please contact this office.

## 2017-03-27 NOTE — Progress Notes (Signed)
Subjective:  By signing my name below, I, Essence Howell, attest that this documentation has been prepared under the direction and in the presence of [name], MD Electronically Signed: Ladene Artist, ED Scribe 03/27/2017 at 11:33 AM.   Patient ID: Heather [name], female    DOB: [birth year], 56 y.o.   MRN: [id number]  Chief Complaint  Patient presents with  . Rash    both arms   HPI Heather [name] is a 56 y.o. female who presents to Primary Care at [facility] complaining of a pruritic rash on bilateral forearms first noticed a few days ago. Pt states that she was weeding outdoors a few days prior to noting the rash. She has applied a topical OTC anti-itch cream which contains Benadryl and Zinc as well as Benadryl tablets without significant relief.   Patient Active Problem List   Diagnosis Date Noted  . Osteopenia 12/02/2016  . Scoliosis 06/15/2016  . BMI 30.0-30.9,adult 06/15/2016  . RIGHT knee fusion 06/15/2016  . Arthritis of knee 09/17/2014  . Arthritis 08/26/2014  . Anxiety state, unspecified 08/26/2014  . Migraine 08/26/2014  . Mental retardation 08/26/2014   Past Medical History:  Diagnosis Date  . Allergy   . Anxiety    history restored after error with record merge      . Arthritis    history restored after error with record merge      . Depression   . Fatty infiltration of liver    history restored after error with record merge      . Intestinal obstruction    as an infant history restored after error with record merge      . Mental retardation    history restored after error with record merge       Past Surgical History:  Procedure Laterality Date  . ABDOMINAL HYSTERECTOMY    . COLONOSCOPY    . fused knee    . KNEE DISLOCATION SURGERY    . POLYPECTOMY    . SIGMOIDOSCOPY     No Known Allergies Prior to Admission medications   Medication Sig Start Date End Date Taking? Authorizing Provider  ALPRAZolam (XANAX) 0.25 MG tablet Take 1-2 tablets  (0.25-0.5 mg total) by mouth 2 (two) times daily as needed for anxiety. 01/02/17  Yes [name], PA-C  diphenhydrAMINE (BENADRYL) 50 MG tablet Take 50 mg by mouth at bedtime as needed for itching.   Yes Historical Provider, MD  meloxicam (MOBIC) 7.5 MG tablet Take 1 tablet (7.5 mg total) by mouth daily. 02/10/17  Yes Shawnee Knapp, MD  Desvenlafaxine Succinate ER 25 MG TB24 Take by mouth.    Historical Provider, MD  polyethylene glycol powder (GLYCOLAX/MIRALAX) powder Take 0.5 g by mouth daily. Patient not taking: Reported on 03/27/2017 02/10/17   Shawnee Knapp, MD   Social History   Social History  . Marital status: Single    Spouse name: n/a  . Number of children: 0  . Years of education: N/A   Occupational History  . [employer]   Social History Main Topics  . Smoking status: Never Smoker  . Smokeless tobacco: Never Used  . Alcohol use No  . Drug use: No  . Sexual activity: No   Other Topics Concern  . Not on file   Social History Narrative   Lives with [name], a close friend of her mother, and [name]'s adult son.       Review of Systems  Skin: Positive for  rash.      Objective:   Physical Exam  Constitutional: She is oriented to person, place, and time. She appears well-developed and well-nourished. No distress.  HENT:  Head: Normocephalic and atraumatic.  Eyes: Conjunctivae and EOM are normal.  Neck: Neck supple. No tracheal deviation present.  Cardiovascular: Normal rate.   Pulmonary/Chest: Effort normal. No respiratory distress.  Musculoskeletal: Normal range of motion.  Neurological: She is alert and oriented to person, place, and time.  Skin: Skin is warm and dry. Rash noted.  Clustered vesicular rash over volar forearms on L and antecubital area on L. Few scattered erythematous patches with vesicles on R forearm. 1 small area over the antecubital of the R arm. Hands and fingers are not involved.   Psychiatric: She has a normal mood and affect.  Her behavior is normal.  Nursing note and vitals reviewed.  Vitals:   03/27/17 1106  BP: 120/74  Pulse: 78  Resp: 16  Temp: 97.9 F (36.6 C)  TempSrc: Oral  SpO2: 98%  Weight: 145 lb (65.8 kg)  Height: 4\' 11"  (1.499 m)      Assessment & Plan:    Heather [name] is a 56 y.o. female Contact dermatitis due to poison ivy - Plan: triamcinolone cream (KENALOG) 0.1 %  - Typical rash of poison ivy across the volar forearms only. Does not extend to hands or above elbows. No face, genital involvement, or other body areas affected.  -Start triamcinolone topical twice a day to 3 times a day, over-the-counter calamine or other poison ivy formulation is okay. RTC precautions given if spread or worsening symptoms.  Meds ordered this encounter  Medications  . diphenhydrAMINE (BENADRYL) 50 MG tablet    Sig: Take 50 mg by mouth at bedtime as needed for itching.  . triamcinolone cream (KENALOG) 0.1 %    Sig: Apply 1 application topically 3 (three) times daily as needed.    Dispense:  30 g    Refill:  0   Patient Instructions    Based on location of poison ivy, I do not think you need to be on prednisone or shots of prednisone at this point. You can apply the topical steroid up to 3 times per day to the affected areas, over-the-counter Calamine, Caladryl, or Ivy dry. If spread of rash to other areas, especially face, neck, or genitals, return as it you may need to be on prednisone at that time.   Poison Ivy Dermatitis Poison ivy dermatitis is inflammation of the skin that is caused by the allergens on the leaves of the poison ivy plant. The skin reaction often involves redness, swelling, blisters, and extreme itching. What are the causes? This condition is caused by a specific chemical (urushiol) found in the sap of the poison ivy plant. This chemical is sticky and can be easily spread to people, animals, and objects. You can get poison ivy dermatitis by:  Having direct contact with a  poison ivy plant.  Touching animals, other people, or objects that have come in contact with poison ivy and have the chemical on them. What increases the risk? This condition is more likely to develop in:  People who are outdoors often.  People who go outdoors without wearing protective clothing, such as closed shoes, long pants, and a long-sleeved shirt. What are the signs or symptoms? Symptoms of this condition include:  Redness and itching.  A rash that often includes bumps and blisters. The rash usually appears 48 hours after exposure.  Swelling. This may occur if the reaction is more severe. Symptoms usually last for 1-2 weeks. However, the first time you develop this condition, symptoms may last 3-4 weeks. How is this diagnosed? This condition may be diagnosed based on your symptoms and a physical exam. Your health care provider may also ask you about any recent outdoor activity. How is this treated? Treatment for this condition will vary depending on how severe it is. Treatment may include:  Hydrocortisone creams or calamine lotions to relieve itching.  Oatmeal baths to soothe the skin.  Over-the-counter antihistamine tablets.  Oral steroid medicine for more severe outbreaks. Follow these instructions at home:  Take or apply over-the-counter and prescription medicines only as told by your health care provider.  Wash exposed skin as soon as possible with soap and cold water.  Use hydrocortisone creams or calamine lotion as needed to soothe the skin and relieve itching.  Take oatmeal baths as needed. Use colloidal oatmeal. You can get this at your local pharmacy or grocery store. Follow the instructions on the packaging.  Do not scratch or rub your skin.  While you have the rash, wash clothes right after you wear them. How is this prevented?  Learn to identify the poison ivy plant and avoid contact with the plant. This plant can be recognized by the number of leaves.  Generally, poison ivy has three leaves with flowering branches on a single stem. The leaves are typically glossy, and they have jagged edges that come to a point at the front.  If you have been exposed to poison ivy, thoroughly wash with soap and water right away. You have about 30 minutes to remove the plant resin before it will cause the rash. Be sure to wash under your fingernails because any plant resin there will continue to spread the rash.  When hiking or camping, wear clothes that will help you to avoid exposure on the skin. This includes long pants, a long-sleeved shirt, tall socks, and hiking boots. You can also apply preventive lotion to your skin to help limit exposure.  If you suspect that your clothes or outdoor gear came in contact with poison ivy, rinse them off outside with a garden hose before you bring them inside your house. Contact a health care provider if:  You have open sores in the rash area.  You have more redness, swelling, or pain in the affected area.  You have redness that spreads beyond the rash area.  You have fluid, blood, or pus coming from the affected area.  You have a fever.  You have a rash over a large area of your body.  You have a rash on your eyes, mouth, or genitals.  Your rash does not improve after a few days. Get help right away if:  Your face swells or your eyes swell shut.  You have trouble breathing.  You have trouble swallowing. This information is not intended to replace advice given to you by your health care provider. Make sure you discuss any questions you have with your health care provider. Document Released: 12/08/2000 Document Revised: 05/18/2016 Document Reviewed: 05/19/2015 Elsevier Interactive Patient Education  2017 [employer].    IF you received an x-ray today, you will receive an invoice from [facility]. Please contact [facility] at [phone] with questions or concerns regarding your  invoice.   IF you received labwork today, you will receive an invoice from [place]. Please contact [facility] at [phone] with questions or concerns regarding your  invoice.   Our billing staff will not be able to assist you with questions regarding bills from these companies.  You will be contacted with the lab results as soon as they are available. The fastest way to get your results is to activate your My Chart account. Instructions are located on the last page of this paperwork. If you have not heard from [place] regarding the results in 2 weeks, please contact this office.       I personally performed the services described in this documentation, which was scribed in my presence. The recorded information has been reviewed and considered for accuracy and completeness, addended by me as needed, and agree with information above.  Signed,   Merri Ray, MD Primary Care at [facility].  03/27/17 11:47 AM

## 2017-04-11 DIAGNOSIS — Z79899 Other long term (current) drug therapy: Secondary | ICD-10-CM | POA: Diagnosis not present

## 2017-04-11 DIAGNOSIS — Z5181 Encounter for therapeutic drug level monitoring: Secondary | ICD-10-CM | POA: Diagnosis not present

## 2017-05-04 ENCOUNTER — Encounter: Payer: Self-pay | Admitting: Physician Assistant

## 2017-05-04 ENCOUNTER — Ambulatory Visit (INDEPENDENT_AMBULATORY_CARE_PROVIDER_SITE_OTHER): Payer: Medicare Other | Admitting: Physician Assistant

## 2017-05-04 VITALS — BP 99/66 | HR 80 | Temp 98.3°F | Resp 18 | Ht 59.0 in | Wt 146.6 lb

## 2017-05-04 DIAGNOSIS — L723 Sebaceous cyst: Secondary | ICD-10-CM

## 2017-05-04 DIAGNOSIS — W57XXXA Bitten or stung by nonvenomous insect and other nonvenomous arthropods, initial encounter: Secondary | ICD-10-CM

## 2017-05-04 MED ORDER — TRIAMCINOLONE ACETONIDE 0.1 % EX CREA: TOPICAL_CREAM | CUTANEOUS | 0 refills | Status: DC

## 2017-05-04 NOTE — Progress Notes (Signed)
Patient ID: Heather [name], female    DOB: [birth year], 56 y.o.   MRN: [id number]  PCP: Harrison Mons, PA-C  Chief Complaint  Patient presents with  . leg issues    has spot on her left leg and under her right arm    Subjective:   Presents for evaluation of a lesion on the back of the LEFT leg. She is accompanied by Heather [name], her care assistant.   I haven't seen Heather [name] in a number of months. She is here today worried about the lesion on the back of the LEFT leg, and a small lump in the RIGHT axilla.  The leg lesion was noted after treatment for poison ivy. It's itchy and red. She doesn't have any more of the cream she used for the poison ivy.  The bump in the armpit is chronic. It periodically becomes larger, red and painful and requires treatment. Apparently told it is due to ingrown hair and she needs to be careful with shaving the underarm hair. It is not presently painful, red or swollen.    Review of Systems As above.    Patient Active Problem List   Diagnosis Date Noted  . Osteopenia 12/02/2016  . Scoliosis 06/15/2016  . BMI 30.0-30.9,adult 06/15/2016  . RIGHT knee fusion 06/15/2016  . Arthritis of knee 09/17/2014  . Arthritis 08/26/2014  . Anxiety state, unspecified 08/26/2014  . Migraine 08/26/2014  . Mental retardation 08/26/2014     Prior to Admission medications   Medication Sig Start Date End Date Taking? Authorizing Provider  Desvenlafaxine Succinate ER 25 MG TB24 Take by mouth.   Yes [provider]  ALPRAZolam (XANAX) 0.25 MG tablet Take 1-2 tablets (0.25-0.5 mg total) by mouth 2 (two) times daily as needed for anxiety. Patient not taking: Reported on 05/04/2017 01/02/17   Harrison Mons, PA-C  triamcinolone cream (KENALOG) 0.1 % APPLY TOPICALLY 3 TIMES A DAY AS NEEDED 03/27/17   [provider]     No Known Allergies     Objective:  Physical Exam  Constitutional: She is oriented to person, place, and time. She appears  well-developed and well-nourished. She is active and cooperative. No distress.  BP 99/66   Pulse 80   Temp 98.3 F (36.8 C) (Oral)   Resp 18   Ht 4\' 11"  (1.499 m)   Wt 146 lb 9.6 oz (66.5 kg)   SpO2 96%   BMI 29.61 kg/m    Eyes: Conjunctivae are normal.  Pulmonary/Chest: Effort normal.  Musculoskeletal:  RIGHT knee is fused in extension. LEFT leg with knee immobilizer, which is located approximately 8 inches below intended placement.  Neurological: She is alert and oriented to person, place, and time.  Skin:     Psychiatric: She has a normal mood and affect. Her speech is normal and behavior is normal.           Assessment & Plan:   1. Insect bite, initial encounter Supportive care.  Anticipatory guidance.  RTC if symptoms worsen/persist. Reassurance. - triamcinolone cream (KENALOG) 0.1 %; Apply thin layer to affected area TID PRN itching  Dispense: 30 g; Refill: 0  2. Sebaceous cyst of right axilla Reassurance.  Encouraged her to contact her orthopedic specialist for a larger LEFT knee apparatus, as the one she is wearing (purchased from a third party and not professionally fitted) cannot be properly placed.    No Follow-up on file.   Fara Chute, PA-C Primary Care at [facility]

## 2017-05-04 NOTE — Patient Instructions (Addendum)
Talk with your orthopedic doctor about getting a new knee brace. The one you are wearing is too small for your thigh.  Use the cream (the same that you had for the poison ivy) on the three little bites on your leg.  The small bump under the arm is a sebaceous cyst. It is not a problem. On occasion, they can become infected. If it becomes red, swollen, painful or starts to drain pus, come back in.    IF you received an x-ray today, you will receive an invoice from [facility]. Please contact [facility] at [phone] with questions or concerns regarding your invoice.   IF you received labwork today, you will receive an invoice from [place]. Please contact [facility] at [phone] with questions or concerns regarding your invoice.   Our billing staff will not be able to assist you with questions regarding bills from these companies.  You will be contacted with the lab results as soon as they are available. The fastest way to get your results is to activate your My Chart account. Instructions are located on the last page of this paperwork. If you have not heard from [place] regarding the results in 2 weeks, please contact this office.

## 2017-05-08 DIAGNOSIS — Z5181 Encounter for therapeutic drug level monitoring: Secondary | ICD-10-CM | POA: Diagnosis not present

## 2017-05-08 DIAGNOSIS — Z79899 Other long term (current) drug therapy: Secondary | ICD-10-CM | POA: Diagnosis not present

## 2017-06-07 DIAGNOSIS — Z79899 Other long term (current) drug therapy: Secondary | ICD-10-CM | POA: Diagnosis not present

## 2017-06-07 DIAGNOSIS — Z5181 Encounter for therapeutic drug level monitoring: Secondary | ICD-10-CM | POA: Diagnosis not present

## 2017-06-19 ENCOUNTER — Telehealth: Payer: Self-pay

## 2017-06-19 NOTE — Telephone Encounter (Signed)
Called pt to schedule Medicare Annual Wellness Visit. -nr  

## 2017-07-05 DIAGNOSIS — Z79899 Other long term (current) drug therapy: Secondary | ICD-10-CM | POA: Diagnosis not present

## 2017-07-05 DIAGNOSIS — Z5181 Encounter for therapeutic drug level monitoring: Secondary | ICD-10-CM | POA: Diagnosis not present

## 2017-08-08 DIAGNOSIS — Z79899 Other long term (current) drug therapy: Secondary | ICD-10-CM | POA: Diagnosis not present

## 2017-08-08 DIAGNOSIS — Z5181 Encounter for therapeutic drug level monitoring: Secondary | ICD-10-CM | POA: Diagnosis not present

## 2017-08-20 ENCOUNTER — Telehealth: Payer: Self-pay | Admitting: Physician Assistant

## 2017-08-20 NOTE — Telephone Encounter (Signed)
Form received by fax from patient's guardian and caregiver. Please contact the patient/guardian to verify answers to the questions, then return to my box for signature.

## 2017-08-21 NOTE — Telephone Encounter (Signed)
Spoke with caregiver and filled out paperwork. Signed by Chelle. Paperwork faxed back to caregiver.

## 2017-09-05 DIAGNOSIS — Z79899 Other long term (current) drug therapy: Secondary | ICD-10-CM | POA: Diagnosis not present

## 2017-09-05 DIAGNOSIS — Z5181 Encounter for therapeutic drug level monitoring: Secondary | ICD-10-CM | POA: Diagnosis not present

## 2017-09-20 DIAGNOSIS — H524 Presbyopia: Secondary | ICD-10-CM | POA: Diagnosis not present

## 2017-10-03 DIAGNOSIS — Z5181 Encounter for therapeutic drug level monitoring: Secondary | ICD-10-CM | POA: Diagnosis not present

## 2017-10-03 DIAGNOSIS — Z79899 Other long term (current) drug therapy: Secondary | ICD-10-CM | POA: Diagnosis not present

## 2017-11-01 DIAGNOSIS — Z5181 Encounter for therapeutic drug level monitoring: Secondary | ICD-10-CM | POA: Diagnosis not present

## 2017-11-01 DIAGNOSIS — Z79899 Other long term (current) drug therapy: Secondary | ICD-10-CM | POA: Diagnosis not present

## 2017-11-14 ENCOUNTER — Encounter: Payer: Self-pay | Admitting: Physician Assistant

## 2017-11-14 ENCOUNTER — Ambulatory Visit (INDEPENDENT_AMBULATORY_CARE_PROVIDER_SITE_OTHER): Payer: Medicare Other | Admitting: Physician Assistant

## 2017-11-14 VITALS — BP 120/72 | HR 92 | Temp 97.9°F | Resp 16 | Ht 59.0 in | Wt 146.4 lb

## 2017-11-14 DIAGNOSIS — L089 Local infection of the skin and subcutaneous tissue, unspecified: Secondary | ICD-10-CM

## 2017-11-14 DIAGNOSIS — L723 Sebaceous cyst: Secondary | ICD-10-CM | POA: Diagnosis not present

## 2017-11-14 MED ORDER — DOXYCYCLINE HYCLATE 100 MG PO CAPS: 100.0000 mg | ORAL_CAPSULE | Freq: Two times a day (BID) | ORAL | 0 refills | Status: AC

## 2017-11-14 MED ORDER — DOXYCYCLINE HYCLATE 100 MG PO CAPS
100.0000 mg | ORAL_CAPSULE | Freq: Two times a day (BID) | ORAL | 0 refills | Status: DC
Start: 2017-11-14 — End: 2017-11-14

## 2017-11-14 NOTE — Patient Instructions (Addendum)
Please change the top dressing to the right armpit.  This is daily or when it gets soiled.    I would like you to take the antibiotic prescribed. You will need to change the packing in 3 days.  This will need to be done at an urgent care.  If this is looking okay you can remove the packing and just use dressings, until your return on Monday.  Incision and Drainage, Care After Refer to this sheet in the next few weeks. These instructions provide you with information about caring for yourself after your procedure. Your health care provider may also give you more specific instructions. Your treatment has been planned according to current medical practices, but problems sometimes occur. Call your health care provider if you have any problems or questions after your procedure. What can I expect after the procedure? After the procedure, it is common to have:  Pain or discomfort around your incision site.  Drainage from your incision.  Follow these instructions at home:  Take over-the-counter and prescription medicines only as told by your health care provider.  If you were prescribed an antibiotic medicine, take it as told by your health care provider.Do not stop taking the antibiotic even if you start to feel better.  Followinstructions from your health care provider about: ? How to take care of your incision. ? When and how you should change your packing and bandage (dressing). Wash your hands with soap and water before you change your dressing. If soap and water are not available, use hand sanitizer. ? When you should remove your dressing.  Do not take baths, swim, or use a hot tub until your health care provider approves.  Keep all follow-up visits as told by your health care provider. This is important.  Check your incision area every day for signs of infection. Check for: ? More redness, swelling, or pain. ? More fluid or blood. ? Warmth. ? Pus or a bad smell. Contact a health  care provider if:  Your cyst or abscess returns.  You have a fever.  You have more redness, swelling, or pain around your incision.  You have more fluid or blood coming from your incision.  Your incision feels warm to the touch.  You have pus or a bad smell coming from your incision. Get help right away if:  You have severe pain or bleeding.  You cannot eat or drink without vomiting.  You have decreased urine output.  You become short of breath.  You have chest pain.  You cough up blood.  The area where the incision and drainage occurred becomes numb or it tingles. This information is not intended to replace advice given to you by your health care provider. Make sure you discuss any questions you have with your health care provider. Document Released: 03/04/2012 Document Revised: 05/12/2016 Document Reviewed: 10/01/2015 Elsevier Interactive Patient Education  2017 [employer].    IF you received an x-ray today, you will receive an invoice from [facility]. Please contact [facility] at [phone] with questions or concerns regarding your invoice.   IF you received labwork today, you will receive an invoice from [place]. Please contact [facility] at [phone] with questions or concerns regarding your invoice.   Our billing staff will not be able to assist you with questions regarding bills from these companies.  You will be contacted with the lab results as soon as they are available. The fastest way to get your results is to activate your  My Chart account. Instructions are located on the last page of this paperwork. If you have not heard from [place] regarding the results in 2 weeks, please contact this office.

## 2017-11-17 LAB — WOUND CULTURE

## 2017-11-19 ENCOUNTER — Ambulatory Visit (INDEPENDENT_AMBULATORY_CARE_PROVIDER_SITE_OTHER): Payer: Medicare Other | Admitting: Physician Assistant

## 2017-11-19 ENCOUNTER — Encounter: Payer: Self-pay | Admitting: Physician Assistant

## 2017-11-19 ENCOUNTER — Other Ambulatory Visit: Payer: Self-pay

## 2017-11-19 VITALS — BP 116/68 | HR 80 | Temp 98.3°F | Resp 16 | Ht 59.0 in | Wt 146.0 lb

## 2017-11-19 DIAGNOSIS — Z5189 Encounter for other specified aftercare: Secondary | ICD-10-CM

## 2017-11-19 DIAGNOSIS — L723 Sebaceous cyst: Secondary | ICD-10-CM

## 2017-11-19 DIAGNOSIS — L089 Local infection of the skin and subcutaneous tissue, unspecified: Secondary | ICD-10-CM

## 2017-11-19 NOTE — Progress Notes (Signed)
PRIMARY CARE AT [facility] [address] [place] [address] [phone]  Date:  11/19/2017   Name:  Heather [name]   DOB:  03/04/1961   MRN:  [id number]  PCP:  Harrison Mons, PA-C    History of Present Illness:  Heather [name] is a 56 y.o. female patient who presents to PCP with  Chief Complaint  Patient presents with  . Follow-up    wound care/ abcess underarm     No difficulty with the wound.  She reports that this is mildly burning sensation at the wound site.  No fever or drainage.  She is changing the dressing by her brother when she was away from her facility for the holidays.  She will have to return by [employer] bus alone if she needs to return in the next 2-3 days.   Patient Active Problem List   Diagnosis Date Noted  . Osteopenia 12/02/2016  . Scoliosis 06/15/2016  . BMI 30.0-30.9,adult 06/15/2016  . RIGHT knee fusion 06/15/2016  . Arthritis of knee 09/17/2014  . Arthritis 08/26/2014  . Anxiety state, unspecified 08/26/2014  . Migraine 08/26/2014  . Mental retardation 08/26/2014    Past Medical History:  Diagnosis Date  . Allergy   . Anxiety    history restored after error with record merge      . Arthritis    history restored after error with record merge      . Depression   . Fatty infiltration of liver    history restored after error with record merge      . Intestinal obstruction (HCC)    as an infant history restored after error with record merge      . Mental retardation    history restored after error with record merge        Past Surgical History:  Procedure Laterality Date  . ABDOMINAL HYSTERECTOMY    . COLONOSCOPY    . fused knee    . KNEE DISLOCATION SURGERY    . POLYPECTOMY    . SIGMOIDOSCOPY      Social History   Tobacco Use  . Smoking status: Never Smoker  . Smokeless tobacco: Never Used  Substance Use Topics  . Alcohol use: No    Alcohol/week: 0.0 oz  . Drug use: No    Family History  Problem Relation Age of Onset  .  Diabetes Mother        history restored after error with record merge      . Colon cancer Neg Hx     No Known Allergies  Medication list has been reviewed and updated.  Current Outpatient Medications on File Prior to Visit  Medication Sig Dispense Refill  . Desvenlafaxine Succinate ER 25 MG TB24 Take by mouth.    . doxycycline (VIBRAMYCIN) 100 MG capsule Take 1 capsule (100 mg total) by mouth 2 (two) times daily for 7 days. 14 capsule 0  . triamcinolone cream (KENALOG) 0.1 % Apply thin layer to affected area TID PRN itching 30 g 0  . ALPRAZolam (XANAX) 0.25 MG tablet Take 1-2 tablets (0.25-0.5 mg total) by mouth 2 (two) times daily as needed for anxiety. (Patient not taking: Reported on 05/04/2017) 60 tablet 0   No current facility-administered medications on file prior to visit.     ROS ROS otherwise unremarkable unless listed above.  Physical Examination: BP 116/68   Pulse 80   Temp 98.3 F (36.8 C) (Oral)   Resp 16   Ht 4'  11" (1.499 m)   Wt 146 lb (66.2 kg)   SpO2 97%   BMI 29.49 kg/m  Ideal Body Weight: Weight in (lb) to have BMI = 25: 123.5  Physical Exam  Constitutional: She is oriented to person, place, and time. She appears well-developed and well-nourished. No distress.  HENT:  Head: Normocephalic and atraumatic.  Right Ear: External ear normal.  Left Ear: External ear normal.  Eyes: Conjunctivae and EOM are normal. Pupils are equal, round, and reactive to light.  Cardiovascular: Normal rate.  Pulmonary/Chest: Effort normal. No respiratory distress.  Neurological: She is alert and oriented to person, place, and time.  Skin: She is not diaphoretic.  Right axillary with packing in place.  Sanguinous and minimal sebaceous material removed from the wound site.  Depth is .5cm.  No purulent drainage or necrosis.  Psychiatric: She has a normal mood and affect. Her behavior is normal.     Assessment and Plan: Heather [name] is a 56 y.o. female who is here today  for wound care.  --advised to remove the packing in 2 days.   -wound care discussed and alarming symptom to warrant an immediate return.  Encounter for wound care  Infected sebaceous cyst  Sebaceous cyst of right axilla  Ivar Drape, PA-C [facility] 11/26/201812:07 PM

## 2017-11-19 NOTE — Patient Instructions (Addendum)
After 2 days, you may remove the packing.  Please wash the wound twice per day with soap and water.  This can be antibacterial, or Gold Dial soap.  Avoid deodorant soap, and high fragrant soaps.  Gently wash the wound then cover with a bandage.  Continue to cover until this scabs.    Incision and Drainage, Care After Refer to this sheet in the next few weeks. These instructions provide you with information about caring for yourself after your procedure. Your health care provider may also give you more specific instructions. Your treatment has been planned according to current medical practices, but problems sometimes occur. Call your health care provider if you have any problems or questions after your procedure. What can I expect after the procedure? After the procedure, it is common to have:  Pain or discomfort around your incision site.  Drainage from your incision.  Follow these instructions at home:  Take over-the-counter and prescription medicines only as told by your health care provider.  If you were prescribed an antibiotic medicine, take it as told by your health care provider.Do not stop taking the antibiotic even if you start to feel better.  Followinstructions from your health care provider about: ? How to take care of your incision. ? When and how you should change your packing and bandage (dressing). Wash your hands with soap and water before you change your dressing. If soap and water are not available, use hand sanitizer. ? When you should remove your dressing.  Do not take baths, swim, or use a hot tub until your health care provider approves.  Keep all follow-up visits as told by your health care provider. This is important.  Check your incision area every day for signs of infection. Check for: ? More redness, swelling, or pain. ? More fluid or blood. ? Warmth. ? Pus or a bad smell. Contact a health care provider if:  Your cyst or abscess returns.  You have a  fever.  You have more redness, swelling, or pain around your incision.  You have more fluid or blood coming from your incision.  Your incision feels warm to the touch.  You have pus or a bad smell coming from your incision. Get help right away if:  You have severe pain or bleeding.  You cannot eat or drink without vomiting.  You have decreased urine output.  You become short of breath.  You have chest pain.  You cough up blood.  The area where the incision and drainage occurred becomes numb or it tingles. This information is not intended to replace advice given to you by your health care provider. Make sure you discuss any questions you have with your health care provider. Document Released: 03/04/2012 Document Revised: 05/12/2016 Document Reviewed: 10/01/2015 Elsevier Interactive Patient Education  2017 [employer].    IF you received an x-ray today, you will receive an invoice from [facility]. Please contact [facility] at [phone] with questions or concerns regarding your invoice.   IF you received labwork today, you will receive an invoice from [place]. Please contact [facility] at [phone] with questions or concerns regarding your invoice.   Our billing staff will not be able to assist you with questions regarding bills from these companies.  You will be contacted with the lab results as soon as they are available. The fastest way to get your results is to activate your My Chart account. Instructions are located on the last page of this paperwork. If you  have not heard from [place] regarding the results in 2 weeks, please contact this office.

## 2017-11-20 NOTE — Progress Notes (Signed)
PRIMARY CARE AT [facility] [address] [place] [address] [phone]  Date:  11/14/2017   Name:  Heather [name]   DOB:  November 05, 1961   MRN:  [id number]  PCP:  Harrison Mons, PA-C    History of Present Illness:  Heather [name] is a 56 y.o. female patient who presents to PCP with  Chief Complaint  Patient presents with  . abscess    right armpit     Right armpit abscess over several days.  This has swollen.  No drainage.  She has had this before.  Painful at this area.  No fever.  She does shave her armpits.  Patient notes that there has been a bump on the area. No difficulty with use of her arm.  Patient Active Problem List   Diagnosis Date Noted  . Osteopenia 12/02/2016  . Scoliosis 06/15/2016  . BMI 30.0-30.9,adult 06/15/2016  . RIGHT knee fusion 06/15/2016  . Arthritis of knee 09/17/2014  . Arthritis 08/26/2014  . Anxiety state, unspecified 08/26/2014  . Migraine 08/26/2014  . Mental retardation 08/26/2014    Past Medical History:  Diagnosis Date  . Allergy   . Anxiety    history restored after error with record merge      . Arthritis    history restored after error with record merge      . Depression   . Fatty infiltration of liver    history restored after error with record merge      . Intestinal obstruction (HCC)    as an infant history restored after error with record merge      . Mental retardation    history restored after error with record merge        Past Surgical History:  Procedure Laterality Date  . ABDOMINAL HYSTERECTOMY    . COLONOSCOPY    . fused knee    . KNEE DISLOCATION SURGERY    . POLYPECTOMY    . SIGMOIDOSCOPY      Social History   Tobacco Use  . Smoking status: Never Smoker  . Smokeless tobacco: Never Used  Substance Use Topics  . Alcohol use: No    Alcohol/week: 0.0 oz  . Drug use: No    Family History  Problem Relation Age of Onset  . Diabetes Mother        history restored after error with record merge       . Colon cancer Neg Hx     No Known Allergies  Medication list has been reviewed and updated.  Current Outpatient Medications on File Prior to Visit  Medication Sig Dispense Refill  . ALPRAZolam (XANAX) 0.25 MG tablet Take 1-2 tablets (0.25-0.5 mg total) by mouth 2 (two) times daily as needed for anxiety. (Patient not taking: Reported on 05/04/2017) 60 tablet 0  . Desvenlafaxine Succinate ER 25 MG TB24 Take by mouth.    . triamcinolone cream (KENALOG) 0.1 % Apply thin layer to affected area TID PRN itching 30 g 0   No current facility-administered medications on file prior to visit.     ROS ROS otherwise unremarkable unless listed above.  Physical Examination: BP 120/72   Pulse 92   Temp 97.9 F (36.6 C) (Oral)   Resp 16   Ht 4\' 11"  (1.499 m)   Wt 146 lb 6.4 oz (66.4 kg)   SpO2 99%   BMI 29.57 kg/m  Ideal Body Weight: Weight in (lb) to have BMI = 25: 123.5  Physical Exam  Constitutional: She is oriented to person, place, and time. She appears well-developed and well-nourished. No distress.  HENT:  Head: Normocephalic and atraumatic.  Right Ear: External ear normal.  Left Ear: External ear normal.  Eyes: Conjunctivae and EOM are normal. Pupils are equal, round, and reactive to light.  Cardiovascular: Normal rate.  Pulmonary/Chest: Effort normal. No respiratory distress.  Neurological: She is alert and oriented to person, place, and time.  Skin: She is not diaphoretic.  Right axillary abscess with swelling prominent.  With gentle palpation, purulent drainage expressed.  Localized erythema at the site.    Psychiatric: She has a normal mood and affect. Her behavior is normal.   Procedure: verbal consent obtained.  1% lidocaine placed at the abscess site.  Anesthesia obtained.  1cm incision placed at the abscess site at the right axillary about 2cm in diameter.  Purulent and sebaceous material is removed from the site. Searched for loculations, none detected.  Cleansed with  normal saline.  Quarter packing placed aggressively.  Dressings placed.   Assessment and Plan: [name] is a 56 y.o. female who is here today for cc of  Chief Complaint  Patient presents with  . abscess    right armpit  --started on doxycycline.   --she will go to her brothers this weekend.  Discussed wound care to employee at her facility.  She will return in 5 days.  Described wound care that can be obtained at an urgent care while she is out of town.  alarming symptoms discussed to warrant an immediate return.   Infected sebaceous cyst - Plan: WOUND CULTURE  Ivar Drape, PA-C [facility] 11/27/20189:08 AM

## 2017-12-12 DIAGNOSIS — Z79899 Other long term (current) drug therapy: Secondary | ICD-10-CM | POA: Diagnosis not present

## 2017-12-12 DIAGNOSIS — Z5181 Encounter for therapeutic drug level monitoring: Secondary | ICD-10-CM | POA: Diagnosis not present

## 2017-12-27 ENCOUNTER — Telehealth: Payer: Self-pay | Admitting: Physician Assistant

## 2017-12-27 NOTE — Telephone Encounter (Signed)
Copied from [place] [id number]. Topic: Quick Communication - See Telephone Encounter >> Dec 27, 2017 12:16 PM Oneta Rack wrote: CRM for notification. See Telephone encounter for:   12/27/17.  Relation to pt: self Call back [phone]   Reason for call:  Patient states she was notfied by insurance Daphane Shepherd, [place] is not in [facility] network, patient is displeased.  Patient states she would like to transfer to Cindee Lame MD who's in network.  Patient requesting referral to [facility], please advise

## 2017-12-28 NOTE — Telephone Encounter (Signed)
Patient has an appointment on the 10th with Dr. Carlota Raspberry and the referral can be placed after her visit.

## 2018-01-03 ENCOUNTER — Encounter: Payer: Self-pay | Admitting: Family Medicine

## 2018-01-03 ENCOUNTER — Other Ambulatory Visit: Payer: Self-pay

## 2018-01-03 ENCOUNTER — Ambulatory Visit (INDEPENDENT_AMBULATORY_CARE_PROVIDER_SITE_OTHER): Payer: Medicare Other | Admitting: Family Medicine

## 2018-01-03 VITALS — BP 110/68 | HR 95 | Temp 99.2°F | Resp 16 | Ht 59.06 in | Wt 145.0 lb

## 2018-01-03 DIAGNOSIS — Z136 Encounter for screening for cardiovascular disorders: Secondary | ICD-10-CM

## 2018-01-03 DIAGNOSIS — M8580 Other specified disorders of bone density and structure, unspecified site: Secondary | ICD-10-CM

## 2018-01-03 DIAGNOSIS — R5383 Other fatigue: Secondary | ICD-10-CM

## 2018-01-03 DIAGNOSIS — Z8249 Family history of ischemic heart disease and other diseases of the circulatory system: Secondary | ICD-10-CM | POA: Diagnosis not present

## 2018-01-03 DIAGNOSIS — F411 Generalized anxiety disorder: Secondary | ICD-10-CM | POA: Diagnosis not present

## 2018-01-03 NOTE — Progress Notes (Signed)
Subjective:  By signing my name below, I, Heather [name], attest that this documentation has been prepared under the direction and in the presence of Heather Ray, MD. Electronically Signed: Moises [name], [place]. 01/03/2018 , 10:27 AM .  Patient was seen in Room 10 .   Patient ID: Heather [name], female    DOB: 15-Apr-1961, 57 y.o.   MRN: [id number] Chief Complaint  Patient presents with  . Establish Care    pt needs to referral to [place]   . Medication Refill   HPI Heather [name] is a 57 y.o. female She is a patient of Heather [name], Heather, but is now transferring to me due to recent insurance requirements. She has a history of mental retardation, anxiety, arthritis, and osteopenia.   She is brought in today with Heather [name], a closer friend and the patient's primary caretaker.   Anxiety She currently takes Pristiq 50mg  QD and ritalin 10mg  QAM. Telephone note of Jan 3rd reviewed, with need for referral to [facility].   She has been going Administrator to see a therapist for medication prescription and counselor x2 a month. She's been taking Pristiq for a while now. She's been taking ritalin for ongoing fatigue.   Fatigue She also complains of fatigue ongoing for several years. She usually is able to stay awake throughout the day when she's staying busy with chores or activity. But when she's out of chores or activity, she can lay down and take a nap. She denies any chest tightness or chest pain with this fatigue. She had normal TSH in the past.   Lab Results  Component Value Date   TSH 2.05 06/15/2016   Family history: mother had heart disease.   Osteopenia Most recent DEXA scan Nov 2017, recommended calcium and Vitamin D supplements daily. T-score -2.2 left radius and -2.5 right femoral neck; plan for repeat in 2 years (Nov of this year). She's been taking calcium + vitamin D supplement.   Speech Therapy She informs having dentures in place  and sometimes having difficulty with speech with them. Will discuss this at future visit.   Patient Active Problem List   Diagnosis Date Noted  . Osteopenia 12/02/2016  . Scoliosis 06/15/2016  . BMI 30.0-30.9,adult 06/15/2016  . RIGHT knee fusion 06/15/2016  . Arthritis of knee 09/17/2014  . Arthritis 08/26/2014  . Anxiety state, unspecified 08/26/2014  . Migraine 08/26/2014  . Mental retardation 08/26/2014   Past Medical History:  Diagnosis Date  . Allergy   . Anxiety    history restored after error with record merge      . Arthritis    history restored after error with record merge      . Depression   . Fatty infiltration of liver    history restored after error with record merge      . Intestinal obstruction (HCC)    as an infant history restored after error with record merge      . Mental retardation    history restored after error with record merge       Past Surgical History:  Procedure Laterality Date  . ABDOMINAL HYSTERECTOMY    . COLONOSCOPY    . fused knee    . KNEE DISLOCATION SURGERY    . POLYPECTOMY    . SIGMOIDOSCOPY     No Known Allergies Prior to Admission medications   Medication Sig Start Date End Date Taking? Authorizing Provider  ALPRAZolam Duanne Moron) 0.25 MG tablet Take 1-2  tablets (0.25-0.5 mg total) by mouth 2 (two) times daily as needed for anxiety. Patient not taking: Reported on 05/04/2017 01/02/17   Heather Mons, PA-C  Desvenlafaxine Succinate ER 25 MG TB24 Take by mouth.    [provider]  triamcinolone cream (KENALOG) 0.1 % Apply thin layer to affected area TID PRN itching 05/04/17   Heather Mons, PA-C   Social History   Socioeconomic History  . Marital status: Single    Spouse name: n/a  . Number of children: 0  . Years of education: Not on file  . Highest education level: Not on file  Social Needs  . Financial resource strain: Not on file  . Food insecurity - worry: Not on file  . Food insecurity - inability: Not on file   . Transportation needs - medical: Not on file  . Transportation needs - non-medical: Not on file  Occupational History  . Occupation: [employer]    Employer: DISABLED  Tobacco Use  . Smoking status: Never Smoker  . Smokeless tobacco: Never Used  Substance and Sexual Activity  . Alcohol use: No    Alcohol/week: 0.0 oz  . Drug use: No  . Sexual activity: No  Other Topics Concern  . Not on file  Social History Narrative   Lives with Heather [name], a close friend of her mother, and [name]'s adult son.       Review of Systems  Constitutional: Positive for fatigue. Negative for unexpected weight change.  Respiratory: Negative for chest tightness and shortness of breath.   Cardiovascular: Negative for chest pain, palpitations and leg swelling.  Gastrointestinal: Negative for abdominal pain and [name] in stool.  Neurological: Negative for dizziness, syncope, light-headedness and headaches.       Objective:   Physical Exam  Constitutional: She is oriented to person, place, and time. She appears well-developed and well-nourished.  HENT:  Head: Normocephalic and atraumatic.  Eyes: Conjunctivae and EOM are normal. Pupils are equal, round, and reactive to light.  Neck: Carotid bruit is not present.  Cardiovascular: Normal rate, regular rhythm, normal heart sounds and intact distal pulses. Exam reveals no gallop and no friction rub.  No murmur heard. Pulmonary/Chest: Effort normal and breath sounds normal.  Abdominal: Soft. She exhibits no pulsatile midline mass. There is no tenderness.  Neurological: She is alert and oriented to person, place, and time.  Skin: Skin is warm and dry.  Psychiatric: She has a normal mood and affect. Her behavior is normal.  Vitals reviewed.   Vitals:   01/03/18 0953  BP: 110/68  Pulse: 95  Resp: 16  Temp: 99.2 F (37.3 C)  TempSrc: Oral  SpO2: 97%  Weight: 145 lb (65.8 kg)  Height: 4' 11.06" (1.5 m)   EKG: sinus rhythm rate 71, Qwave  noted in II, III and avF without apparent ST or Twave change; no acute findings otherwise; no prior EKG available for comparison.     Assessment & Plan:   Heather [name] is a 57 y.o. female Fatigue, unspecified type - Plan: Comprehensive metabolic panel, CBC, TSH, EKG 12-Lead Screening for cardiovascular condition Family history of cardiomyopathy - Plan: EKG 12-Lead  -Reports long-standing symptoms, may be multifactorial, including component of anxiety/depression, deconditioning. No apparent acute findings on EKG, questionable Q waves inferiorly without previous EKG for review. Denies any cardiac symptoms, but reported family history of cardiac disease in her mother.  -Check TSH, CBC, CMP, and follow-up in the next 3-4 weeks to discuss symptoms further. If persistent fatigue,  would consider cardiology evaluation to determine if stress testing indicated. RTC precautions if worsening.  Anxiety state - Plan: Ambulatory referral to Psychiatry  -Long-standing anxiety disorder, but apparently is also being treated with stimulant for possible attention deficit symptoms versus fatigue. Referral placed to previous psychiatrists/mental health provider. Option of temporary refill of current medications, but if she were to continue the stimulant, would need records from her provider.  Osteopenia, unspecified location  - Continue vitamin D, calcium, plan for repeat bone density later this year   No orders of the defined types were placed in this encounter.  Patient Instructions   No change in meds for now. I will check some tests for fatigue. Recommend follow up in next 3-4 weeks to discuss fatigue.   I will refer you to [facility] for ongoing psychiatric care with therapist and doctor, but if they are not in network, may need to look into other psychiatric facilities. If unable to see psychiatrist, I could possibly temporarily refill meds so you do not run out.   Make sure you are continuing  calcium and vitamin D each day, and plan for repeat bone density later this year.   Return to the clinic or go to the nearest emergency room if any of your symptoms worsen or new symptoms occur.   IF you received an x-[name] today, you will receive an invoice from [facility]. Please contact [facility] at [phone] with questions or concerns regarding your invoice.   IF you received labwork today, you will receive an invoice from [place]. Please contact [facility] at [phone] with questions or concerns regarding your invoice.   Our billing staff will not be able to assist you with questions regarding bills from these companies.  You will be contacted with the lab results as soon as they are available. The fastest way to get your results is to activate your My Chart account. Instructions are located on the last page of this paperwork. If you have not heard from [place] regarding the results in 2 weeks, please contact this office.       I personally performed the services described in this documentation, which was scribed in my presence. The recorded information has been reviewed and considered for accuracy and completeness, addended by me as needed, and agree with information above.  Signed,   Heather Ray, MD Primary Care at [place].  01/03/18 11:50 AM

## 2018-01-03 NOTE — Patient Instructions (Addendum)
No change in meds for now. I will check some tests for fatigue. Recommend follow up in next 3-4 weeks to discuss fatigue.   I will refer you to [facility] for ongoing psychiatric care with therapist and doctor, but if they are not in network, may need to look into other psychiatric facilities. If unable to see psychiatrist, I could possibly temporarily refill meds so you do not run out.   Make sure you are continuing calcium and vitamin D each day, and plan for repeat bone density later this year.   Return to the clinic or go to the nearest emergency room if any of your symptoms worsen or new symptoms occur.   IF you received an x-ray today, you will receive an invoice from [facility]. Please contact [facility] at [phone] with questions or concerns regarding your invoice.   IF you received labwork today, you will receive an invoice from [place]. Please contact [facility] at [phone] with questions or concerns regarding your invoice.   Our billing staff will not be able to assist you with questions regarding bills from these companies.  You will be contacted with the lab results as soon as they are available. The fastest way to get your results is to activate your My Chart account. Instructions are located on the last page of this paperwork. If you have not heard from [place] regarding the results in 2 weeks, please contact this office.

## 2018-01-04 ENCOUNTER — Telehealth: Payer: Self-pay | Admitting: Physician Assistant

## 2018-01-04 DIAGNOSIS — D696 Thrombocytopenia, unspecified: Secondary | ICD-10-CM

## 2018-01-04 LAB — COMPREHENSIVE METABOLIC PANEL
ALT: 9 IU/L (ref 0–32)
AST: 22 IU/L (ref 0–40)
Albumin/Globulin Ratio: 1.7 (ref 1.2–2.2)
Albumin: 3.9 g/dL (ref 3.5–5.5)
Alkaline Phosphatase: 106 IU/L (ref 39–117)
BUN/Creatinine Ratio: 21 (ref 9–23)
BUN: 19 mg/dL (ref 6–24)
Bilirubin Total: 0.2 mg/dL (ref 0.0–1.2)
CO2: 25 mmol/L (ref 20–29)
Calcium: 9.5 mg/dL (ref 8.7–10.2)
Chloride: 103 mmol/L (ref 96–106)
Creatinine, Ser: 0.89 mg/dL (ref 0.57–1.00)
GFR calc Af Amer: 83 mL/min/{1.73_m2} (ref >59)
GFR calc non Af Amer: 72 mL/min/{1.73_m2} (ref >59)
Globulin, Total: 2.3 g/dL (ref 1.5–4.5)
Glucose: 86 mg/dL (ref 65–99)
Potassium: 4.4 mmol/L (ref 3.5–5.2)
Sodium: 143 mmol/L (ref 134–144)
Total Protein: 6.2 g/dL (ref 6.0–8.5)

## 2018-01-04 LAB — CBC
Hematocrit: 39.8 % (ref 34.0–46.6)
Hemoglobin: 12.8 g/dL (ref 11.1–15.9)
MCH: 30.9 pg (ref 26.6–33.0)
MCHC: 32.2 g/dL (ref 31.5–35.7)
MCV: 96 fL (ref 79–97)
Platelets: 89 10*3/uL — CL (ref 150–379)
RBC: 4.14 x10E6/uL (ref 3.77–5.28)
RDW: 13.2 % (ref 12.3–15.4)
WBC: 3.6 10*3/uL (ref 3.4–10.8)

## 2018-01-04 LAB — TSH: TSH: 1.4 u[IU]/mL (ref 0.450–4.500)

## 2018-01-04 NOTE — Telephone Encounter (Signed)
Tina - [facility] to report: Platelet count- 89

## 2018-01-04 NOTE — Telephone Encounter (Signed)
Called spoke with Anitra to inform [place].

## 2018-01-05 NOTE — Telephone Encounter (Signed)
Noted - please call pt to return for lab only stat CBC Monday am. Lab ordered.

## 2018-01-07 NOTE — Telephone Encounter (Signed)
Left message to call back   See message below

## 2018-01-08 ENCOUNTER — Telehealth: Payer: Self-pay | Admitting: Family Medicine

## 2018-01-08 ENCOUNTER — Ambulatory Visit: Payer: Medicare Other | Admitting: Urgent Care

## 2018-01-08 DIAGNOSIS — D696 Thrombocytopenia, unspecified: Secondary | ICD-10-CM

## 2018-01-08 LAB — CBC
Hematocrit: 38.6 % (ref 34.0–46.6)
Hemoglobin: 13.1 g/dL (ref 11.1–15.9)
MCH: 31 pg (ref 26.6–33.0)
MCHC: 33.9 g/dL (ref 31.5–35.7)
MCV: 91 fL (ref 79–97)
Platelets: 69 10*3/uL — CL (ref 150–379)
RBC: 4.23 x10E6/uL (ref 3.77–5.28)
RDW: 12.7 % (ref 12.3–15.4)
WBC: 3.2 10*3/uL — ABNORMAL LOW (ref 3.4–10.8)

## 2018-01-08 MED ORDER — DESVENLAFAXINE SUCCINATE ER 50 MG PO TB24: 50.0000 mg | ORAL_TABLET | Freq: Every day | ORAL | 0 refills | Status: DC

## 2018-01-08 MED ORDER — METHYLPHENIDATE HCL 10 MG PO TABS: 10.0000 mg | ORAL_TABLET | Freq: Every morning | ORAL | 0 refills | Status: DC

## 2018-01-08 NOTE — Telephone Encounter (Signed)
Per Dr. Carlota Raspberry, arranged Hematology consult with Hematologist on call, Dr. Alvy Bimler. Per Dr. Carlota Raspberry, patient is to follow up with Dr. Alvy Bimler on Thursday instead of Dr. Carlota Raspberry. Dr. Calton Dach office will contact patient to schedule.  Phone call to patient. Spoke with [name], patient's caregiver. Discussed the above with [facility]. She verbalizes understanding. If she can't be reached at her home number, recommends she be contacted at [phone] to schedule.

## 2018-01-08 NOTE — Telephone Encounter (Signed)
Here for repeat CBC. Also noted that she is out of her Ritalin and Pristiq, some difficulty with obtaining appointment with previous psychiatric care provider. Looking into options. We'll refill both medications temporarily until further plan discussed next visit

## 2018-01-08 NOTE — Progress Notes (Signed)
Nurse visit, I did not examine this patient.

## 2018-01-09 ENCOUNTER — Telehealth: Payer: Self-pay | Admitting: Hematology and Oncology

## 2018-01-09 NOTE — Telephone Encounter (Signed)
Spoke with patients caregiver regarding appointment 01/10/18 @ 10:15 per staff message from [facility]

## 2018-01-10 ENCOUNTER — Inpatient Hospital Stay: Payer: Medicare Other

## 2018-01-10 ENCOUNTER — Encounter: Payer: Self-pay | Admitting: Hematology and Oncology

## 2018-01-10 ENCOUNTER — Inpatient Hospital Stay: Payer: Medicare Other | Attending: Hematology and Oncology | Admitting: Hematology and Oncology

## 2018-01-10 ENCOUNTER — Telehealth: Payer: Self-pay | Admitting: Hematology and Oncology

## 2018-01-10 DIAGNOSIS — D61818 Other pancytopenia: Secondary | ICD-10-CM

## 2018-01-10 DIAGNOSIS — K760 Fatty (change of) liver, not elsewhere classified: Secondary | ICD-10-CM

## 2018-01-10 DIAGNOSIS — D696 Thrombocytopenia, unspecified: Secondary | ICD-10-CM | POA: Diagnosis not present

## 2018-01-10 DIAGNOSIS — R161 Splenomegaly, not elsewhere classified: Secondary | ICD-10-CM

## 2018-01-10 DIAGNOSIS — F70 Mild intellectual disabilities: Secondary | ICD-10-CM | POA: Insufficient documentation

## 2018-01-10 DIAGNOSIS — D72819 Decreased white blood cell count, unspecified: Secondary | ICD-10-CM | POA: Insufficient documentation

## 2018-01-10 LAB — CBC WITH DIFFERENTIAL/PLATELET
Basophils Absolute: 0 10*3/uL (ref 0.0–0.1)
Basophils Relative: 0 %
Eosinophils Absolute: 0 10*3/uL (ref 0.0–0.5)
Eosinophils Relative: 0 %
HCT: 39.6 % (ref 34.8–46.6)
Hemoglobin: 12.9 g/dL (ref 11.6–15.9)
Lymphocytes Relative: 32 %
Lymphs Abs: 1.2 10*3/uL (ref 0.9–3.3)
MCH: 31.1 pg (ref 25.1–34.0)
MCHC: 32.6 g/dL (ref 31.5–36.0)
MCV: 95.4 fL (ref 79.5–101.0)
Monocytes Absolute: 0.2 10*3/uL (ref 0.1–0.9)
Monocytes Relative: 6 %
Neutro Abs: 2.4 10*3/uL (ref 1.5–6.5)
Neutrophils Relative %: 62 %
Platelets: 76 10*3/uL — ABNORMAL LOW (ref 145–400)
RBC: 4.15 MIL/uL (ref 3.70–5.45)
RDW: 12.9 % (ref 11.2–16.1)
WBC: 3.9 10*3/uL (ref 3.9–10.3)

## 2018-01-10 LAB — IRON AND TIBC
Iron: 57 ug/dL (ref 41–142)
Saturation Ratios: 18 % — ABNORMAL LOW (ref 21–57)
TIBC: 316 ug/dL (ref 236–444)
UIBC: 260 ug/dL

## 2018-01-10 LAB — LACTATE DEHYDROGENASE: LDH: 164 U/L (ref 125–245)

## 2018-01-10 LAB — VITAMIN B12: Vitamin B-12: 254 pg/mL (ref 180–914)

## 2018-01-10 LAB — FERRITIN: Ferritin: 82 ng/mL (ref 9–269)

## 2018-01-10 LAB — FIBRINOGEN: Fibrinogen: 469 mg/dL (ref 210–475)

## 2018-01-10 LAB — SEDIMENTATION RATE: Sed Rate: 11 mm/hr (ref 0–22)

## 2018-01-10 LAB — SAVE SMEAR

## 2018-01-10 NOTE — Telephone Encounter (Signed)
Gave patient AVS and calendar of upcoming January appointments °

## 2018-01-11 ENCOUNTER — Encounter: Payer: Self-pay | Admitting: Hematology and Oncology

## 2018-01-11 LAB — HEPATITIS B CORE ANTIBODY, IGM: Hep B C IgM: NEGATIVE

## 2018-01-11 LAB — HEPATITIS B SURFACE ANTIBODY,QUALITATIVE: Hep B S Ab: NONREACTIVE

## 2018-01-11 LAB — HEPATITIS B SURFACE ANTIGEN: Hepatitis B Surface Ag: NEGATIVE

## 2018-01-11 NOTE — Progress Notes (Signed)
[facility] NOTE  Patient Care Team: [name], MD as PCP - General (Family Medicine) Calvert Cantor, MD as Consulting Physician (Ophthalmology)  CHIEF COMPLAINTS/PURPOSE OF CONSULTATION:  Acute thrombocytopenia  HISTORY OF PRESENTING ILLNESS:  Heather [name] 57 y.o. female is here because of thrombocytopenia. She has mild mental retardation. Her caregiver, [name] is with her. Her background history is not clear. She lives with [name] for 4 years.  She was found to have abnormal CBC from recent blood work. She has normal CBC as of last year. Most recently, repeat CBC show mild leukopenia with a platelet count of 89,000.  That was subsequently repeated and it got even lower to 69,000. She had discovered recent bruising but denies spontaneous epistaxis, hematuria, melena or hematochezia The patient denies history of liver disease, exposure to heparin, history of cardiac murmur/prior cardiovascular surgery or recent new medications She had received blood transfusion in the past. Although the patient denies a history of liver disease, ultrasound of the abdomen from April 2012 revealed fatty infiltration of the liver, suspicious for possible fatty liver disease versus other liver condition. She denies recent new medications.  She denies recent infection.  MEDICAL HISTORY:  Past Medical History:  Diagnosis Date  . Allergy   . Anxiety    history restored after error with record merge      . Arthritis    history restored after error with record merge      . Depression   . Fatty infiltration of liver    history restored after error with record merge      . Intestinal obstruction (HCC)    as an infant history restored after error with record merge      . Mental retardation    history restored after error with record merge        SURGICAL HISTORY: Past Surgical History:  Procedure Laterality Date  . ABDOMINAL HYSTERECTOMY    . COLONOSCOPY    . fused  knee    . KNEE DISLOCATION SURGERY    . POLYPECTOMY    . SIGMOIDOSCOPY      SOCIAL HISTORY: Social History   Socioeconomic History  . Marital status: Single    Spouse name: n/a  . Number of children: 0  . Years of education: Not on file  . Highest education level: Not on file  Social Needs  . Financial resource strain: Not on file  . Food insecurity - worry: Not on file  . Food insecurity - inability: Not on file  . Transportation needs - medical: Not on file  . Transportation needs - non-medical: Not on file  Occupational History  . Occupation: [employer]    Employer: DISABLED  Tobacco Use  . Smoking status: Never Smoker  . Smokeless tobacco: Never Used  Substance and Sexual Activity  . Alcohol use: No    Alcohol/week: 0.0 oz  . Drug use: No  . Sexual activity: No  Other Topics Concern  . Not on file  Social History Narrative   Lives with [name], a close friend of her mother, and [name]'s adult son.        FAMILY HISTORY: Family History  Problem Relation Age of Onset  . Diabetes Mother        history restored after error with record merge      . Colon cancer Neg Hx     ALLERGIES:  has No Known Allergies.  MEDICATIONS:  Current Outpatient Medications  Medication Sig  Dispense Refill  . desvenlafaxine (PRISTIQ) 50 MG 24 hr tablet Take 1 tablet (50 mg total) by mouth daily. 30 tablet 0  . methylphenidate (RITALIN) 10 MG tablet Take 1 tablet (10 mg total) by mouth every morning. 30 tablet 0  . triamcinolone cream (KENALOG) 0.1 % Apply thin layer to affected area TID PRN itching 30 g 0   No current facility-administered medications for this visit.     REVIEW OF SYSTEMS:   Constitutional: Denies fevers, chills or abnormal night sweats Eyes: Denies blurriness of vision, double vision or watery eyes Ears, nose, mouth, throat, and face: Denies mucositis or sore throat Respiratory: Denies cough, dyspnea or wheezes Cardiovascular: Denies palpitation,  chest discomfort or lower extremity swelling Gastrointestinal:  Denies nausea, heartburn or change in bowel habits Skin: Denies abnormal skin rashes Lymphatics: Denies new lymphadenopathy  Neurological:Denies numbness, tingling or new weaknesses Behavioral/Psych: Mood is stable, no new changes  All other systems were reviewed with the patient and are negative.  PHYSICAL EXAMINATION: ECOG PERFORMANCE STATUS: 1 - Symptomatic but completely ambulatory  Vitals:   01/10/18 1040  BP: 127/77  Pulse: 75  Resp: 18  Temp: 98.5 F (36.9 C)  SpO2: 100%   Filed Weights   01/10/18 1040  Weight: 143 lb (64.9 kg)    GENERAL:alert, no distress and comfortable SKIN: skin color, texture, turgor are normal, no rashes or significant lesions EYES: normal, conjunctiva are pink and non-injected, sclera clear OROPHARYNX:no exudate, no erythema and lips, buccal mucosa, and tongue normal  NECK: supple, thyroid normal size, non-tender, without nodularity LYMPH:  no palpable lymphadenopathy in the cervical, axillary or inguinal LUNGS: clear to auscultation and percussion with normal breathing effort HEART: regular rate & rhythm and no murmurs and no lower extremity edema ABDOMEN:abdomen soft, non-tender and normal bowel sounds.  I can feel the tip of the spleen on exam Musculoskeletal:no cyanosis of digits and no clubbing. She is not able to flex her right knee  PSYCH: alert & oriented x 3 with fluent speech, noted mental deficit NEURO: no focal motor/sensory deficits  LABORATORY DATA:  I have reviewed the data as listed CBC Latest Ref Rng & Units 01/10/2018 01/08/2018 01/03/2018  WBC 3.9 - 10.3 K/uL 3.9 3.2(L) 3.6  Hemoglobin 11.6 - 15.9 g/dL 12.9 13.1 12.8  Hematocrit 34.8 - 46.6 % 39.6 38.6 39.8  Platelets 145 - 400 K/uL 76(L) 69(LL) 89(LL)    ASSESSMENT & PLAN Pancytopenia, acquired ([place]) She has mild intermittent leukopenia and thrombocytopenia which are new With possible splenomegaly on exam,  I recommend CT scan of the abdomen and pelvis for further evaluation I will also additional workup to exclude autoimmune disease, undiagnosed viral infection or nutritional deficiency We will see her back in 2 weeks to review test results As long as her platelet count remained over 50,000, she does not need  platelet transfusion I educated the patient and caregiver signs and symptoms to watch out for possible worsening thrombocytopenia  Splenomegaly I will reassess with imaging study  Fatty liver disease, nonalcoholic The patient had history of abnormal liver imaging I will order hepatitis panel to exclude other form of liver disease     Orders Placed This Encounter  Procedures  . CT ABDOMEN PELVIS W CONTRAST    Standing Status:   Future    Standing Expiration Date:   01/10/2019    Order Specific Question:   If indicated for the ordered procedure, I authorize the administration of contrast media per Radiology protocol  Answer:   Yes    Order Specific Question:   Preferred imaging location?    Answer:   [facility]    Order Specific Question:   Call Results- Best Contact Number?    Answer:   [email or web address]=    Order Specific Question:   Radiology Contrast Protocol - do NOT remove file path    Answer:   \\[email or web address]    Order Specific Question:   Is patient pregnant?    Answer:   No  . CBC with Differential/Platelet    Standing Status:   Future    Number of Occurrences:   1    Standing Expiration Date:   02/14/2019  . Sedimentation rate    Standing Status:   Future    Number of Occurrences:   1    Standing Expiration Date:   02/14/2019  . Iron and TIBC    Standing Status:   Future    Number of Occurrences:   1    Standing Expiration Date:   02/14/2019  . Ferritin    Standing Status:   Future    Number of Occurrences:   1    Standing Expiration Date:   02/14/2019  . Vitamin B12    Standing Status:   Future    Number of Occurrences:   1     Standing Expiration Date:   02/14/2019  . Lactate dehydrogenase    Standing Status:   Future    Number of Occurrences:   1    Standing Expiration Date:   01/10/2019  . Hepatitis B surface antibody    Standing Status:   Future    Number of Occurrences:   1    Standing Expiration Date:   02/14/2019  . Hepatitis B surface antigen    Standing Status:   Future    Number of Occurrences:   1    Standing Expiration Date:   02/14/2019  . Hepatitis B core antibody, IgM    Standing Status:   Future    Number of Occurrences:   1    Standing Expiration Date:   02/14/2019  . ANA, IFA (with reflex)    Standing Status:   Future    Number of Occurrences:   1    Standing Expiration Date:   01/10/2019  . Save smear    Standing Status:   Future    Number of Occurrences:   1    Standing Expiration Date:   01/10/2019  . Fibrinogen    Standing Status:   Future    Number of Occurrences:   1    Standing Expiration Date:   02/14/2019   All questions were answered. The patient knows to call the clinic with any problems, questions or concerns. No barriers to learning was detected. I spent 45 minutes counseling the patient face to face. The total time spent in the appointment was 60 minutes and more than 50% was on counseling and review of test results     [name], MD 01/11/2018 6:30 PM

## 2018-01-11 NOTE — Assessment & Plan Note (Signed)
I will reassess with imaging study

## 2018-01-11 NOTE — Assessment & Plan Note (Signed)
She has mild intermittent leukopenia and thrombocytopenia which are new With possible splenomegaly on exam, I recommend CT scan of the abdomen and pelvis for further evaluation I will also additional workup to exclude autoimmune disease, undiagnosed viral infection or nutritional deficiency We will see her back in 2 weeks to review test results As long as her platelet count remained over 50,000, she does not need  platelet transfusion I educated the patient and caregiver signs and symptoms to watch out for possible worsening thrombocytopenia

## 2018-01-11 NOTE — Assessment & Plan Note (Signed)
The patient had history of abnormal liver imaging I will order hepatitis panel to exclude other form of liver disease

## 2018-01-15 LAB — ANTINUCLEAR ANTIBODIES, IFA: ANA Ab, IFA: POSITIVE — AB

## 2018-01-15 LAB — FANA STAINING PATTERNS: Speckled Pattern: 1:160 {titer} — ABNORMAL HIGH

## 2018-01-17 ENCOUNTER — Ambulatory Visit (HOSPITAL_COMMUNITY)
Admission: RE | Admit: 2018-01-17 | Discharge: 2018-01-17 | Disposition: A | Discharge status: Home / Self Care | Payer: Medicare Other | Source: Ambulatory Visit | Attending: Hematology and Oncology | Admitting: Hematology and Oncology

## 2018-01-17 ENCOUNTER — Encounter (HOSPITAL_COMMUNITY): Payer: Self-pay

## 2018-01-17 DIAGNOSIS — K7689 Other specified diseases of liver: Secondary | ICD-10-CM | POA: Diagnosis not present

## 2018-01-17 DIAGNOSIS — K5730 Diverticulosis of large intestine without perforation or abscess without bleeding: Secondary | ICD-10-CM | POA: Diagnosis not present

## 2018-01-17 DIAGNOSIS — N200 Calculus of kidney: Secondary | ICD-10-CM | POA: Insufficient documentation

## 2018-01-17 DIAGNOSIS — R109 Unspecified abdominal pain: Secondary | ICD-10-CM | POA: Diagnosis not present

## 2018-01-17 DIAGNOSIS — K760 Fatty (change of) liver, not elsewhere classified: Secondary | ICD-10-CM | POA: Insufficient documentation

## 2018-01-17 MED ORDER — IOPAMIDOL (ISOVUE-300) INJECTION 61%
100.0000 mL | Freq: Once | INTRAVENOUS | Status: AC | PRN
  Administered 2018-01-17: 100 mL via INTRAVENOUS

## 2018-01-17 MED ORDER — IOPAMIDOL (ISOVUE-300) INJECTION 61%
INTRAVENOUS | Status: DC
Start: 2018-01-17 — End: 2018-01-18
  Filled 2018-01-17: qty 100

## 2018-01-24 ENCOUNTER — Telehealth: Payer: Self-pay | Admitting: Hematology and Oncology

## 2018-01-24 ENCOUNTER — Encounter: Payer: Self-pay | Admitting: Hematology and Oncology

## 2018-01-24 ENCOUNTER — Ambulatory Visit: Payer: Medicare Other | Admitting: Family Medicine

## 2018-01-24 ENCOUNTER — Inpatient Hospital Stay (HOSPITAL_BASED_OUTPATIENT_CLINIC_OR_DEPARTMENT_OTHER): Payer: Medicare Other | Admitting: Hematology and Oncology

## 2018-01-24 VITALS — BP 117/69 | HR 79 | Temp 98.7°F | Resp 18 | Ht 59.0 in | Wt 143.4 lb

## 2018-01-24 DIAGNOSIS — D696 Thrombocytopenia, unspecified: Secondary | ICD-10-CM | POA: Diagnosis not present

## 2018-01-24 DIAGNOSIS — D72819 Decreased white blood cell count, unspecified: Secondary | ICD-10-CM

## 2018-01-24 DIAGNOSIS — F70 Mild intellectual disabilities: Secondary | ICD-10-CM

## 2018-01-24 DIAGNOSIS — D693 Immune thrombocytopenic purpura: Secondary | ICD-10-CM

## 2018-01-24 DIAGNOSIS — K760 Fatty (change of) liver, not elsewhere classified: Secondary | ICD-10-CM | POA: Diagnosis not present

## 2018-01-24 DIAGNOSIS — R768 Other specified abnormal immunological findings in serum: Secondary | ICD-10-CM

## 2018-01-24 DIAGNOSIS — K769 Liver disease, unspecified: Secondary | ICD-10-CM | POA: Insufficient documentation

## 2018-01-24 NOTE — Assessment & Plan Note (Signed)
I have reviewed the imaging study with her caregiver She will attempt dietary modification and weight loss strategy

## 2018-01-24 NOTE — Assessment & Plan Note (Addendum)
Her overall presentation likely due to acute ITP, given positive autoimmune screen She is not symptomatic I recommend observation only She does not need treatment unless it starts to drift closer to 50,000 Liver disease can also contribute to thrombocytopenia and I advised her to modify her diet and attempt weight loss She is also noted to have borderline low B12 level and I recommend over-the-counter B12 supplement I plan to see her back in 3 months for further follow-up and repeat blood test The patient and her caregiver has been advised to watch out for signs and symptoms of bleeding

## 2018-01-24 NOTE — Assessment & Plan Note (Signed)
She tested positive with ANA screen The patient have chronic arthritis I recommend rheumatology consultation for further evaluation It is also possible that autoimmune disease can contribute to ITP

## 2018-01-24 NOTE — Telephone Encounter (Signed)
Gave patient AVS and calendar of upcoming April appointments.  °

## 2018-01-24 NOTE — Assessment & Plan Note (Signed)
The cause of the liver lesion is unknown According to CT report dated back to 2012, she had 1 cm lesion noted from an outside scan From the description of the current CT scan, the larger lesion measured 2 cm in size I recommend we pursue MRI of the liver for further evaluation and she agreed to proceed

## 2018-01-24 NOTE — Progress Notes (Signed)
[place] OFFICE PROGRESS NOTE  [name], MD SUMMARY OF HEMATOLOGIC HISTORY:  Heather [name] 57 y.o. female is here because of thrombocytopenia. She has mild mental retardation. Her caregiver, [name] is with her. Her background history is not clear. She lives with [name] for 4 years.  She was found to have abnormal CBC from recent blood work. She has normal CBC as of last year. Most recently, repeat CBC show mild leukopenia with a platelet count of 89,000.  That was subsequently repeated and it got even lower to 69,000. She had discovered recent bruising but denies spontaneous epistaxis, hematuria, melena or hematochezia The patient denies history of liver disease, exposure to heparin, history of cardiac murmur/prior cardiovascular surgery or recent new medications She had received blood transfusion in the past. Although the patient denies a history of liver disease, ultrasound of the abdomen from April 2012 revealed fatty infiltration of the liver, suspicious for possible fatty liver disease versus other liver condition. She denies recent new medications.  She denies recent infection. Further workup revealed evidence of liver lesion on CT imaging and positive ANA screen.  INTERVAL HISTORY: Heather [name] 57 y.o. female returns for further follow-up. She has no new complaints. The patient denies any recent signs or symptoms of bleeding such as spontaneous epistaxis, hematuria or hematochezia.   I have reviewed the past medical history, past surgical history, social history and family history with the patient and they are unchanged from previous note.  ALLERGIES:  has No Known Allergies.  MEDICATIONS:  Current Outpatient Medications  Medication Sig Dispense Refill  . desvenlafaxine (PRISTIQ) 50 MG 24 hr tablet Take 1 tablet (50 mg total) by mouth daily. 30 tablet 0  . methylphenidate (RITALIN) 10 MG tablet Take 1 tablet (10 mg total) by mouth every  morning. 30 tablet 0  . triamcinolone cream (KENALOG) 0.1 % Apply thin layer to affected area TID PRN itching 30 g 0   No current facility-administered medications for this visit.      REVIEW OF SYSTEMS:   Constitutional: Denies fevers, chills or night sweats Eyes: Denies blurriness of vision Ears, nose, mouth, throat, and face: Denies mucositis or sore throat Respiratory: Denies cough, dyspnea or wheezes Cardiovascular: Denies palpitation, chest discomfort or lower extremity swelling Gastrointestinal:  Denies nausea, heartburn or change in bowel habits Skin: Denies abnormal skin rashes Lymphatics: Denies new lymphadenopathy or easy bruising Neurological:Denies numbness, tingling or new weaknesses Behavioral/Psych: Mood is stable, no new changes  All other systems were reviewed with the patient and are negative.  PHYSICAL EXAMINATION: ECOG PERFORMANCE STATUS: 0 - Asymptomatic  Vitals:   01/24/18 1522  BP: 117/69  Pulse: 79  Resp: 18  Temp: 98.7 F (37.1 C)  SpO2: 99%   Filed Weights   01/24/18 1522  Weight: 143 lb 6.4 oz (65 kg)    GENERAL:alert, no distress and comfortable SKIN: skin color, texture, turgor are normal, no rashes or significant lesions NEURO: alert & oriented x 3 with fluent speech, no focal motor/sensory deficits  LABORATORY DATA:  I have reviewed the data as listed     Component Value Date/Time   NA 143 01/03/2018 1108   K 4.4 01/03/2018 1108   CL 103 01/03/2018 1108   CO2 25 01/03/2018 1108   GLUCOSE 86 01/03/2018 1108   GLUCOSE 88 08/22/2014 1031   BUN 19 01/03/2018 1108   CREATININE 0.89 01/03/2018 1108   CREATININE 0.85 08/22/2014 1031   CALCIUM 9.5 01/03/2018 1108  PROT 6.2 01/03/2018 1108   ALBUMIN 3.9 01/03/2018 1108   AST 22 01/03/2018 1108   ALT 9 01/03/2018 1108   ALKPHOS 106 01/03/2018 1108   BILITOT 0.2 01/03/2018 1108   GFRNONAA 72 01/03/2018 1108   GFRAA 83 01/03/2018 1108    No results found for: SPEP, UPEP  Lab  Results  Component Value Date   WBC 3.9 01/10/2018   NEUTROABS 2.4 01/10/2018   HGB 12.9 01/10/2018   HCT 39.6 01/10/2018   MCV 95.4 01/10/2018   PLT 76 (L) 01/10/2018      Chemistry      Component Value Date/Time   NA 143 01/03/2018 1108   K 4.4 01/03/2018 1108   CL 103 01/03/2018 1108   CO2 25 01/03/2018 1108   BUN 19 01/03/2018 1108   CREATININE 0.89 01/03/2018 1108   CREATININE 0.85 08/22/2014 1031      Component Value Date/Time   CALCIUM 9.5 01/03/2018 1108   ALKPHOS 106 01/03/2018 1108   AST 22 01/03/2018 1108   ALT 9 01/03/2018 1108   BILITOT 0.2 01/03/2018 1108       RADIOGRAPHIC STUDIES: I have reviewed her recent CT imaging in great detail with the patient and caregiver I have personally reviewed the radiological images as listed and agreed with the findings in the report.   ASSESSMENT & PLAN:  Acute ITP ([place]) Her overall presentation likely due to acute ITP, given positive autoimmune screen She is not symptomatic I recommend observation only She does not need treatment unless it starts to drift closer to 50,000 Liver disease can also contribute to thrombocytopenia and I advised her to modify her diet and attempt weight loss She is also noted to have borderline low B12 level and I recommend over-the-counter B12 supplement I plan to see her back in 3 months for further follow-up and repeat blood test The patient and her caregiver has been advised to watch out for signs and symptoms of bleeding  Fatty liver disease, nonalcoholic I have reviewed the imaging study with her caregiver She will attempt dietary modification and weight loss strategy  Lesion of liver The cause of the liver lesion is unknown According to CT report dated back to 2012, she had 1 cm lesion noted from an outside scan From the description of the current CT scan, the larger lesion measured 2 cm in size I recommend we pursue MRI of the liver for further evaluation and she agreed to  proceed  Positive ANA (antinuclear antibody) She tested positive with ANA screen The patient have chronic arthritis I recommend rheumatology consultation for further evaluation It is also possible that autoimmune disease can contribute to ITP   Orders Placed This Encounter  Procedures  . MR LIVER W WO CONTRAST    Standing Status:   Future    Standing Expiration Date:   03/25/2019    Order Specific Question:   If indicated for the ordered procedure, I authorize the administration of contrast media per Radiology protocol    Answer:   Yes    Order Specific Question:   What is the patient's sedation requirement?    Answer:   No Sedation    Order Specific Question:   Does the patient have a pacemaker or implanted devices?    Answer:   No    Order Specific Question:   Radiology Contrast Protocol - do NOT remove file path    Answer:   \\[email or web address]\[email or web address]    Order Specific Question:   Reason  for Exam additional comments    Answer:   liver lesion seen on CT.    Order Specific Question:   Preferred imaging location?    Answer:   [facility] (table limit-350 lbs)  . Ambulatory referral to Rheumatology    Referral Priority:   Routine    Referral Type:   Consultation    Referral Reason:   Specialty Services Required    Requested Specialty:   Rheumatology    Number of Visits Requested:   1    All questions were answered. The patient knows to call the clinic with any problems, questions or concerns. No barriers to learning was detected.  I spent 25 minutes counseling the patient face to face. The total time spent in the appointment was 30 minutes and more than 50% was on counseling.     [name], MD 1/31/20194:06 PM

## 2018-01-25 ENCOUNTER — Telehealth: Payer: Self-pay

## 2018-01-25 NOTE — Telephone Encounter (Signed)
Referral called and faxed to [phone].

## 2018-02-07 ENCOUNTER — Other Ambulatory Visit: Payer: Self-pay

## 2018-02-07 MED ORDER — METHYLPHENIDATE HCL 10 MG PO TABS: 10.0000 mg | ORAL_TABLET | Freq: Every morning | ORAL | 0 refills | Status: DC

## 2018-02-07 MED ORDER — DESVENLAFAXINE SUCCINATE ER 50 MG PO TB24: 50.0000 mg | ORAL_TABLET | Freq: Every day | ORAL | 0 refills | Status: DC

## 2018-02-07 NOTE — Telephone Encounter (Signed)
Refilled for 1 month, but please check into status of psychiatric follow up.  If unable to see prior provider, I will need records to continue Rx for Ritalin. Thanks.

## 2018-02-07 NOTE — Telephone Encounter (Signed)
Patient is requesting a refill of the following medications: Requested Prescriptions   Pending Prescriptions Disp Refills  . desvenlafaxine (PRISTIQ) 50 MG 24 hr tablet 30 tablet 0    Sig: Take 1 tablet (50 mg total) by mouth daily.  . methylphenidate (RITALIN) 10 MG tablet 30 tablet 0    Sig: Take 1 tablet (10 mg total) by mouth every morning.    Date of patient request: 02/07/18 Last office visit: 01/03/2018 Date of last refill: 01/03/2018 Last refill amount: #30, no refills Follow up time period per chart: plan at "Anxiety state - Plan: Ambulatory referral to Psychiatry             -Long-standing anxiety disorder, but apparently is also being treated with stimulant for possible attention deficit symptoms versus fatigue. Referral placed to previous psychiatrists/mental health provider. Option of temporary refill of current medications, but if she were to continue the stimulant, would need records from her provider"  Records sent to [facility] for psych referral on 01/08/2018.

## 2018-02-07 NOTE — Telephone Encounter (Signed)
Copied from [place] [id number]. Topic: Quick Communication - Rx Refill/Question >> Feb 07, 2018 11:50 AM Antonieta Iba C wrote: Medication: methylphenidate (RITALIN) 10 MG tablet and also Desvenlafaxine (PRISTIQ) 50 MG 24 hr tablet    Has the patient contacted their pharmacy? Yes    (Agent: If no, request that the patient contact the pharmacy for the refill.)   Preferred Pharmacy (with phone number or street name): [place], [place]: Please be advised that RX refills may take up to 3 business days. We ask that you follow-up with your pharmacy.

## 2018-02-20 ENCOUNTER — Telehealth: Payer: Self-pay | Admitting: Family Medicine

## 2018-02-20 NOTE — Telephone Encounter (Signed)
Copied from [place] #[id number]. Topic: Referral - Status >> Feb 20, 2018 10:18 AM Cecelia Byars, NT wrote: Reason for CRM: Patient  [name]  called and to check on the status of  a referral  she is the patients caretaker  [phone]

## 2018-02-20 NOTE — Telephone Encounter (Signed)
Left vm for [name], pt's caregiver, on cell phone letting her know we have switched the psychiatry referral to neuropsychiatric care center. [facility] did receive the referral as I spoke with them this morning, however, they only have one provider who can see [facility] pt's and that would be a female. Pt was trying to be seen by female. I left [name] the location and phone number for neuropsychiatric. If they decide they would like to schedule with [facility] still, they can call their office and make the appt.

## 2018-02-21 ENCOUNTER — Telehealth: Payer: Self-pay

## 2018-02-21 ENCOUNTER — Ambulatory Visit (HOSPITAL_COMMUNITY)
Admission: RE | Admit: 2018-02-21 | Discharge: 2018-02-21 | Disposition: A | Discharge status: Home / Self Care | Payer: Medicare Other | Source: Ambulatory Visit | Attending: Hematology and Oncology | Admitting: Hematology and Oncology

## 2018-02-21 DIAGNOSIS — K769 Liver disease, unspecified: Secondary | ICD-10-CM

## 2018-02-21 DIAGNOSIS — K7689 Other specified diseases of liver: Secondary | ICD-10-CM | POA: Insufficient documentation

## 2018-02-21 DIAGNOSIS — C220 Liver cell carcinoma: Secondary | ICD-10-CM | POA: Diagnosis not present

## 2018-02-21 MED ORDER — GADOBENATE DIMEGLUMINE 529 MG/ML IV SOLN
15.0000 mL | Freq: Once | INTRAVENOUS | Status: AC | PRN
  Administered 2018-02-21: 13 mL via INTRAVENOUS

## 2018-02-21 NOTE — Telephone Encounter (Signed)
-----   Message from [name], MD sent at 02/21/2018 11:59 AM EST ----- Regarding: normal MRI Pls tell [name] her caregiver that MRI showed simple cysts ----- Message ----- From: Interface, Rad Results In Sent: 02/21/2018  11:55 AM To: [name], MD

## 2018-02-21 NOTE — Telephone Encounter (Signed)
Talked to Heather [name] about pt referral read pt the notes where we sent pt to neuropsychiatric care center and pt would like a woman psychiatrist.  Hanley Seamen pt neuropsychiatric care center number and told her to call them and make an appt for pt.

## 2018-02-21 NOTE — Telephone Encounter (Signed)
Called with below. Instructed to call for questions.

## 2018-03-07 ENCOUNTER — Other Ambulatory Visit: Payer: Self-pay | Admitting: Family Medicine

## 2018-03-07 NOTE — Telephone Encounter (Signed)
Copied from [place] [phone]. Topic: Quick Communication - Rx Refill/Question >> Mar 07, 2018  3:58 PM Oliver Pila B wrote: Medication:  methylphenidate (RITALIN) 10 MG tablet [[id number]] ,  desvenlafaxine (PRISTIQ) 50 MG 24 hr tablet [[id number]]    Has the patient contacted their pharmacy? Yes.     (Agent: If no, request that the patient contact the pharmacy for the refill.)   Preferred Pharmacy (with phone number [facility] street [facility]): [facility]   Agent: Please be advised that RX refills may take up to 3 business days. We ask that you follow-up with your pharmacy.

## 2018-03-08 NOTE — Telephone Encounter (Signed)
Refill of Ritalin and Pristiq  LOV 01/03/18  Dr. Carlota Raspberry  [facility]

## 2018-03-11 ENCOUNTER — Telehealth: Payer: Self-pay | Admitting: Family Medicine

## 2018-03-11 ENCOUNTER — Other Ambulatory Visit: Payer: Self-pay | Admitting: Family Medicine

## 2018-03-11 NOTE — Telephone Encounter (Signed)
Copied from [place] [id number]. Topic: General - Other >> Mar 11, 2018 10:03 AM Darl Householder, RMA wrote: Reason for CRM: Medication refill request desvenlafaxine (PRISTIQ) 50 MG 24 hr and  methylphenidate (RITALIN) 10 MG to be sent to [facility] [address]

## 2018-03-11 NOTE — Telephone Encounter (Signed)
Prescriptions are already pended for provider to review.

## 2018-03-12 MED ORDER — METHYLPHENIDATE HCL 10 MG PO TABS: 10.0000 mg | ORAL_TABLET | Freq: Every morning | ORAL | 0 refills | Status: DC

## 2018-03-12 MED ORDER — DESVENLAFAXINE SUCCINATE ER 50 MG PO TB24: 50.0000 mg | ORAL_TABLET | Freq: Every day | ORAL | 0 refills | Status: DC

## 2018-03-12 NOTE — Telephone Encounter (Signed)
It appears we provided number for psychiatrist few weeks ago.  Refilled meds, but will need to know update on appt by next refill.  Thanks.

## 2018-03-26 ENCOUNTER — Encounter: Payer: Self-pay | Admitting: Physician Assistant

## 2018-04-17 ENCOUNTER — Other Ambulatory Visit: Payer: Self-pay

## 2018-04-17 DIAGNOSIS — D693 Immune thrombocytopenic purpura: Secondary | ICD-10-CM

## 2018-04-18 ENCOUNTER — Inpatient Hospital Stay: Payer: Medicare Other | Attending: Hematology and Oncology

## 2018-04-18 DIAGNOSIS — D696 Thrombocytopenia, unspecified: Secondary | ICD-10-CM | POA: Insufficient documentation

## 2018-04-18 DIAGNOSIS — D693 Immune thrombocytopenic purpura: Secondary | ICD-10-CM

## 2018-04-18 LAB — CMP (CANCER CENTER ONLY)
ALT: <6 U/L (ref 0–55)
AST: 15 U/L (ref 5–34)
Albumin: 3.7 g/dL (ref 3.5–5.0)
Alkaline Phosphatase: 108 U/L (ref 40–150)
Anion gap: 10 (ref 3–11)
BUN: 13 mg/dL (ref 7–26)
CO2: 26 mmol/L (ref 22–29)
Calcium: 10.4 mg/dL (ref 8.4–10.4)
Chloride: 106 mmol/L (ref 98–109)
Creatinine: 1 mg/dL (ref 0.60–1.10)
GFR, Est AFR Am: >60 mL/min (ref >60)
GFR, Estimated: >60 mL/min (ref >60)
Glucose, Bld: 153 mg/dL — ABNORMAL HIGH (ref 70–140)
Potassium: 4 mmol/L (ref 3.5–5.1)
Sodium: 142 mmol/L (ref 136–145)
Total Bilirubin: 0.3 mg/dL (ref 0.2–1.2)
Total Protein: 7 g/dL (ref 6.4–8.3)

## 2018-04-18 LAB — CBC WITH DIFFERENTIAL (CANCER CENTER ONLY)
Basophils Absolute: 0 10*3/uL (ref 0.0–0.1)
Basophils Relative: 0 %
Eosinophils Absolute: 0 10*3/uL (ref 0.0–0.5)
Eosinophils Relative: 0 %
HCT: 41.5 % (ref 34.8–46.6)
Hemoglobin: 13.4 g/dL (ref 11.6–15.9)
Lymphocytes Relative: 39 %
Lymphs Abs: 1.1 10*3/uL (ref 0.9–3.3)
MCH: 30.7 pg (ref 25.1–34.0)
MCHC: 32.4 g/dL (ref 31.5–36.0)
MCV: 94.9 fL (ref 79.5–101.0)
Monocytes Absolute: 0.1 10*3/uL (ref 0.1–0.9)
Monocytes Relative: 5 %
Neutro Abs: 1.6 10*3/uL (ref 1.5–6.5)
Neutrophils Relative %: 56 %
Platelet Count: 82 10*3/uL — ABNORMAL LOW (ref 145–400)
RBC: 4.37 MIL/uL (ref 3.70–5.45)
RDW: 13.1 % (ref 11.2–14.5)
WBC Count: 2.8 10*3/uL — ABNORMAL LOW (ref 3.9–10.3)

## 2018-04-25 ENCOUNTER — Telehealth: Payer: Self-pay | Admitting: Hematology and Oncology

## 2018-04-25 ENCOUNTER — Inpatient Hospital Stay: Payer: Medicare Other | Attending: Hematology and Oncology | Admitting: Hematology and Oncology

## 2018-04-25 ENCOUNTER — Other Ambulatory Visit: Payer: Medicare Other

## 2018-04-25 ENCOUNTER — Encounter: Payer: Self-pay | Admitting: Hematology and Oncology

## 2018-04-25 DIAGNOSIS — K760 Fatty (change of) liver, not elsewhere classified: Secondary | ICD-10-CM | POA: Diagnosis not present

## 2018-04-25 DIAGNOSIS — R768 Other specified abnormal immunological findings in serum: Secondary | ICD-10-CM | POA: Insufficient documentation

## 2018-04-25 DIAGNOSIS — D61818 Other pancytopenia: Secondary | ICD-10-CM | POA: Diagnosis not present

## 2018-04-25 NOTE — Telephone Encounter (Signed)
Gave patient AVS and calendar of upcoming November appointments.  °

## 2018-04-25 NOTE — Assessment & Plan Note (Signed)
The patient has history of fatty liver disease Since the last time I saw her, she has modified her diet and have lost some weight I encouraged gentle exercise as tolerated

## 2018-04-25 NOTE — Progress Notes (Signed)
[place] OFFICE PROGRESS NOTE  Heather Agreste, MD  ASSESSMENT & PLAN:  Pancytopenia, acquired [facility]) She has persistent pancytopenia but asymptomatic I felt it is likely related to undiagnosed autoimmune disorder Appointment to see rheumatologist is pending I plan to see her back again in 6 months for further evaluation and repeat blood work The plan of care is discussed with her caregiver and I have addressed all questions and concerns   Positive ANA (antinuclear antibody) She tested positive with ANA screen The patient have chronic arthritis I recommend rheumatology consultation for further evaluation It is also possible that autoimmune disease can contribute to ITP  Fatty liver disease, nonalcoholic The patient has history of fatty liver disease Since the last time I saw her, she has modified her diet and have lost some weight I encouraged gentle exercise as tolerated   No orders of the defined types were placed in this encounter.   INTERVAL HISTORY: Heather [name] 57 y.o. female returns for further follow-up She feels well No recent infection She have attempted dietary modification and has lost some weight The patient denies any recent signs or symptoms of bleeding such as spontaneous epistaxis, hematuria or hematochezia. Her caregiver, Heather [name], is present and I have spoken with Heather [name], her medical healthcare power of attorney   SUMMARY OF HEMATOLOGIC HISTORY:  Heather [name] is here because of thrombocytopenia. She has mild mental retardation.  Her background history is not clear. She lives with Heather [name] for 4 years.  She was found to have abnormal CBC from recent blood work. She has normal CBC as of last year. Most recently, repeat CBC show mild leukopenia with a platelet count of 89,000.  That was subsequently repeated and it got even lower to 69,000. She had discovered recent bruising but denies spontaneous epistaxis, hematuria, melena or  hematochezia The patient denies history of liver disease, exposure to heparin, history of cardiac murmur/prior cardiovascular surgery or recent new medications She had received blood transfusion in the past. Although the patient denies a history of liver disease, ultrasound of the abdomen from April 2012 revealed fatty infiltration of the liver, suspicious for possible fatty liver disease versus other liver condition. She denies recent new medications.  She denies recent infection. Further workup revealed evidence of liver lesion on CT imaging and positive ANA screen. She was being observed from the hematology standpoint  I have reviewed the past medical history, past surgical history, social history and family history with the patient and they are unchanged from previous note.  ALLERGIES:  has no allergies on file.  MEDICATIONS:  Current Outpatient Medications  Medication Sig Dispense Refill  . desvenlafaxine (PRISTIQ) 50 MG 24 hr tablet Take 1 tablet (50 mg total) by mouth daily. 30 tablet 0  . methylphenidate (Heather [name]) 10 MG tablet Take 1 tablet (10 mg total) by mouth every morning. 30 tablet 0  . triamcinolone cream (KENALOG) 0.1 % Apply thin layer to affected area TID PRN itching 30 g 0   No current facility-administered medications for this visit.      REVIEW OF SYSTEMS:   Constitutional: Denies fevers, chills or night sweats Eyes: Denies blurriness of vision Ears, nose, mouth, throat, and face: Denies mucositis or sore throat Respiratory: Denies cough, dyspnea or wheezes Cardiovascular: Denies palpitation, chest discomfort or lower extremity swelling Gastrointestinal:  Denies nausea, heartburn or change in bowel habits Skin: Denies abnormal skin rashes Lymphatics: Denies new lymphadenopathy or easy bruising Neurological:Denies numbness, tingling or new weaknesses Behavioral/Psych:  Mood is stable, no new changes  All other systems were reviewed with the patient and are  negative.  PHYSICAL EXAMINATION: ECOG PERFORMANCE STATUS: 0 - Asymptomatic  Vitals:   04/25/18 1016  BP: 96/69  Pulse: 75  Resp: 18  Temp: 98.6 F (37 C)  SpO2: 98%   Filed Weights   04/25/18 1016  Weight: 135 lb 11.2 oz (61.6 kg)    GENERAL:alert, no distress and comfortable SKIN: skin color, texture, turgor are normal, no rashes or significant lesions EYES: normal, Conjunctiva are pink and non-injected, sclera clear OROPHARYNX:no exudate, no erythema and lips, buccal mucosa, and tongue normal  NECK: supple, thyroid normal size, non-tender, without nodularity LYMPH:  no palpable lymphadenopathy in the cervical, axillary or inguinal LUNGS: clear to auscultation and percussion with normal breathing effort HEART: regular rate & rhythm and no murmurs and no lower extremity edema ABDOMEN:abdomen soft, non-tender and normal bowel sounds Musculoskeletal:no cyanosis of digits and no clubbing  NEURO: alert & oriented x 3 with fluent speech, no focal motor/sensory deficits  LABORATORY DATA:  I have reviewed the data as listed     Component Value Date/Time   NA 142 04/18/2018 1110   NA 143 01/03/2018 1108   K 4.0 04/18/2018 1110   CL 106 04/18/2018 1110   CO2 26 04/18/2018 1110   GLUCOSE 153 (H) 04/18/2018 1110   BUN 13 04/18/2018 1110   BUN 19 01/03/2018 1108   CREATININE 1.00 04/18/2018 1110   CREATININE 0.85 08/22/2014 1031   CALCIUM 10.4 04/18/2018 1110   PROT 7.0 04/18/2018 1110   PROT 6.2 01/03/2018 1108   ALBUMIN 3.7 04/18/2018 1110   ALBUMIN 3.9 01/03/2018 1108   AST 15 04/18/2018 1110   ALT <6 04/18/2018 1110   ALKPHOS 108 04/18/2018 1110   BILITOT 0.3 04/18/2018 1110   GFRNONAA >60 04/18/2018 1110   GFRAA >60 04/18/2018 1110    No results found for: SPEP, UPEP  Lab Results  Component Value Date   WBC 2.8 (L) 04/18/2018   NEUTROABS 1.6 04/18/2018   HGB 13.4 04/18/2018   HCT 41.5 04/18/2018   MCV 94.9 04/18/2018   PLT 82 (L) 04/18/2018       Chemistry      Component Value Date/Time   NA 142 04/18/2018 1110   NA 143 01/03/2018 1108   K 4.0 04/18/2018 1110   CL 106 04/18/2018 1110   CO2 26 04/18/2018 1110   BUN 13 04/18/2018 1110   BUN 19 01/03/2018 1108   CREATININE 1.00 04/18/2018 1110   CREATININE 0.85 08/22/2014 1031      Component Value Date/Time   CALCIUM 10.4 04/18/2018 1110   ALKPHOS 108 04/18/2018 1110   AST 15 04/18/2018 1110   ALT <6 04/18/2018 1110   BILITOT 0.3 04/18/2018 1110     I spent 15 minutes counseling the patient face to face. The total time spent in the appointment was 20 minutes and more than 50% was on counseling.   All questions were answered. The patient knows to call the clinic with any problems, questions or concerns. No barriers to learning was detected.    [name], MD 5/2/201912:30 PM

## 2018-04-25 NOTE — Assessment & Plan Note (Signed)
She tested positive with ANA screen The patient have chronic arthritis I recommend rheumatology consultation for further evaluation It is also possible that autoimmune disease can contribute to ITP

## 2018-04-25 NOTE — Assessment & Plan Note (Signed)
She has persistent pancytopenia but asymptomatic I felt it is likely related to undiagnosed autoimmune disorder Appointment to see rheumatologist is pending I plan to see her back again in 6 months for further evaluation and repeat blood work The plan of care is discussed with her caregiver and I have addressed all questions and concerns

## 2018-04-27 IMAGING — DX DG SHOULDER 2+V*L*
3 series · 3 of 3 positions shown · non-contrast
Comparison: None.

CLINICAL DATA: Pain for several weeks

EXAM:
LEFT SHOULDER - 2+ VIEW

[shoulder ap]
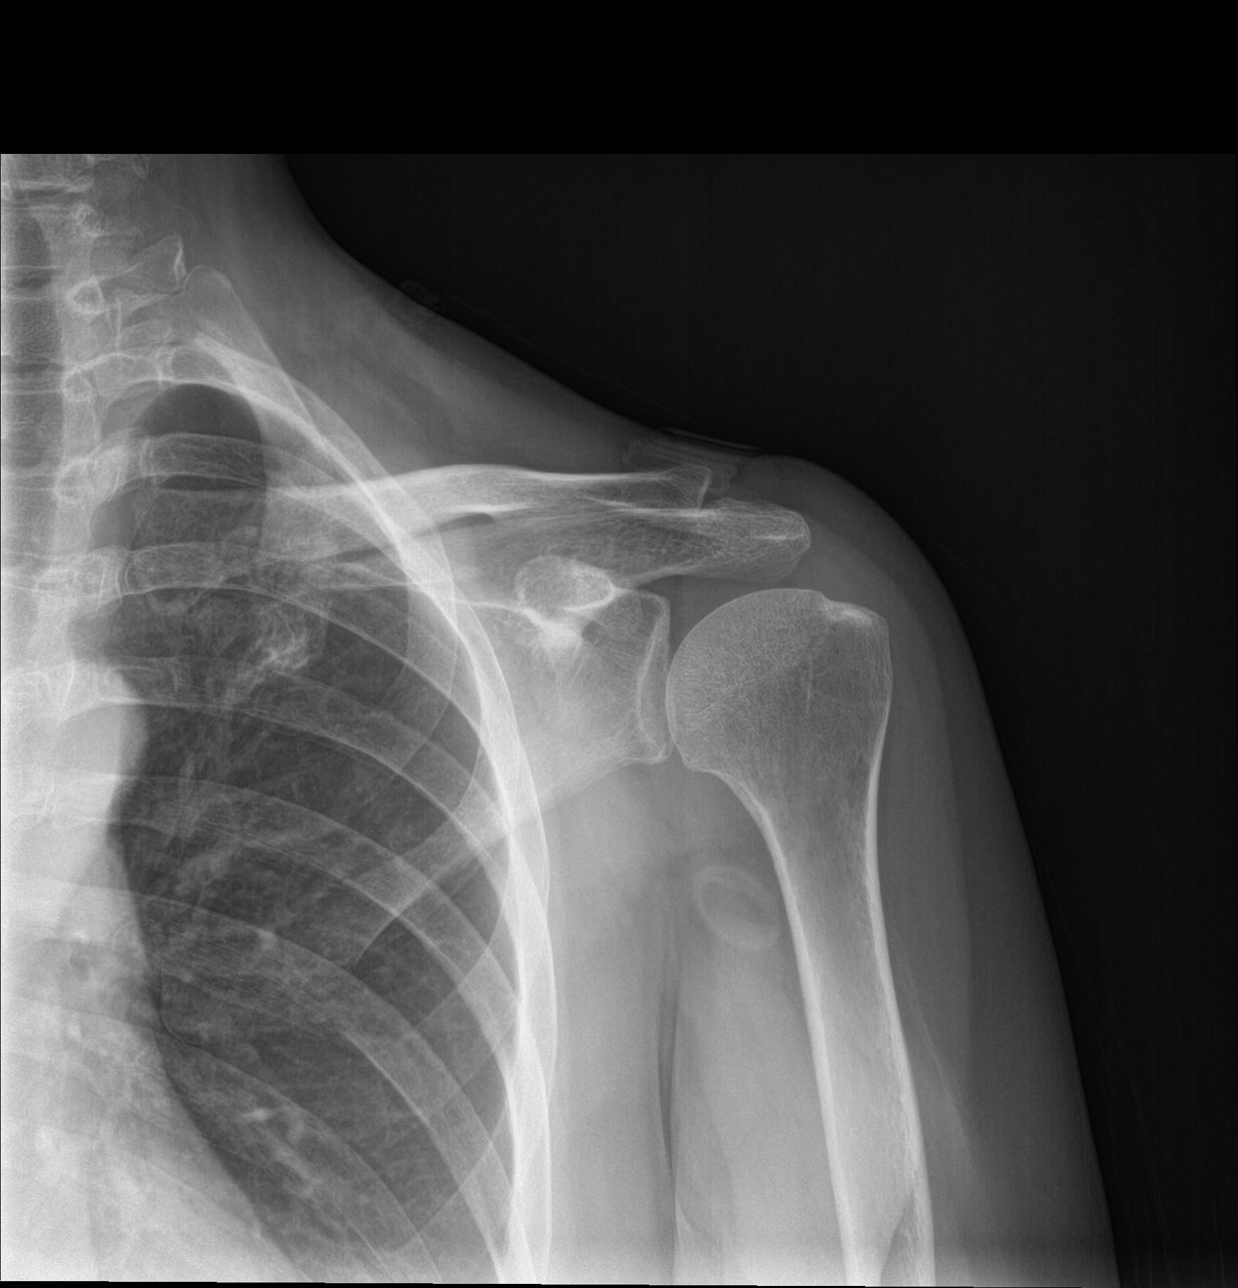

[shoulder y-view]
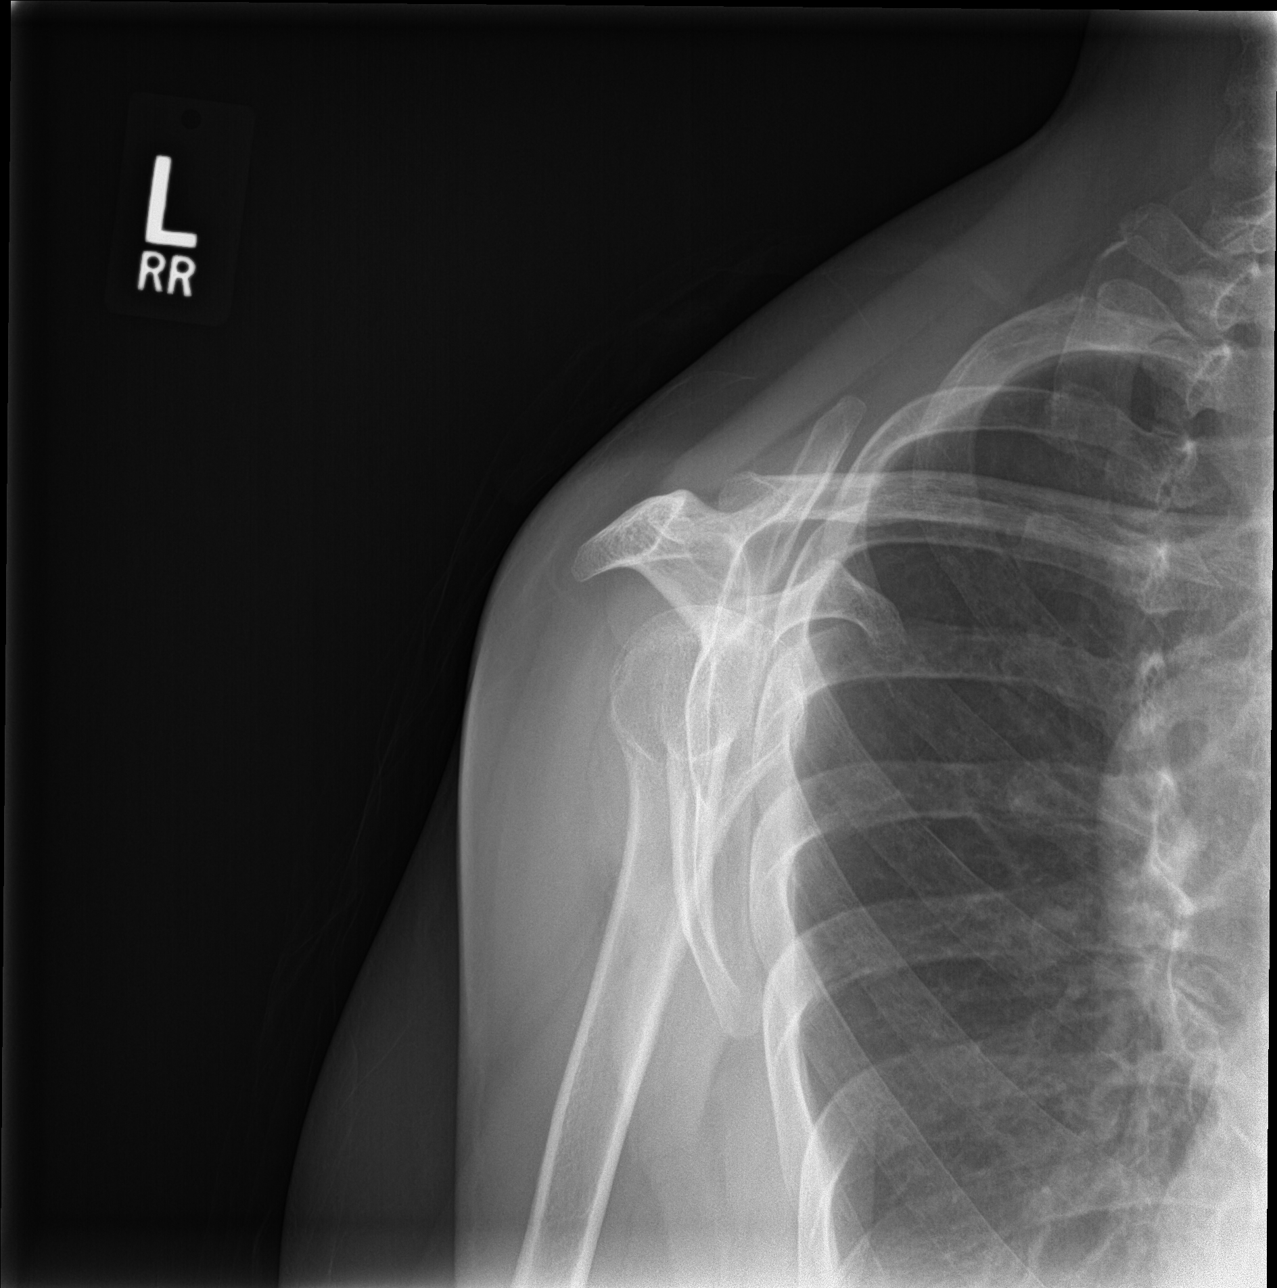

[shoulder axial]
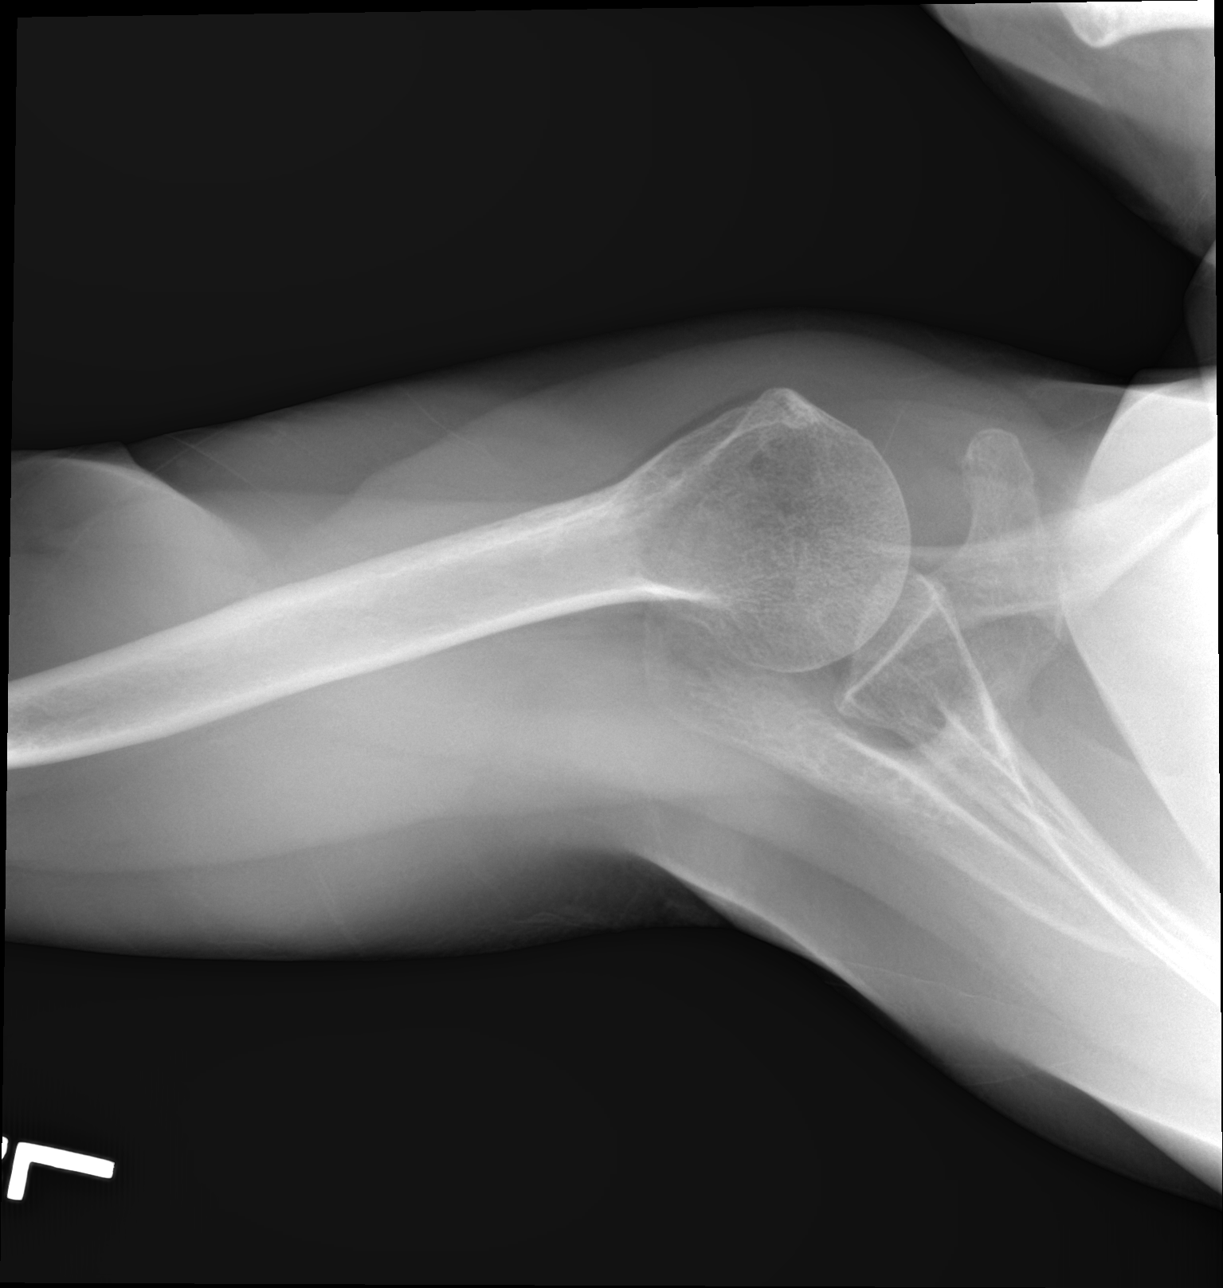

[3 of 3 positions shown; findings below may reference images not displayed]

FINDINGS: No acute fracture. No dislocation.  Unremarkable soft tissues.
IMPRESSION: No acute bony pathology.

## 2018-05-24 DIAGNOSIS — K769 Liver disease, unspecified: Secondary | ICD-10-CM | POA: Diagnosis not present

## 2018-05-24 DIAGNOSIS — M1711 Unilateral primary osteoarthritis, right knee: Secondary | ICD-10-CM | POA: Diagnosis not present

## 2018-05-24 DIAGNOSIS — D696 Thrombocytopenia, unspecified: Secondary | ICD-10-CM | POA: Diagnosis not present

## 2018-05-24 DIAGNOSIS — R768 Other specified abnormal immunological findings in serum: Secondary | ICD-10-CM | POA: Diagnosis not present

## 2018-05-24 DIAGNOSIS — D72819 Decreased white blood cell count, unspecified: Secondary | ICD-10-CM | POA: Diagnosis not present

## 2018-06-13 ENCOUNTER — Telehealth: Payer: Self-pay

## 2018-06-13 ENCOUNTER — Other Ambulatory Visit: Payer: Self-pay | Admitting: Hematology and Oncology

## 2018-06-13 DIAGNOSIS — D61818 Other pancytopenia: Secondary | ICD-10-CM

## 2018-06-13 DIAGNOSIS — R768 Other specified abnormal immunological findings in serum: Secondary | ICD-10-CM

## 2018-06-13 NOTE — Telephone Encounter (Signed)
Called and left a message asking her to call the nurse back in the office to schedule a lab appt.. Heather [name] needs lab work and urine test and then can fax lab results to [facility] at [phone].

## 2018-06-13 NOTE — Telephone Encounter (Signed)
Called Pax back. Given below message. She verbalized understanding. Given office fax number, she will contact the Rheumatologist office and get them to fax over what urine test they need.

## 2018-06-13 NOTE — Telephone Encounter (Signed)
She called back. Given below message again. Scheduling message sent for appt for lab on 6/25 or 6/28.

## 2018-06-13 NOTE — Telephone Encounter (Signed)
[name] called and left a message yesterday. Ms. Heather [name] saw the Rheumatologist in [place]. She needs another urine test and requests that it be done in [place]. Requesting appt to do urine test.

## 2018-06-13 NOTE — Telephone Encounter (Signed)
We need specific orders and I am not sure if they can be done here

## 2018-06-14 ENCOUNTER — Telehealth: Payer: Self-pay | Admitting: Hematology and Oncology

## 2018-06-18 ENCOUNTER — Inpatient Hospital Stay: Payer: Medicare Other | Attending: Hematology and Oncology

## 2018-06-18 ENCOUNTER — Telehealth: Payer: Self-pay

## 2018-06-18 DIAGNOSIS — D61818 Other pancytopenia: Secondary | ICD-10-CM | POA: Diagnosis not present

## 2018-06-18 DIAGNOSIS — R768 Other specified abnormal immunological findings in serum: Secondary | ICD-10-CM

## 2018-06-18 LAB — BASIC METABOLIC PANEL - CANCER CENTER ONLY
Anion gap: 9 (ref 5–15)
BUN: 12 mg/dL (ref 6–20)
CO2: 27 mmol/L (ref 22–32)
Calcium: 10.1 mg/dL (ref 8.9–10.3)
Chloride: 105 mmol/L (ref 98–111)
Creatinine: 0.89 mg/dL (ref 0.44–1.00)
GFR, Est AFR Am: >60 mL/min (ref >60)
GFR, Estimated: >60 mL/min (ref >60)
Glucose, Bld: 85 mg/dL (ref 70–99)
Potassium: 4.7 mmol/L (ref 3.5–5.1)
Sodium: 141 mmol/L (ref 135–145)

## 2018-06-18 LAB — URINALYSIS, COMPLETE (UACMP) WITH MICROSCOPIC
Bacteria, UA: NONE SEEN
Bilirubin Urine: NEGATIVE
Glucose, UA: NEGATIVE mg/dL
Hgb urine dipstick: NEGATIVE
Ketones, ur: NEGATIVE mg/dL
Leukocytes, UA: NEGATIVE
Nitrite: NEGATIVE
Protein, ur: NEGATIVE mg/dL
Specific Gravity, Urine: 1.012 (ref 1.005–1.030)
pH: 6 (ref 5.0–8.0)

## 2018-06-18 LAB — CBC WITH DIFFERENTIAL/PLATELET
Basophils Absolute: 0 10*3/uL (ref 0.0–0.1)
Basophils Relative: 0 %
Eosinophils Absolute: 0 10*3/uL (ref 0.0–0.5)
Eosinophils Relative: 0 %
HCT: 39.7 % (ref 34.8–46.6)
Hemoglobin: 13.1 g/dL (ref 11.6–15.9)
Lymphocytes Relative: 33 %
Lymphs Abs: 1.1 10*3/uL (ref 0.9–3.3)
MCH: 31.3 pg (ref 25.1–34.0)
MCHC: 33.1 g/dL (ref 31.5–36.0)
MCV: 94.6 fL (ref 79.5–101.0)
Monocytes Absolute: 0.2 10*3/uL (ref 0.1–0.9)
Monocytes Relative: 6 %
Neutro Abs: 2.1 10*3/uL (ref 1.5–6.5)
Neutrophils Relative %: 61 %
Platelets: 75 10*3/uL — ABNORMAL LOW (ref 145–400)
RBC: 4.2 MIL/uL (ref 3.70–5.45)
RDW: 13.1 % (ref 11.2–14.5)
WBC: 3.4 10*3/uL — ABNORMAL LOW (ref 3.9–10.3)

## 2018-06-18 LAB — PROTEIN / CREATININE RATIO, URINE
Creatinine, Urine: 113.01 mg/dL
Protein Creatinine Ratio: 0.06 mg/mg{Cre} (ref 0.00–0.15)
Total Protein, Urine: 7 mg/dL

## 2018-06-18 NOTE — Telephone Encounter (Signed)
-----   Message from [name], MD sent at 06/18/2018  1:55 PM EDT ----- Regarding: labs pateint requested labs to be done here for her rheumatologist I am not sure who to forward the results to ----- Message ----- From: Interface, Lab In [place] Sent: 06/18/2018  11:23 AM To: [name], MD

## 2018-06-18 NOTE — Telephone Encounter (Signed)
Faxed lab results to [facility] [phone].

## 2018-06-25 ENCOUNTER — Telehealth: Payer: Self-pay

## 2018-06-25 NOTE — Telephone Encounter (Signed)
[name], the caregiver called and left a message. [place] was referred to [facility] by Dr. Alvy Bimler. [facility] has referred her back to Dr. Alvy Bimler. She has called scheduling x 2 to make appt and was told they are waiting on a referral.

## 2018-06-26 NOTE — Telephone Encounter (Signed)
She called and just wanted to make sure Dr. Alvy Bimler could see all the lab results from [facility]. She is comfortable with waiting until scheduled appt with Dr. Alvy Bimler on 11/1, if Dr. Alvy Bimler does not want to see her any earlier. Instructed to call office if needed. She verbalized understanding.

## 2018-09-04 ENCOUNTER — Telehealth: Payer: Self-pay | Admitting: *Deleted

## 2018-09-04 ENCOUNTER — Telehealth: Payer: Self-pay | Admitting: Hematology and Oncology

## 2018-09-04 ENCOUNTER — Other Ambulatory Visit: Payer: Self-pay | Admitting: *Deleted

## 2018-09-04 DIAGNOSIS — D61818 Other pancytopenia: Secondary | ICD-10-CM

## 2018-09-04 NOTE — Telephone Encounter (Signed)
Appts scheduled caregiver notified date/time per 9/11 sch message

## 2018-09-04 NOTE — Telephone Encounter (Signed)
If they want to move her appt sooner, I can see her next Monday

## 2018-09-04 NOTE — Telephone Encounter (Signed)
TC from pt's caregiver, [name]. She states that p[t has 2 new bruises on her shoulders this morning and noted that pt's skin around her eyes was darker than usual. Pt denies falls or any kind of trauma.  [name] also noted that pt has been constipated and when she wipes there is a little bit of pink on the toilet paper.  No black stools, no red blood in toilet water. Last platelet count was done on 06/18/18 with result of 75K. Normal HGB. Pt's next lab and MD appt is scheduled for 10/25/18. Advised caregiver to continue to monitor pt's bruising and bowel movements and that if anything changes, to call back to the cancer center. [name] voiced understanding.

## 2018-09-04 NOTE — Telephone Encounter (Signed)
I spoke with pt's caregiver and she would like pt to be seen on Monday, 09/09/18 in the morning. High priority scheduling message sent for MD visit and labs

## 2018-09-04 NOTE — Telephone Encounter (Signed)
TC to pt's caregiver, [name].  Informed her that Dr. Alvy Bimler could see pt on 09/09/18.  Ms. Jari Favre is agreeable to that day but it needs to be in the morning for transportation reasons.  High priority scheduling message sent for appt/labs on 09/09/18 for the morning

## 2018-09-09 ENCOUNTER — Inpatient Hospital Stay (HOSPITAL_BASED_OUTPATIENT_CLINIC_OR_DEPARTMENT_OTHER): Payer: Medicare Other | Admitting: Hematology and Oncology

## 2018-09-09 ENCOUNTER — Inpatient Hospital Stay: Payer: Medicare Other | Attending: Hematology and Oncology

## 2018-09-09 VITALS — BP 116/66 | HR 97 | Temp 98.5°F | Resp 18 | Ht 59.0 in | Wt 127.6 lb

## 2018-09-09 DIAGNOSIS — R768 Other specified abnormal immunological findings in serum: Secondary | ICD-10-CM | POA: Insufficient documentation

## 2018-09-09 DIAGNOSIS — D696 Thrombocytopenia, unspecified: Secondary | ICD-10-CM

## 2018-09-09 DIAGNOSIS — F70 Mild intellectual disabilities: Secondary | ICD-10-CM | POA: Diagnosis not present

## 2018-09-09 DIAGNOSIS — F79 Unspecified intellectual disabilities: Secondary | ICD-10-CM | POA: Diagnosis not present

## 2018-09-09 DIAGNOSIS — D61818 Other pancytopenia: Secondary | ICD-10-CM | POA: Diagnosis not present

## 2018-09-09 DIAGNOSIS — D693 Immune thrombocytopenic purpura: Secondary | ICD-10-CM

## 2018-09-09 LAB — CBC WITH DIFFERENTIAL (CANCER CENTER ONLY)
Basophils Absolute: 0 10*3/uL (ref 0.0–0.1)
Basophils Relative: 1 %
Eosinophils Absolute: 0 10*3/uL (ref 0.0–0.5)
Eosinophils Relative: 0 %
HCT: 38.8 % (ref 34.8–46.6)
Hemoglobin: 12.9 g/dL (ref 11.6–15.9)
Lymphocytes Relative: 32 %
Lymphs Abs: 0.9 10*3/uL (ref 0.9–3.3)
MCH: 31.4 pg (ref 25.1–34.0)
MCHC: 33.2 g/dL (ref 31.5–36.0)
MCV: 94.6 fL (ref 79.5–101.0)
Monocytes Absolute: 0.2 10*3/uL (ref 0.1–0.9)
Monocytes Relative: 6 %
Neutro Abs: 1.7 10*3/uL (ref 1.5–6.5)
Neutrophils Relative %: 61 %
Platelet Count: 51 10*3/uL — ABNORMAL LOW (ref 145–400)
RBC: 4.1 MIL/uL (ref 3.70–5.45)
RDW: 12.9 % (ref 11.2–14.5)
WBC Count: 2.8 10*3/uL — ABNORMAL LOW (ref 3.9–10.3)

## 2018-09-09 LAB — BASIC METABOLIC PANEL - CANCER CENTER ONLY
Anion gap: 10 (ref 5–15)
BUN: 16 mg/dL (ref 6–20)
CO2: 29 mmol/L (ref 22–32)
Calcium: 10.1 mg/dL (ref 8.9–10.3)
Chloride: 105 mmol/L (ref 98–111)
Creatinine: 1.01 mg/dL — ABNORMAL HIGH (ref 0.44–1.00)
GFR, Est AFR Am: >60 mL/min (ref >60)
GFR, Estimated: 60 mL/min — ABNORMAL LOW (ref >60)
Glucose, Bld: 120 mg/dL — ABNORMAL HIGH (ref 70–99)
Potassium: 4 mmol/L (ref 3.5–5.1)
Sodium: 144 mmol/L (ref 135–145)

## 2018-09-09 MED ORDER — PREDNISONE 20 MG PO TABS: 40.0000 mg | ORAL_TABLET | Freq: Every day | ORAL | 0 refills | Status: DC

## 2018-09-10 ENCOUNTER — Encounter: Payer: Self-pay | Admitting: Hematology and Oncology

## 2018-09-10 NOTE — Assessment & Plan Note (Signed)
She has unspecified autoimmune condition.  Her rheumatologist is not planning to treat Due to significant hematological manifestation of autoimmune condition causing pancytopenia, I will start her on prednisone therapy

## 2018-09-10 NOTE — Assessment & Plan Note (Signed)
She has intellectual disability Her caregiver is her dedicated healthcare of power of attorney I discussed with her over the telephone about plan of management and she agree with the plan of care.

## 2018-09-10 NOTE — Progress Notes (Signed)
[place] OFFICE PROGRESS NOTE  [name], MD  ASSESSMENT & PLAN:  Pancytopenia, acquired [facility]) She has acquired pancytopenia likely secondary to autoimmune disease From the rheumatology standpoint, it is not felt that she needs to be treated.  However, her autoimmune disease is causing significant pancytopenia and she is symptomatic We discussed risk, benefits, side effects of prednisone therapy and she agreed to proceed I will start her on modest dose of 40 mg I warned her caregiver that that could exacerbate her mental health I recommend prednisone to be taken with food I will see her back next week for toxicity review The goal is to keep her platelet count well over 50,000 without bleeding complication.  Positive ANA (antinuclear antibody) She has unspecified autoimmune condition.  Her rheumatologist is not planning to treat Due to significant hematological manifestation of autoimmune condition causing pancytopenia, I will start her on prednisone therapy  Intellectual disability She has intellectual disability Her caregiver is her dedicated healthcare of power of attorney I discussed with her over the telephone about plan of management and she agree with the plan of care.   No orders of the defined types were placed in this encounter.   INTERVAL HISTORY: [name] 57 y.o. female returns for further follow-up.  She complained of excessive bruising She has also noted some passage of bright red blood per rectum likely secondary to hemorrhoidal bleeding after bowel movement.  Denies hematuria or epistaxis.  She bruises easily. She was seen by rheumatologist at [facility] but no further plan to start her on any treatment at this point in time.  SUMMARY OF HEMATOLOGIC HISTORY:  [name] is here because of thrombocytopenia. She has mild mental retardation.  Her background history is not clear. She lives with [name] for 4 years.  She was  found to have abnormal CBC from recent blood work. She has normal CBC as of last year. Most recently, repeat CBC show mild leukopenia with a platelet count of 89,000.  That was subsequently repeated and it got even lower to 69,000. She had discovered recent bruising but denies spontaneous epistaxis, hematuria, melena or hematochezia The patient denies history of liver disease, exposure to heparin, history of cardiac murmur/prior cardiovascular surgery or recent new medications She had received blood transfusion in the past. Although the patient denies a history of liver disease, ultrasound of the abdomen from April 2012 revealed fatty infiltration of the liver, suspicious for possible fatty liver disease versus other liver condition. She denies recent new medications.  She denies recent infection. Further workup revealed evidence of liver lesion on CT imaging and positive ANA screen. She was being observed from the hematology standpoint   I have reviewed the past medical history, past surgical history, social history and family history with the patient and they are unchanged from previous note.  ALLERGIES:  has no allergies on file.  MEDICATIONS:  Current Outpatient Medications  Medication Sig Dispense Refill  . desvenlafaxine (PRISTIQ) 50 MG 24 hr tablet Take 1 tablet (50 mg total) by mouth daily. 30 tablet 0  . methylphenidate (RITALIN) 10 MG tablet Take 1 tablet (10 mg total) by mouth every morning. 30 tablet 0  . predniSONE (DELTASONE) 20 MG tablet Take 2 tablets (40 mg total) by mouth daily with breakfast. 60 tablet 0  . triamcinolone cream (KENALOG) 0.1 % Apply thin layer to affected area TID PRN itching 30 g 0   No current facility-administered medications for this visit.  REVIEW OF SYSTEMS:   Constitutional: Denies fevers, chills or night sweats Eyes: Denies blurriness of vision Ears, nose, mouth, throat, and face: Denies mucositis or sore throat Respiratory: Denies cough,  dyspnea or wheezes Cardiovascular: Denies palpitation, chest discomfort or lower extremity swelling Gastrointestinal:  Denies nausea, heartburn or change in bowel habits Skin: Denies abnormal skin rashes Lymphatics: Denies new lymphadenopathy  Neurological:Denies numbness, tingling or new weaknesses Behavioral/Psych: Mood is stable, no new changes  All other systems were reviewed with the patient and are negative.  PHYSICAL EXAMINATION: ECOG PERFORMANCE STATUS: 1 - Symptomatic but completely ambulatory  Vitals:   09/09/18 0936  BP: 116/66  Pulse: 97  Resp: 18  Temp: 98.5 F (36.9 C)  SpO2: 98%   Filed Weights   09/09/18 0936  Weight: 127 lb 9.6 oz (57.9 kg)    GENERAL:alert, no distress and comfortable SKIN: Noted skin bruising EYES: normal, Conjunctiva are pink and non-injected, sclera clear OROPHARYNX:no exudate, no erythema and lips, buccal mucosa, and tongue normal  NECK: supple, thyroid normal size, non-tender, without nodularity LYMPH:  no palpable lymphadenopathy in the cervical, axillary or inguinal LUNGS: clear to auscultation and percussion with normal breathing effort HEART: regular rate & rhythm and no murmurs and no lower extremity edema ABDOMEN:abdomen soft, non-tender and normal bowel sounds Musculoskeletal:no cyanosis of digits and no clubbing  NEURO: alert & oriented x 3 with fluent speech, no focal motor/sensory deficits  LABORATORY DATA:  I have reviewed the data as listed     Component Value Date/Time   NA 144 09/09/2018 0913   NA 143 01/03/2018 1108   K 4.0 09/09/2018 0913   CL 105 09/09/2018 0913   CO2 29 09/09/2018 0913   GLUCOSE 120 (H) 09/09/2018 0913   BUN 16 09/09/2018 0913   BUN 19 01/03/2018 1108   CREATININE 1.01 (H) 09/09/2018 0913   CREATININE 0.85 08/22/2014 1031   CALCIUM 10.1 09/09/2018 0913   PROT 7.0 04/18/2018 1110   PROT 6.2 01/03/2018 1108   ALBUMIN 3.7 04/18/2018 1110   ALBUMIN 3.9 01/03/2018 1108   AST 15 04/18/2018  1110   ALT <6 04/18/2018 1110   ALKPHOS 108 04/18/2018 1110   BILITOT 0.3 04/18/2018 1110   GFRNONAA 60 (L) 09/09/2018 0913   GFRAA >60 09/09/2018 0913    No results found for: SPEP, UPEP  Lab Results  Component Value Date   WBC 2.8 (L) 09/09/2018   NEUTROABS 1.7 09/09/2018   HGB 12.9 09/09/2018   HCT 38.8 09/09/2018   MCV 94.6 09/09/2018   PLT 51 (L) 09/09/2018      Chemistry      Component Value Date/Time   NA 144 09/09/2018 0913   NA 143 01/03/2018 1108   K 4.0 09/09/2018 0913   CL 105 09/09/2018 0913   CO2 29 09/09/2018 0913   BUN 16 09/09/2018 0913   BUN 19 01/03/2018 1108   CREATININE 1.01 (H) 09/09/2018 0913   CREATININE 0.85 08/22/2014 1031      Component Value Date/Time   CALCIUM 10.1 09/09/2018 0913   ALKPHOS 108 04/18/2018 1110   AST 15 04/18/2018 1110   ALT <6 04/18/2018 1110   BILITOT 0.3 04/18/2018 1110       I spent 15 minutes counseling the patient face to face. The total time spent in the appointment was 20 minutes and more than 50% was on counseling.   All questions were answered. The patient knows to call the clinic with any problems, questions or concerns. No  barriers to learning was detected.    [name], MD 9/17/20195:28 PM

## 2018-09-10 NOTE — Assessment & Plan Note (Signed)
She has acquired pancytopenia likely secondary to autoimmune disease From the rheumatology standpoint, it is not felt that she needs to be treated.  However, her autoimmune disease is causing significant pancytopenia and she is symptomatic We discussed risk, benefits, side effects of prednisone therapy and she agreed to proceed I will start her on modest dose of 40 mg I warned her caregiver that that could exacerbate her mental health I recommend prednisone to be taken with food I will see her back next week for toxicity review The goal is to keep her platelet count well over 50,000 without bleeding complication.

## 2018-09-17 ENCOUNTER — Other Ambulatory Visit: Payer: Self-pay | Admitting: Hematology and Oncology

## 2018-09-17 DIAGNOSIS — D61818 Other pancytopenia: Secondary | ICD-10-CM

## 2018-09-18 ENCOUNTER — Inpatient Hospital Stay: Payer: Medicare Other | Admitting: Hematology and Oncology

## 2018-09-18 ENCOUNTER — Telehealth: Payer: Self-pay | Admitting: Hematology and Oncology

## 2018-09-18 ENCOUNTER — Inpatient Hospital Stay: Payer: Medicare Other

## 2018-09-18 NOTE — Telephone Encounter (Signed)
Returned [employer].  Spoke with caregiver, Ms. Green, to reschedule appts from 9/25.  Patient not able to make it.  Patient will come on 9/30 for lab and 10/1 for appt with NG.

## 2018-09-19 ENCOUNTER — Telehealth: Payer: Self-pay | Admitting: Hematology and Oncology

## 2018-09-19 ENCOUNTER — Telehealth: Payer: Self-pay

## 2018-09-19 NOTE — Telephone Encounter (Signed)
Per 9/26 sch message - pt wants to keep appts as is.

## 2018-09-19 NOTE — Telephone Encounter (Signed)
-----   Message from [name], MD sent at 09/19/2018 10:03 AM EDT ----- Regarding: appt She was no show yesterday I see now she is rescheduled to labs on Monday and see me on Tuesday That does not make sense since CBC only takes 5 mins She only need CBC. Please call her caregiver again, Heather [name] whether she just want to bring her in on Tues for labs and see me all same day, since I am overbooked on Monday

## 2018-09-19 NOTE — Telephone Encounter (Signed)
Called re appt that was moved to 10/1 per NG

## 2018-09-19 NOTE — Telephone Encounter (Signed)
Called and given below message. She verbalized understanding. Scheduling message sent for labs prior to appt with Dr. Alvy Bimler on 10/1.

## 2018-09-23 ENCOUNTER — Other Ambulatory Visit: Payer: Medicare Other

## 2018-09-24 ENCOUNTER — Telehealth: Payer: Self-pay | Admitting: Hematology and Oncology

## 2018-09-24 ENCOUNTER — Inpatient Hospital Stay: Payer: Medicare Other

## 2018-09-24 ENCOUNTER — Encounter: Payer: Self-pay | Admitting: Hematology and Oncology

## 2018-09-24 ENCOUNTER — Inpatient Hospital Stay: Payer: Medicare Other | Attending: Hematology and Oncology | Admitting: Hematology and Oncology

## 2018-09-24 DIAGNOSIS — Z7952 Long term (current) use of systemic steroids: Secondary | ICD-10-CM | POA: Diagnosis not present

## 2018-09-24 DIAGNOSIS — D61818 Other pancytopenia: Secondary | ICD-10-CM

## 2018-09-24 DIAGNOSIS — M8580 Other specified disorders of bone density and structure, unspecified site: Secondary | ICD-10-CM | POA: Diagnosis not present

## 2018-09-24 DIAGNOSIS — Z23 Encounter for immunization: Secondary | ICD-10-CM | POA: Insufficient documentation

## 2018-09-24 DIAGNOSIS — D693 Immune thrombocytopenic purpura: Secondary | ICD-10-CM | POA: Diagnosis not present

## 2018-09-24 DIAGNOSIS — R634 Abnormal weight loss: Secondary | ICD-10-CM | POA: Diagnosis not present

## 2018-09-24 DIAGNOSIS — F70 Mild intellectual disabilities: Secondary | ICD-10-CM | POA: Diagnosis not present

## 2018-09-24 DIAGNOSIS — R768 Other specified abnormal immunological findings in serum: Secondary | ICD-10-CM | POA: Diagnosis not present

## 2018-09-24 LAB — CBC WITH DIFFERENTIAL/PLATELET
Basophils Absolute: 0 10*3/uL (ref 0.0–0.1)
Basophils Relative: 0 %
Eosinophils Absolute: 0 10*3/uL (ref 0.0–0.5)
Eosinophils Relative: 0 %
HCT: 39.1 % (ref 34.8–46.6)
Hemoglobin: 13 g/dL (ref 11.6–15.9)
Lymphocytes Relative: 41 %
Lymphs Abs: 3.1 10*3/uL (ref 0.9–3.3)
MCH: 31.5 pg (ref 25.1–34.0)
MCHC: 33.1 g/dL (ref 31.5–36.0)
MCV: 95.1 fL (ref 79.5–101.0)
Monocytes Absolute: 0.3 10*3/uL (ref 0.1–0.9)
Monocytes Relative: 4 %
Neutro Abs: 4.1 10*3/uL (ref 1.5–6.5)
Neutrophils Relative %: 55 %
Platelets: 162 10*3/uL (ref 145–400)
RBC: 4.12 MIL/uL (ref 3.70–5.45)
RDW: 13.2 % (ref 11.2–14.5)
WBC: 7.5 10*3/uL (ref 3.9–10.3)

## 2018-09-24 NOTE — Progress Notes (Signed)
Heather [name] OFFICE PROGRESS NOTE  [name], MD  ASSESSMENT & PLAN:  Acute ITP [facility]) She has excellent response to prednisone therapy without major side effects I have given written instruction and discussed this verbally with her caregiver to reduce prednisone to 20 mg daily I plan to recheck CBC again next week My plan will be to initiate slow taper to titrate with goal to achieve platelet count above 50,000  Pancytopenia, acquired ([place]) Pancytopenia has improved, reflecting autoimmune nature of her pancytopenia and has responded well to prednisone treatment. We will continue close blood count monitoring  Osteopenia She had history of osteopenia I recommend recheck bone density scan this year She would be at risk of osteoporosis due to exposure to prednisone.  I recommend exercise as tolerated  Positive ANA (antinuclear antibody) She was referred to see rheumatologist Since she has recovery of her blood count and denies joint pain, it is not clear that future follow-up is necessary.   No orders of the defined types were placed in this encounter.   INTERVAL HISTORY: Heather [name] 57 y.o. female returns for follow-up on ITP Caregivers are present She felt that the prednisone is causing her wanting to eat more She denies joint pain She has some minor bruises but nothing new The patient denies any recent signs or symptoms of bleeding such as spontaneous epistaxis, hematuria or hematochezia.   SUMMARY OF HEMATOLOGIC HISTORY:  Heather [name] is here because of thrombocytopenia. She has mild mental retardation.  Her background history is not clear. She lives with [name] for 4 years.  She was found to have abnormal CBC from recent blood work. She has normal CBC as of last year. Most recently, repeat CBC show mild leukopenia with a platelet count of 89,000.  That was subsequently repeated and it got even lower to 69,000. She had discovered recent  bruising but denies spontaneous epistaxis, hematuria, melena or hematochezia The patient denies history of liver disease, exposure to heparin, history of cardiac murmur/prior cardiovascular surgery or recent new medications She had received blood transfusion in the past. Although the patient denies a history of liver disease, ultrasound of the abdomen from April 2012 revealed fatty infiltration of the liver, suspicious for possible fatty liver disease versus other liver condition. She denies recent new medications.  She denies recent infection. Further workup revealed evidence of liver lesion on CT imaging and positive ANA screen. She was being observed from the hematology standpoint to progression of pancytopenia On 09/09/2018, she was started on 40 mg of prednisone daily  I have reviewed the past medical history, past surgical history, social history and family history with the patient and they are unchanged from previous note.  ALLERGIES:  has no allergies on file.  MEDICATIONS:  Current Outpatient Medications  Medication Sig Dispense Refill  . desvenlafaxine (PRISTIQ) 50 MG 24 hr tablet Take 1 tablet (50 mg total) by mouth daily. 30 tablet 0  . methylphenidate (RITALIN) 10 MG tablet Take 1 tablet (10 mg total) by mouth every morning. 30 tablet 0  . predniSONE (DELTASONE) 20 MG tablet Take 2 tablets (40 mg total) by mouth daily with breakfast. 60 tablet 0  . triamcinolone cream (KENALOG) 0.1 % Apply thin layer to affected area TID PRN itching 30 g 0   No current facility-administered medications for this visit.      REVIEW OF SYSTEMS:   Constitutional: Denies fevers, chills or night sweats Eyes: Denies blurriness of vision Ears, nose, mouth,  throat, and face: Denies mucositis or sore throat Respiratory: Denies cough, dyspnea or wheezes Cardiovascular: Denies palpitation, chest discomfort or lower extremity swelling Gastrointestinal:  Denies nausea, heartburn or change in bowel  habits Skin: Denies abnormal skin rashes Lymphatics: Denies new lymphadenopathy  Neurological:Denies numbness, tingling or new weaknesses Behavioral/Psych: Mood is stable, no new changes  All other systems were reviewed with the patient and are negative.  PHYSICAL EXAMINATION: ECOG PERFORMANCE STATUS: 1 - Symptomatic but completely ambulatory  Vitals:   09/24/18 1000  BP: 117/61  Pulse: 82  Resp: 18  Temp: 97.8 F (36.6 C)  SpO2: 100%   Filed Weights   09/24/18 1000  Weight: 130 lb 9.6 oz (59.2 kg)    GENERAL:alert, no distress and comfortable SKIN: skin color, texture, turgor are normal, no rashes or significant lesions.  Noted minor skin bruising NEURO: alert & oriented x 3 with fluent speech, no focal motor/sensory deficits  LABORATORY DATA:  I have reviewed the data as listed     Component Value Date/Time   NA 144 09/09/2018 0913   NA 143 01/03/2018 1108   K 4.0 09/09/2018 0913   CL 105 09/09/2018 0913   CO2 29 09/09/2018 0913   GLUCOSE 120 (H) 09/09/2018 0913   BUN 16 09/09/2018 0913   BUN 19 01/03/2018 1108   CREATININE 1.01 (H) 09/09/2018 0913   CREATININE 0.85 08/22/2014 1031   CALCIUM 10.1 09/09/2018 0913   PROT 7.0 04/18/2018 1110   PROT 6.2 01/03/2018 1108   ALBUMIN 3.7 04/18/2018 1110   ALBUMIN 3.9 01/03/2018 1108   AST 15 04/18/2018 1110   ALT <6 04/18/2018 1110   ALKPHOS 108 04/18/2018 1110   BILITOT 0.3 04/18/2018 1110   GFRNONAA 60 (L) 09/09/2018 0913   GFRAA >60 09/09/2018 0913    No results found for: SPEP, UPEP  Lab Results  Component Value Date   WBC 7.5 09/24/2018   NEUTROABS 4.1 09/24/2018   HGB 13.0 09/24/2018   HCT 39.1 09/24/2018   MCV 95.1 09/24/2018   PLT 162 09/24/2018      Chemistry      Component Value Date/Time   NA 144 09/09/2018 0913   NA 143 01/03/2018 1108   K 4.0 09/09/2018 0913   CL 105 09/09/2018 0913   CO2 29 09/09/2018 0913   BUN 16 09/09/2018 0913   BUN 19 01/03/2018 1108   CREATININE 1.01 (H)  09/09/2018 0913   CREATININE 0.85 08/22/2014 1031      Component Value Date/Time   CALCIUM 10.1 09/09/2018 0913   ALKPHOS 108 04/18/2018 1110   AST 15 04/18/2018 1110   ALT <6 04/18/2018 1110   BILITOT 0.3 04/18/2018 1110       I spent 15 minutes counseling the patient face to face. The total time spent in the appointment was 20 minutes and more than 50% was on counseling.   All questions were answered. The patient knows to call the clinic with any problems, questions or concerns. No barriers to learning was detected.    [name], MD 10/1/20192:51 PM

## 2018-09-24 NOTE — Assessment & Plan Note (Signed)
She had history of osteopenia I recommend recheck bone density scan this year She would be at risk of osteoporosis due to exposure to prednisone.  I recommend exercise as tolerated

## 2018-09-24 NOTE — Telephone Encounter (Signed)
Gave avs and calendar ° °

## 2018-09-24 NOTE — Assessment & Plan Note (Signed)
She has excellent response to prednisone therapy without major side effects I have given written instruction and discussed this verbally with her caregiver to reduce prednisone to 20 mg daily I plan to recheck CBC again next week My plan will be to initiate slow taper to titrate with goal to achieve platelet count above 50,000

## 2018-09-24 NOTE — Assessment & Plan Note (Signed)
She was referred to see rheumatologist Since she has recovery of her blood count and denies joint pain, it is not clear that future follow-up is necessary.

## 2018-09-24 NOTE — Assessment & Plan Note (Signed)
Pancytopenia has improved, reflecting autoimmune nature of her pancytopenia and has responded well to prednisone treatment. We will continue close blood count monitoring

## 2018-10-01 ENCOUNTER — Encounter: Payer: Self-pay | Admitting: Hematology and Oncology

## 2018-10-01 ENCOUNTER — Inpatient Hospital Stay: Payer: Medicare Other

## 2018-10-01 ENCOUNTER — Inpatient Hospital Stay (HOSPITAL_BASED_OUTPATIENT_CLINIC_OR_DEPARTMENT_OTHER): Payer: Medicare Other | Admitting: Hematology and Oncology

## 2018-10-01 ENCOUNTER — Telehealth: Payer: Self-pay

## 2018-10-01 ENCOUNTER — Telehealth: Payer: Self-pay | Admitting: Hematology and Oncology

## 2018-10-01 DIAGNOSIS — R768 Other specified abnormal immunological findings in serum: Secondary | ICD-10-CM | POA: Diagnosis not present

## 2018-10-01 DIAGNOSIS — D693 Immune thrombocytopenic purpura: Secondary | ICD-10-CM | POA: Diagnosis not present

## 2018-10-01 DIAGNOSIS — D61818 Other pancytopenia: Secondary | ICD-10-CM

## 2018-10-01 DIAGNOSIS — F79 Unspecified intellectual disabilities: Secondary | ICD-10-CM | POA: Diagnosis not present

## 2018-10-01 DIAGNOSIS — Z23 Encounter for immunization: Secondary | ICD-10-CM | POA: Diagnosis not present

## 2018-10-01 DIAGNOSIS — Z7952 Long term (current) use of systemic steroids: Secondary | ICD-10-CM

## 2018-10-01 DIAGNOSIS — M8580 Other specified disorders of bone density and structure, unspecified site: Secondary | ICD-10-CM | POA: Diagnosis not present

## 2018-10-01 LAB — CBC WITH DIFFERENTIAL/PLATELET
Abs Immature Granulocytes: 0.03 10*3/uL (ref 0.00–0.07)
Basophils Absolute: 0 10*3/uL (ref 0.0–0.1)
Basophils Relative: 0 %
Eosinophils Absolute: 0 10*3/uL (ref 0.0–0.5)
Eosinophils Relative: 0 %
HCT: 41 % (ref 36.0–46.0)
Hemoglobin: 13 g/dL (ref 12.0–15.0)
Immature Granulocytes: 0 %
Lymphocytes Relative: 29 %
Lymphs Abs: 2 10*3/uL (ref 0.7–4.0)
MCH: 31.2 pg (ref 26.0–34.0)
MCHC: 31.7 g/dL (ref 30.0–36.0)
MCV: 98.3 fL (ref 80.0–100.0)
Monocytes Absolute: 0.3 10*3/uL (ref 0.1–1.0)
Monocytes Relative: 5 %
Neutro Abs: 4.4 10*3/uL (ref 1.7–7.7)
Neutrophils Relative %: 66 %
Platelets: 113 10*3/uL — ABNORMAL LOW (ref 150–400)
RBC: 4.17 MIL/uL (ref 3.87–5.11)
RDW: 13.2 % (ref 11.5–15.5)
WBC: 6.8 10*3/uL (ref 4.0–10.5)
nRBC: 0 % (ref 0.0–0.2)

## 2018-10-01 MED ORDER — PREDNISONE 20 MG PO TABS: 10.0000 mg | ORAL_TABLET | Freq: Every day | ORAL | 0 refills | Status: DC

## 2018-10-01 NOTE — Assessment & Plan Note (Signed)
She has mild thrombocytopenia with recent prednisone dose adjustment The goal of treatment is to get her platelet count well above 50 I plan to continue dose reduction of prednisone to 10 mg daily I explained to the patient the rationale of dose reduction I tried to call her caregiver 3 times but unable to get hold of her Plan to see her again next week for further follow-up

## 2018-10-01 NOTE — Progress Notes (Signed)
[place] OFFICE PROGRESS NOTE  [name], MD  ASSESSMENT & PLAN:  Acute ITP [facility]) She has mild thrombocytopenia with recent prednisone dose adjustment The goal of treatment is to get her platelet count well above 50 I plan to continue dose reduction of prednisone to 10 mg daily I explained to the patient the rationale of dose reduction I tried to call her caregiver 3 times but unable to get hold of her Plan to see her again next week for further follow-up  Intellectual disability The patient is concerned about weight gain and side effects causing insomnia I tried to reassure the patient that with plan to reduce the dose of treatment, hopefully, the weight gain and insomnia will go away.   No orders of the defined types were placed in this encounter.   INTERVAL HISTORY: Heather [name] 57 y.o. female returns for further follow-up with her caregiver She is concerned about recent weight gain and insomnia No recent infection, fever or chills. The patient denies any recent signs or symptoms of bleeding such as spontaneous epistaxis, hematuria or hematochezia.  SUMMARY OF HEMATOLOGIC HISTORY:  Heather [name] is here because of thrombocytopenia. She has mild mental retardation.  Her background history is not clear. She lives with [name] for 4 years.  She was found to have abnormal CBC from recent blood work. She has normal CBC as of last year. Most recently, repeat CBC show mild leukopenia with a platelet count of 89,000.  That was subsequently repeated and it got even lower to 69,000. She had discovered recent bruising but denies spontaneous epistaxis, hematuria, melena or hematochezia The patient denies history of liver disease, exposure to heparin, history of cardiac murmur/prior cardiovascular surgery or recent new medications She had received blood transfusion in the past. Although the patient denies a history of liver disease, ultrasound of the  abdomen from April 2012 revealed fatty infiltration of the liver, suspicious for possible fatty liver disease versus other liver condition. She denies recent new medications.  She denies recent infection. Further workup revealed evidence of liver lesion on CT imaging and positive ANA screen. She was being observed from the hematology standpoint to progression of pancytopenia On 09/09/2018, she was started on 40 mg of prednisone daily On October 1st, 2019, the dose of prednisone is reduced to 20 mg daily  I have reviewed the past medical history, past surgical history, social history and family history with the patient and they are unchanged from previous note.  ALLERGIES:  has no allergies on file.  MEDICATIONS:  Current Outpatient Medications  Medication Sig Dispense Refill  . desvenlafaxine (PRISTIQ) 50 MG 24 hr tablet Take 1 tablet (50 mg total) by mouth daily. 30 tablet 0  . methylphenidate (RITALIN) 10 MG tablet Take 1 tablet (10 mg total) by mouth every morning. 30 tablet 0  . predniSONE (DELTASONE) 20 MG tablet Take 0.5 tablets (10 mg total) by mouth daily with breakfast. 60 tablet 0  . triamcinolone cream (KENALOG) 0.1 % Apply thin layer to affected area TID PRN itching 30 g 0   No current facility-administered medications for this visit.      REVIEW OF SYSTEMS:   Constitutional: Denies fevers, chills or night sweats Eyes: Denies blurriness of vision Ears, nose, mouth, throat, and face: Denies mucositis or sore throat Respiratory: Denies cough, dyspnea or wheezes Cardiovascular: Denies palpitation, chest discomfort or lower extremity swelling Gastrointestinal:  Denies nausea, heartburn or change in bowel habits Skin: Denies abnormal skin  rashes Lymphatics: Denies new lymphadenopathy or easy bruising Neurological:Denies numbness, tingling or new weaknesses Behavioral/Psych: Mood is stable, no new changes  All other systems were reviewed with the patient and are  negative.  PHYSICAL EXAMINATION: ECOG PERFORMANCE STATUS: 1 - Symptomatic but completely ambulatory  Vitals:   10/01/18 1039  BP: (!) 113/59  Pulse: 75  Resp: 18  Temp: 97.7 F (36.5 C)  SpO2: 100%   Filed Weights   10/01/18 1039  Weight: 132 lb 9.6 oz (60.1 kg)    GENERAL:alert, no distress and comfortable Musculoskeletal:no cyanosis of digits and no clubbing  NEURO: alert & oriented x 3 with fluent speech, no focal motor/sensory deficits  LABORATORY DATA:  I have reviewed the data as listed     Component Value Date/Time   NA 144 09/09/2018 0913   NA 143 01/03/2018 1108   K 4.0 09/09/2018 0913   CL 105 09/09/2018 0913   CO2 29 09/09/2018 0913   GLUCOSE 120 (H) 09/09/2018 0913   BUN 16 09/09/2018 0913   BUN 19 01/03/2018 1108   CREATININE 1.01 (H) 09/09/2018 0913   CREATININE 0.85 08/22/2014 1031   CALCIUM 10.1 09/09/2018 0913   PROT 7.0 04/18/2018 1110   PROT 6.2 01/03/2018 1108   ALBUMIN 3.7 04/18/2018 1110   ALBUMIN 3.9 01/03/2018 1108   AST 15 04/18/2018 1110   ALT <6 04/18/2018 1110   ALKPHOS 108 04/18/2018 1110   BILITOT 0.3 04/18/2018 1110   GFRNONAA 60 (L) 09/09/2018 0913   GFRAA >60 09/09/2018 0913    No results found for: SPEP, UPEP  Lab Results  Component Value Date   WBC 6.8 10/01/2018   NEUTROABS 4.4 10/01/2018   HGB 13.0 10/01/2018   HCT 41.0 10/01/2018   MCV 98.3 10/01/2018   PLT 113 (L) 10/01/2018      Chemistry      Component Value Date/Time   NA 144 09/09/2018 0913   NA 143 01/03/2018 1108   K 4.0 09/09/2018 0913   CL 105 09/09/2018 0913   CO2 29 09/09/2018 0913   BUN 16 09/09/2018 0913   BUN 19 01/03/2018 1108   CREATININE 1.01 (H) 09/09/2018 0913   CREATININE 0.85 08/22/2014 1031      Component Value Date/Time   CALCIUM 10.1 09/09/2018 0913   ALKPHOS 108 04/18/2018 1110   AST 15 04/18/2018 1110   ALT <6 04/18/2018 1110   BILITOT 0.3 04/18/2018 1110       I spent 10 minutes counseling the patient face to face.  The total time spent in the appointment was 15 minutes and more than 50% was on counseling.   All questions were answered. The patient knows to call the clinic with any problems, questions or concerns. No barriers to learning was detected.    [name], MD 10/8/20192:48 PM

## 2018-10-01 NOTE — Assessment & Plan Note (Signed)
The patient is concerned about weight gain and side effects causing insomnia I tried to reassure the patient that with plan to reduce the dose of treatment, hopefully, the weight gain and insomnia will go away.

## 2018-10-01 NOTE — Telephone Encounter (Signed)
[name] called and left a message. She missed a call from Dr. Alvy Bimler.  Called back. Per Dr. Alvy Bimler, I  read today's office note from today. Given upcoming appts dates and times. She verbalized understanding.

## 2018-10-01 NOTE — Telephone Encounter (Signed)
Gave pt avs and calendar  °

## 2018-10-02 ENCOUNTER — Other Ambulatory Visit: Payer: Self-pay | Admitting: Hematology and Oncology

## 2018-10-08 ENCOUNTER — Telehealth: Payer: Self-pay | Admitting: Hematology and Oncology

## 2018-10-08 ENCOUNTER — Inpatient Hospital Stay (HOSPITAL_BASED_OUTPATIENT_CLINIC_OR_DEPARTMENT_OTHER): Payer: Medicare Other | Admitting: Hematology and Oncology

## 2018-10-08 ENCOUNTER — Encounter: Payer: Self-pay | Admitting: Hematology and Oncology

## 2018-10-08 ENCOUNTER — Inpatient Hospital Stay: Payer: Medicare Other

## 2018-10-08 DIAGNOSIS — D61818 Other pancytopenia: Secondary | ICD-10-CM

## 2018-10-08 DIAGNOSIS — Z7952 Long term (current) use of systemic steroids: Secondary | ICD-10-CM | POA: Diagnosis not present

## 2018-10-08 DIAGNOSIS — D693 Immune thrombocytopenic purpura: Secondary | ICD-10-CM | POA: Diagnosis not present

## 2018-10-08 DIAGNOSIS — Z23 Encounter for immunization: Secondary | ICD-10-CM | POA: Diagnosis not present

## 2018-10-08 DIAGNOSIS — F70 Mild intellectual disabilities: Secondary | ICD-10-CM | POA: Diagnosis not present

## 2018-10-08 DIAGNOSIS — R768 Other specified abnormal immunological findings in serum: Secondary | ICD-10-CM | POA: Diagnosis not present

## 2018-10-08 DIAGNOSIS — R634 Abnormal weight loss: Secondary | ICD-10-CM | POA: Diagnosis not present

## 2018-10-08 DIAGNOSIS — M8580 Other specified disorders of bone density and structure, unspecified site: Secondary | ICD-10-CM | POA: Diagnosis not present

## 2018-10-08 LAB — CBC WITH DIFFERENTIAL/PLATELET
Abs Immature Granulocytes: 0.03 10*3/uL (ref 0.00–0.07)
Basophils Absolute: 0 10*3/uL (ref 0.0–0.1)
Basophils Relative: 0 %
Eosinophils Absolute: 0 10*3/uL (ref 0.0–0.5)
Eosinophils Relative: 0 %
HCT: 39.7 % (ref 36.0–46.0)
Hemoglobin: 12.7 g/dL (ref 12.0–15.0)
Immature Granulocytes: 1 %
Lymphocytes Relative: 24 %
Lymphs Abs: 1.5 10*3/uL (ref 0.7–4.0)
MCH: 31.3 pg (ref 26.0–34.0)
MCHC: 32 g/dL (ref 30.0–36.0)
MCV: 97.8 fL (ref 80.0–100.0)
Monocytes Absolute: 0.3 10*3/uL (ref 0.1–1.0)
Monocytes Relative: 4 %
Neutro Abs: 4.3 10*3/uL (ref 1.7–7.7)
Neutrophils Relative %: 71 %
Platelets: 89 10*3/uL — ABNORMAL LOW (ref 150–400)
RBC: 4.06 MIL/uL (ref 3.87–5.11)
RDW: 13.1 % (ref 11.5–15.5)
WBC: 6 10*3/uL (ref 4.0–10.5)
nRBC: 0 % (ref 0.0–0.2)

## 2018-10-08 NOTE — Assessment & Plan Note (Signed)
Her caregiver was worried about her weight loss I suspect is due to recent prednisone taper The patient does not look malnourished I am not concerned about her needing nutritional supplement I will order complete metabolic panel in the next visit to assess her nutritional status

## 2018-10-08 NOTE — Telephone Encounter (Signed)
Gave avs and calendar ° °

## 2018-10-08 NOTE — Assessment & Plan Note (Signed)
She has mild thrombocytopenia with recent prednisone dose adjustment The goal of treatment is to get her platelet count well above 50 I plan to continue dose reduction of prednisone to 5 mg daily I explained to the patient the rationale of dose reduction We also discussed other potential options with rituximab, Promacta, Romiplostim or immunosuppressant therapy For now, she is in agreement to try 5 mg of prednisone only I plan to give her influenza vaccination in her next visit

## 2018-10-08 NOTE — Progress Notes (Signed)
[place] OFFICE PROGRESS NOTE  [name], MD  ASSESSMENT & PLAN:  Acute ITP [facility]) She has mild thrombocytopenia with recent prednisone dose adjustment The goal of treatment is to get her platelet count well above 50 I plan to continue dose reduction of prednisone to 5 mg daily I explained to the patient the rationale of dose reduction We also discussed other potential options with rituximab, Promacta, Romiplostim or immunosuppressant therapy For now, she is in agreement to try 5 mg of prednisone only I plan to give her influenza vaccination in her next visit   Weight loss Her caregiver was worried about her weight loss I suspect is due to recent prednisone taper The patient does not look malnourished I am not concerned about her needing nutritional supplement I will order complete metabolic panel in the next visit to assess her nutritional status  Pancytopenia, acquired ([place]) Her leukopenia was due to autoimmune phenomenon With prednisone, it is back to within normal limits.  No further work-up is necessary   Orders Placed This Encounter  Procedures  . Comprehensive metabolic panel    Standing Status:   Future    Standing Expiration Date:   11/12/2019    INTERVAL HISTORY: Heather [name] 57 y.o. female returns for further follow-up with ITP Her caregiver is present I also talked to [place], her healthcare power of attorney over the phone The patient is doing very well No recent bleeding complications She has lost some weight since the last time I saw her  SUMMARY OF HEMATOLOGIC HISTORY:  Heather [name] is here because of thrombocytopenia. She has mild mental retardation.  Her background history is not clear. She lives with [name] for 4 years.  She was found to have abnormal CBC from recent blood work. She has normal CBC as of last year. Most recently, repeat CBC show mild leukopenia with a platelet count of 89,000.  That was  subsequently repeated and it got even lower to 69,000. She had discovered recent bruising but denies spontaneous epistaxis, hematuria, melena or hematochezia The patient denies history of liver disease, exposure to heparin, history of cardiac murmur/prior cardiovascular surgery or recent new medications She had received blood transfusion in the past. Although the patient denies a history of liver disease, ultrasound of the abdomen from April 2012 revealed fatty infiltration of the liver, suspicious for possible fatty liver disease versus other liver condition. She denies recent new medications.  She denies recent infection. Further workup revealed evidence of liver lesion on CT imaging and positive ANA screen. She was being observed from the hematology standpoint to progression of pancytopenia On 09/09/2018, she was started on 40 mg of prednisone daily On October 1st, 2019, the dose of prednisone is reduced to 20 mg daily On October 01, 2018, dose of prednisone is reduced to 10 mg daily On October 08, 2018, the dose of prednisone is reduced to 5 mg  I have reviewed the past medical history, past surgical history, social history and family history with the patient and they are unchanged from previous note.  ALLERGIES:  has no allergies on file.  MEDICATIONS:  Current Outpatient Medications  Medication Sig Dispense Refill  . desvenlafaxine (PRISTIQ) 50 MG 24 hr tablet Take 1 tablet (50 mg total) by mouth daily. 30 tablet 0  . methylphenidate (RITALIN) 10 MG tablet Take 1 tablet (10 mg total) by mouth every morning. 30 tablet 0  . predniSONE (DELTASONE) 10 MG tablet Take 1 tablet (10 mg  total) by mouth daily with breakfast. 30 tablet 1  . triamcinolone cream (KENALOG) 0.1 % Apply thin layer to affected area TID PRN itching 30 g 0   No current facility-administered medications for this visit.      REVIEW OF SYSTEMS:   Constitutional: Denies fevers, chills or night sweats Eyes: Denies  blurriness of vision Ears, nose, mouth, throat, and face: Denies mucositis or sore throat Respiratory: Denies cough, dyspnea or wheezes Cardiovascular: Denies palpitation, chest discomfort or lower extremity swelling Gastrointestinal:  Denies nausea, heartburn or change in bowel habits Skin: Denies abnormal skin rashes Lymphatics: Denies new lymphadenopathy or easy bruising Neurological:Denies numbness, tingling or new weaknesses Behavioral/Psych: Mood is stable, no new changes  All other systems were reviewed with the patient and are negative.  PHYSICAL EXAMINATION: ECOG PERFORMANCE STATUS: 0 - Asymptomatic  Vitals:   10/08/18 1019  BP: 116/71  Pulse: 79  Resp: 18  Temp: 98.1 F (36.7 C)  SpO2: 100%   Filed Weights   10/08/18 1019  Weight: 128 lb (58.1 kg)    GENERAL:alert, no distress and comfortable SKIN: skin color, texture, turgor are normal, no rashes or significant lesions EYES: normal, Conjunctiva are pink and non-injected, sclera clear OROPHARYNX:no exudate, no erythema and lips, buccal mucosa, and tongue normal  NECK: supple, thyroid normal size, non-tender, without nodularity LYMPH:  no palpable lymphadenopathy in the cervical, axillary or inguinal LUNGS: clear to auscultation and percussion with normal breathing effort HEART: regular rate & rhythm and no murmurs and no lower extremity edema ABDOMEN:abdomen soft, non-tender and normal bowel sounds Musculoskeletal:no cyanosis of digits and no clubbing  NEURO: alert & oriented x 3 with fluent speech, no focal motor/sensory deficits  LABORATORY DATA:  I have reviewed the data as listed     Component Value Date/Time   NA 144 09/09/2018 0913   NA 143 01/03/2018 1108   K 4.0 09/09/2018 0913   CL 105 09/09/2018 0913   CO2 29 09/09/2018 0913   GLUCOSE 120 (H) 09/09/2018 0913   BUN 16 09/09/2018 0913   BUN 19 01/03/2018 1108   CREATININE 1.01 (H) 09/09/2018 0913   CREATININE 0.85 08/22/2014 1031   CALCIUM 10.1  09/09/2018 0913   PROT 7.0 04/18/2018 1110   PROT 6.2 01/03/2018 1108   ALBUMIN 3.7 04/18/2018 1110   ALBUMIN 3.9 01/03/2018 1108   AST 15 04/18/2018 1110   ALT <6 04/18/2018 1110   ALKPHOS 108 04/18/2018 1110   BILITOT 0.3 04/18/2018 1110   GFRNONAA 60 (L) 09/09/2018 0913   GFRAA >60 09/09/2018 0913    No results found for: SPEP, UPEP  Lab Results  Component Value Date   WBC 6.0 10/08/2018   NEUTROABS 4.3 10/08/2018   HGB 12.7 10/08/2018   HCT 39.7 10/08/2018   MCV 97.8 10/08/2018   PLT 89 (L) 10/08/2018      Chemistry      Component Value Date/Time   NA 144 09/09/2018 0913   NA 143 01/03/2018 1108   K 4.0 09/09/2018 0913   CL 105 09/09/2018 0913   CO2 29 09/09/2018 0913   BUN 16 09/09/2018 0913   BUN 19 01/03/2018 1108   CREATININE 1.01 (H) 09/09/2018 0913   CREATININE 0.85 08/22/2014 1031      Component Value Date/Time   CALCIUM 10.1 09/09/2018 0913   ALKPHOS 108 04/18/2018 1110   AST 15 04/18/2018 1110   ALT <6 04/18/2018 1110   BILITOT 0.3 04/18/2018 1110     I spent 10  minutes counseling the patient face to face. The total time spent in the appointment was 15 minutes and more than 50% was on counseling.   All questions were answered. The patient knows to call the clinic with any problems, questions or concerns. No barriers to learning was detected.    [name], MD 10/15/201910:52 AM

## 2018-10-08 NOTE — Assessment & Plan Note (Signed)
Her leukopenia was due to autoimmune phenomenon With prednisone, it is back to within normal limits.  No further work-up is necessary

## 2018-10-15 ENCOUNTER — Inpatient Hospital Stay: Payer: Medicare Other

## 2018-10-15 ENCOUNTER — Telehealth: Payer: Self-pay | Admitting: Hematology and Oncology

## 2018-10-15 ENCOUNTER — Encounter: Payer: Self-pay | Admitting: Hematology and Oncology

## 2018-10-15 ENCOUNTER — Inpatient Hospital Stay (HOSPITAL_BASED_OUTPATIENT_CLINIC_OR_DEPARTMENT_OTHER): Payer: Medicare Other | Admitting: Hematology and Oncology

## 2018-10-15 VITALS — BP 112/63 | HR 85 | Temp 98.0°F | Resp 18 | Ht 59.0 in | Wt 128.4 lb

## 2018-10-15 DIAGNOSIS — D61818 Other pancytopenia: Secondary | ICD-10-CM

## 2018-10-15 DIAGNOSIS — R768 Other specified abnormal immunological findings in serum: Secondary | ICD-10-CM | POA: Diagnosis not present

## 2018-10-15 DIAGNOSIS — D693 Immune thrombocytopenic purpura: Secondary | ICD-10-CM

## 2018-10-15 DIAGNOSIS — Z7952 Long term (current) use of systemic steroids: Secondary | ICD-10-CM | POA: Diagnosis not present

## 2018-10-15 DIAGNOSIS — F79 Unspecified intellectual disabilities: Secondary | ICD-10-CM | POA: Diagnosis not present

## 2018-10-15 DIAGNOSIS — Z23 Encounter for immunization: Secondary | ICD-10-CM | POA: Diagnosis not present

## 2018-10-15 DIAGNOSIS — M8580 Other specified disorders of bone density and structure, unspecified site: Secondary | ICD-10-CM | POA: Diagnosis not present

## 2018-10-15 DIAGNOSIS — R634 Abnormal weight loss: Secondary | ICD-10-CM

## 2018-10-15 LAB — CBC WITH DIFFERENTIAL/PLATELET
Abs Immature Granulocytes: 0.01 10*3/uL (ref 0.00–0.07)
Basophils Absolute: 0 10*3/uL (ref 0.0–0.1)
Basophils Relative: 0 %
Eosinophils Absolute: 0 10*3/uL (ref 0.0–0.5)
Eosinophils Relative: 0 %
HCT: 40.1 % (ref 36.0–46.0)
Hemoglobin: 12.7 g/dL (ref 12.0–15.0)
Immature Granulocytes: 0 %
Lymphocytes Relative: 48 %
Lymphs Abs: 2 10*3/uL (ref 0.7–4.0)
MCH: 31.2 pg (ref 26.0–34.0)
MCHC: 31.7 g/dL (ref 30.0–36.0)
MCV: 98.5 fL (ref 80.0–100.0)
Monocytes Absolute: 0.2 10*3/uL (ref 0.1–1.0)
Monocytes Relative: 5 %
Neutro Abs: 2 10*3/uL (ref 1.7–7.7)
Neutrophils Relative %: 47 %
Platelets: 93 10*3/uL — ABNORMAL LOW (ref 150–400)
RBC: 4.07 MIL/uL (ref 3.87–5.11)
RDW: 13.2 % (ref 11.5–15.5)
WBC: 4.2 10*3/uL (ref 4.0–10.5)
nRBC: 0 % (ref 0.0–0.2)

## 2018-10-15 LAB — COMPREHENSIVE METABOLIC PANEL
ALT: 6 U/L (ref 0–44)
AST: 15 U/L (ref 15–41)
Albumin: 3.4 g/dL — ABNORMAL LOW (ref 3.5–5.0)
Alkaline Phosphatase: 64 U/L (ref 38–126)
Anion gap: 12 (ref 5–15)
BUN: 15 mg/dL (ref 6–20)
CO2: 27 mmol/L (ref 22–32)
Calcium: 9.7 mg/dL (ref 8.9–10.3)
Chloride: 105 mmol/L (ref 98–111)
Creatinine, Ser: 1.04 mg/dL — ABNORMAL HIGH (ref 0.44–1.00)
GFR calc Af Amer: >60 mL/min (ref >60)
GFR calc non Af Amer: 58 mL/min — ABNORMAL LOW (ref >60)
Glucose, Bld: 91 mg/dL (ref 70–99)
Potassium: 4.4 mmol/L (ref 3.5–5.1)
Sodium: 144 mmol/L (ref 135–145)
Total Bilirubin: 0.3 mg/dL (ref 0.3–1.2)
Total Protein: 6.3 g/dL — ABNORMAL LOW (ref 6.5–8.1)

## 2018-10-15 MED ORDER — INFLUENZA VAC SPLIT QUAD 0.5 ML IM SUSY
0.5000 mL | PREFILLED_SYRINGE | Freq: Once | INTRAMUSCULAR | Status: AC
  Administered 2018-10-15: 0.5 mL via INTRAMUSCULAR

## 2018-10-15 MED ORDER — INFLUENZA VAC SPLIT QUAD 0.5 ML IM SUSY
PREFILLED_SYRINGE | INTRAMUSCULAR | Status: AC
  Filled 2018-10-15: qty 0.5

## 2018-10-15 NOTE — Assessment & Plan Note (Signed)
She has chronic mild thrombocytopenia with recent prednisone dose adjustment The goal of treatment is to get her platelet count well above 50 I plan to continue dose of prednisone to 5 mg daily The risk, benefits, side effects of staying on low-dose prednisone is fully discussed with the patient's caregiver and she agreed to proceed We discussed the importance of preventive care and reviewed the vaccination programs. She does not have any prior allergic reactions to influenza vaccination. She agrees to proceed with influenza vaccination today and we will administer it today at the clinic.

## 2018-10-15 NOTE — Telephone Encounter (Signed)
Gave avs and calendar ° °

## 2018-10-15 NOTE — Progress Notes (Signed)
[place] OFFICE PROGRESS NOTE  Heather Agreste, MD  ASSESSMENT & PLAN:  Acute ITP [facility]) She has chronic mild thrombocytopenia with recent prednisone dose adjustment The goal of treatment is to get her platelet count well above 50 I plan to continue dose of prednisone to 5 mg daily The risk, benefits, side effects of staying on low-dose prednisone is fully discussed with the patient's caregiver and she agreed to proceed We discussed the importance of preventive care and reviewed the vaccination programs. She does not have any prior allergic reactions to influenza vaccination. She agrees to proceed with influenza vaccination today and we will administer it today at the clinic.   Intellectual disability The patient has intellectual disability and is on multiple different medications I had extensive discussion with her caregiver Initially, she requested me to dictate a letter to summarize her overall health status After much discussion, she felt that my explanation is adequate to fulfill her needs for now   No orders of the defined types were placed in this encounter.   INTERVAL HISTORY: Heather [name] 57 y.o. female returns for further follow-up. She tolerated reduced dose prednisone well She has some questions in regards to her other medications The patient denies any recent signs or symptoms of bleeding such as spontaneous epistaxis, hematuria or hematochezia. Later, I spoke with Heather [name], her healthcare power of attorney about her conditions as well  SUMMARY OF HEMATOLOGIC HISTORY:  Heather [name] is here because of thrombocytopenia. She has mild mental retardation.  Her background history is not clear. She lives with Heather [name] for 4 years.  She was found to have abnormal CBC from recent blood work. She has normal CBC as of last year. Most recently, repeat CBC show mild leukopenia with a platelet count of 89,000.  That was subsequently repeated and it got  even lower to 69,000. She had discovered recent bruising but denies spontaneous epistaxis, hematuria, melena or hematochezia The patient denies history of liver disease, exposure to heparin, history of cardiac murmur/prior cardiovascular surgery or recent new medications She had received blood transfusion in the past. Although the patient denies a history of liver disease, ultrasound of the abdomen from April 2012 revealed fatty infiltration of the liver, suspicious for possible fatty liver disease versus other liver condition. She denies recent new medications.  She denies recent infection. Further workup revealed evidence of liver lesion on CT imaging and positive ANA screen. She was being observed from the hematology standpoint to progression of pancytopenia On 09/09/2018, she was started on 40 mg of prednisone daily On October 1st, 2019, the dose of prednisone is reduced to 20 mg daily On October 01, 2018, dose of prednisone is reduced to 10 mg daily On October 08, 2018, the dose of prednisone is reduced to 5 mg  I have reviewed the past medical history, past surgical history, social history and family history with the patient and they are unchanged from previous note.  ALLERGIES:  has no allergies on file.  MEDICATIONS:  Current Outpatient Medications  Medication Sig Dispense Refill  . desvenlafaxine (PRISTIQ) 50 MG 24 hr tablet Take 1 tablet (50 mg total) by mouth daily. 30 tablet 0  . methylphenidate (RITALIN) 10 MG tablet Take 1 tablet (10 mg total) by mouth every morning. 30 tablet 0  . predniSONE (DELTASONE) 10 MG tablet Take 1 tablet (10 mg total) by mouth daily with breakfast. 30 tablet 1  . triamcinolone cream (KENALOG) 0.1 % Apply thin layer to  affected area TID PRN itching 30 g 0   No current facility-administered medications for this visit.      REVIEW OF SYSTEMS:   Constitutional: Denies fevers, chills or night sweats Eyes: Denies blurriness of vision Ears, nose, mouth,  throat, and face: Denies mucositis or sore throat Respiratory: Denies cough, dyspnea or wheezes Cardiovascular: Denies palpitation, chest discomfort or lower extremity swelling Gastrointestinal:  Denies nausea, heartburn or change in bowel habits Skin: Denies abnormal skin rashes Lymphatics: Denies new lymphadenopathy or easy bruising Neurological:Denies numbness, tingling or new weaknesses Behavioral/Psych: Mood is stable, no new changes  All other systems were reviewed with the patient and are negative.  PHYSICAL EXAMINATION: ECOG PERFORMANCE STATUS: 0 - Asymptomatic  Vitals:   10/15/18 0850  BP: 112/63  Pulse: 85  Resp: 18  Temp: 98 F (36.7 C)  SpO2: 100%   Filed Weights   10/15/18 0850  Weight: 128 lb 6.4 oz (58.2 kg)    GENERAL:alert, no distress and comfortable Musculoskeletal:no cyanosis of digits and no clubbing  NEURO: alert & oriented x 3 with fluent speech, no focal motor/sensory deficits  LABORATORY DATA:  I have reviewed the data as listed     Component Value Date/Time   NA 144 10/15/2018 0823   NA 143 01/03/2018 1108   K 4.4 10/15/2018 0823   CL 105 10/15/2018 0823   CO2 27 10/15/2018 0823   GLUCOSE 91 10/15/2018 0823   BUN 15 10/15/2018 0823   BUN 19 01/03/2018 1108   CREATININE 1.04 (H) 10/15/2018 0823   CREATININE 1.01 (H) 09/09/2018 0913   CREATININE 0.85 08/22/2014 1031   CALCIUM 9.7 10/15/2018 0823   PROT 6.3 (L) 10/15/2018 0823   PROT 6.2 01/03/2018 1108   ALBUMIN 3.4 (L) 10/15/2018 0823   ALBUMIN 3.9 01/03/2018 1108   AST 15 10/15/2018 0823   AST 15 04/18/2018 1110   ALT 6 10/15/2018 0823   ALT <6 04/18/2018 1110   ALKPHOS 64 10/15/2018 0823   BILITOT 0.3 10/15/2018 0823   BILITOT 0.3 04/18/2018 1110   GFRNONAA 58 (L) 10/15/2018 0823   GFRNONAA 60 (L) 09/09/2018 0913   GFRAA >60 10/15/2018 0823   GFRAA >60 09/09/2018 0913    No results found for: SPEP, UPEP  Lab Results  Component Value Date   WBC 4.2 10/15/2018   NEUTROABS  2.0 10/15/2018   HGB 12.7 10/15/2018   HCT 40.1 10/15/2018   MCV 98.5 10/15/2018   PLT 93 (L) 10/15/2018      Chemistry      Component Value Date/Time   NA 144 10/15/2018 0823   NA 143 01/03/2018 1108   K 4.4 10/15/2018 0823   CL 105 10/15/2018 0823   CO2 27 10/15/2018 0823   BUN 15 10/15/2018 0823   BUN 19 01/03/2018 1108   CREATININE 1.04 (H) 10/15/2018 0823   CREATININE 1.01 (H) 09/09/2018 0913   CREATININE 0.85 08/22/2014 1031      Component Value Date/Time   CALCIUM 9.7 10/15/2018 0823   ALKPHOS 64 10/15/2018 0823   AST 15 10/15/2018 0823   AST 15 04/18/2018 1110   ALT 6 10/15/2018 0823   ALT <6 04/18/2018 1110   BILITOT 0.3 10/15/2018 0823   BILITOT 0.3 04/18/2018 1110      I spent 15 minutes counseling the patient face to face & discussed with her care givers. The total time spent in the appointment was 20 minutes and more than 50% was on counseling.   All questions were  answered. The patient knows to call the clinic with any problems, questions or concerns. No barriers to learning was detected.    [name], MD 10/22/20199:39 AM

## 2018-10-15 NOTE — Assessment & Plan Note (Signed)
The patient has intellectual disability and is on multiple different medications I had extensive discussion with her caregiver Initially, she requested me to dictate a letter to summarize her overall health status After much discussion, she felt that my explanation is adequate to fulfill her needs for now

## 2018-10-25 ENCOUNTER — Ambulatory Visit: Payer: Medicare Other | Admitting: Hematology and Oncology

## 2018-10-25 ENCOUNTER — Other Ambulatory Visit: Payer: Medicare Other

## 2018-11-15 ENCOUNTER — Encounter: Payer: Self-pay | Admitting: Family Medicine

## 2018-11-15 DIAGNOSIS — M8589 Other specified disorders of bone density and structure, multiple sites: Secondary | ICD-10-CM | POA: Diagnosis not present

## 2018-11-15 DIAGNOSIS — Z1231 Encounter for screening mammogram for malignant neoplasm of breast: Secondary | ICD-10-CM | POA: Diagnosis not present

## 2018-11-29 ENCOUNTER — Encounter: Payer: Self-pay | Admitting: Hematology and Oncology

## 2018-11-29 ENCOUNTER — Other Ambulatory Visit: Payer: Self-pay | Admitting: Hematology and Oncology

## 2018-11-29 ENCOUNTER — Inpatient Hospital Stay (HOSPITAL_BASED_OUTPATIENT_CLINIC_OR_DEPARTMENT_OTHER): Payer: Medicare Other | Admitting: Hematology and Oncology

## 2018-11-29 ENCOUNTER — Telehealth: Payer: Self-pay | Admitting: Hematology and Oncology

## 2018-11-29 ENCOUNTER — Inpatient Hospital Stay: Payer: Medicare Other | Attending: Hematology and Oncology

## 2018-11-29 DIAGNOSIS — D693 Immune thrombocytopenic purpura: Secondary | ICD-10-CM | POA: Insufficient documentation

## 2018-11-29 DIAGNOSIS — D61818 Other pancytopenia: Secondary | ICD-10-CM

## 2018-11-29 DIAGNOSIS — F79 Unspecified intellectual disabilities: Secondary | ICD-10-CM | POA: Insufficient documentation

## 2018-11-29 LAB — CBC WITH DIFFERENTIAL/PLATELET
Abs Immature Granulocytes: 0.02 10*3/uL (ref 0.00–0.07)
Basophils Absolute: 0 10*3/uL (ref 0.0–0.1)
Basophils Relative: 0 %
Eosinophils Absolute: 0 10*3/uL (ref 0.0–0.5)
Eosinophils Relative: 0 %
HCT: 39.6 % (ref 36.0–46.0)
Hemoglobin: 12.7 g/dL (ref 12.0–15.0)
Immature Granulocytes: 0 %
Lymphocytes Relative: 25 %
Lymphs Abs: 1.4 10*3/uL (ref 0.7–4.0)
MCH: 31.4 pg (ref 26.0–34.0)
MCHC: 32.1 g/dL (ref 30.0–36.0)
MCV: 98 fL (ref 80.0–100.0)
Monocytes Absolute: 0.3 10*3/uL (ref 0.1–1.0)
Monocytes Relative: 6 %
Neutro Abs: 3.8 10*3/uL (ref 1.7–7.7)
Neutrophils Relative %: 69 %
Platelets: 94 10*3/uL — ABNORMAL LOW (ref 150–400)
RBC: 4.04 MIL/uL (ref 3.87–5.11)
RDW: 13.2 % (ref 11.5–15.5)
WBC: 5.5 10*3/uL (ref 4.0–10.5)
nRBC: 0 % (ref 0.0–0.2)

## 2018-11-29 MED ORDER — PREDNISONE 5 MG PO TABS: 5.0000 mg | ORAL_TABLET | Freq: Every day | ORAL | 11 refills | Status: DC

## 2018-11-29 NOTE — Telephone Encounter (Signed)
Gave patient and caregiver avs and calendar.

## 2018-11-29 NOTE — Assessment & Plan Note (Signed)
She has stable platelet count I discussed with caregiver the risk and benefits of continuing on 5 mg prednisone versus slow taper For now, were comfortable keeping her on 5 mg daily I plan to see her back in 3 months

## 2018-11-29 NOTE — Assessment & Plan Note (Signed)
The patient has intellectual disability She is not able to make medical decision I have called and spoken with her caregiver and she agree with the plan as discussed above.

## 2018-11-29 NOTE — Progress Notes (Signed)
[facility] [name] OFFICE PROGRESS NOTE  Heather Agreste, MD  ASSESSMENT & PLAN:  Acute ITP Heather [name]) She has stable platelet count I discussed with caregiver the risk and benefits of continuing on 5 mg prednisone versus slow taper For now, were comfortable keeping her on 5 mg daily I plan to see her back in 3 months  Intellectual disability The patient has intellectual disability She is not able to make medical decision I have called and spoken with her caregiver and she agree with the plan as discussed above.   No orders of the defined types were placed in this encounter.   INTERVAL HISTORY: [name] 57 y.o. female returns for further follow-up. She is doing well.  No recent bleeding Denies recent infection, fever or chills  SUMMARY OF HEMATOLOGIC HISTORY:  [place] is here because of thrombocytopenia. She has mild mental retardation.  Her background history is not clear. She lives with Heather [name] for 4 years.  She was found to have abnormal CBC from recent blood work. She has normal CBC as of last year. Most recently, repeat CBC show mild leukopenia with a platelet count of 89,000.  That was subsequently repeated and it got even lower to 69,000. She had discovered recent bruising but denies spontaneous epistaxis, hematuria, melena or hematochezia The patient denies history of liver disease, exposure to heparin, history of cardiac murmur/prior cardiovascular surgery or recent new medications She had received blood transfusion in the past. Although the patient denies a history of liver disease, ultrasound of the abdomen from April 2012 revealed fatty infiltration of the liver, suspicious for possible fatty liver disease versus other liver condition. She denies recent new medications.  She denies recent infection. Further workup revealed evidence of liver lesion on CT imaging and positive ANA screen. She was being observed from the hematology standpoint to  progression of pancytopenia On 09/09/2018, she was started on 40 mg of prednisone daily On October 1st, 2019, the dose of prednisone is reduced to 20 mg daily On October 01, 2018, dose of prednisone is reduced to 10 mg daily On October 08, 2018, the dose of prednisone is reduced to 5 mg  I have reviewed the past medical history, past surgical history, social history and family history with the patient and they are unchanged from previous note.  ALLERGIES:  has no allergies on file.  MEDICATIONS:  Current Outpatient Medications  Medication Sig Dispense Refill  . desvenlafaxine (PRISTIQ) 50 MG 24 hr tablet Take 1 tablet (50 mg total) by mouth daily. 30 tablet 0  . methylphenidate (RITALIN) 10 MG tablet Take 1 tablet (10 mg total) by mouth every morning. 30 tablet 0  . predniSONE (DELTASONE) 5 MG tablet Take 1 tablet (5 mg total) by mouth daily with breakfast. 30 tablet 11  . triamcinolone cream (KENALOG) 0.1 % Apply thin layer to affected area TID PRN itching 30 g 0   No current facility-administered medications for this visit.      REVIEW OF SYSTEMS:   Constitutional: Denies fevers, chills or night sweats Eyes: Denies blurriness of vision Ears, nose, mouth, throat, and face: Denies mucositis or sore throat Respiratory: Denies cough, dyspnea or wheezes Cardiovascular: Denies palpitation, chest discomfort or lower extremity swelling Gastrointestinal:  Denies nausea, heartburn or change in bowel habits Skin: Denies abnormal skin rashes Lymphatics: Denies new lymphadenopathy or easy bruising Neurological:Denies numbness, tingling or new weaknesses Behavioral/Psych: Mood is stable, no new changes  All other systems were reviewed with the  patient and are negative.  PHYSICAL EXAMINATION: ECOG PERFORMANCE STATUS: 0 - Asymptomatic  Vitals:   11/29/18 1010  BP: 122/68  Pulse: 74  Resp: 18  Temp: 98.4 F (36.9 C)  SpO2: 100%   Filed Weights   11/29/18 1010  Weight: 128 lb 3.2 oz  (58.2 kg)    GENERAL:alert, no distress and comfortable SKIN: skin color, texture, turgor are normal, no rashes or significant lesions EYES: normal, Conjunctiva are pink and non-injected, sclera clear OROPHARYNX:no exudate, no erythema and lips, buccal mucosa, and tongue normal  NECK: supple, thyroid normal size, non-tender, without nodularity LYMPH:  no palpable lymphadenopathy in the cervical, axillary or inguinal LUNGS: clear to auscultation and percussion with normal breathing effort HEART: regular rate & rhythm and no murmurs and no lower extremity edema ABDOMEN:abdomen soft, non-tender and normal bowel sounds Musculoskeletal:no cyanosis of digits and no clubbing  NEURO: alert & oriented x 3 with fluent speech, no focal motor/sensory deficits  LABORATORY DATA:  I have reviewed the data as listed     Component Value Date/Time   NA 144 10/15/2018 0823   NA 143 01/03/2018 1108   K 4.4 10/15/2018 0823   CL 105 10/15/2018 0823   CO2 27 10/15/2018 0823   GLUCOSE 91 10/15/2018 0823   BUN 15 10/15/2018 0823   BUN 19 01/03/2018 1108   CREATININE 1.04 (H) 10/15/2018 0823   CREATININE 1.01 (H) 09/09/2018 0913   CREATININE 0.85 08/22/2014 1031   CALCIUM 9.7 10/15/2018 0823   PROT 6.3 (L) 10/15/2018 0823   PROT 6.2 01/03/2018 1108   ALBUMIN 3.4 (L) 10/15/2018 0823   ALBUMIN 3.9 01/03/2018 1108   AST 15 10/15/2018 0823   AST 15 04/18/2018 1110   ALT 6 10/15/2018 0823   ALT <6 04/18/2018 1110   ALKPHOS 64 10/15/2018 0823   BILITOT 0.3 10/15/2018 0823   BILITOT 0.3 04/18/2018 1110   GFRNONAA 58 (L) 10/15/2018 0823   GFRNONAA 60 (L) 09/09/2018 0913   GFRAA >60 10/15/2018 0823   GFRAA >60 09/09/2018 0913    No results found for: SPEP, UPEP  Lab Results  Component Value Date   WBC 5.5 11/29/2018   NEUTROABS 3.8 11/29/2018   HGB 12.7 11/29/2018   HCT 39.6 11/29/2018   MCV 98.0 11/29/2018   PLT 94 (L) 11/29/2018      Chemistry      Component Value Date/Time   NA 144  10/15/2018 0823   NA 143 01/03/2018 1108   K 4.4 10/15/2018 0823   CL 105 10/15/2018 0823   CO2 27 10/15/2018 0823   BUN 15 10/15/2018 0823   BUN 19 01/03/2018 1108   CREATININE 1.04 (H) 10/15/2018 0823   CREATININE 1.01 (H) 09/09/2018 0913   CREATININE 0.85 08/22/2014 1031      Component Value Date/Time   CALCIUM 9.7 10/15/2018 0823   ALKPHOS 64 10/15/2018 0823   AST 15 10/15/2018 0823   AST 15 04/18/2018 1110   ALT 6 10/15/2018 0823   ALT <6 04/18/2018 1110   BILITOT 0.3 10/15/2018 0823   BILITOT 0.3 04/18/2018 1110     I spent 10 minutes counseling the patient face to face. The total time spent in the appointment was 15 minutes and more than 50% was on counseling.   All questions were answered. The patient knows to call the clinic with any problems, questions or concerns. No barriers to learning was detected.    [name], MD 12/6/20192:34 PM

## 2018-12-01 ENCOUNTER — Other Ambulatory Visit: Payer: Self-pay | Admitting: Hematology and Oncology

## 2018-12-13 ENCOUNTER — Telehealth: Payer: Self-pay | Admitting: Family Medicine

## 2018-12-13 NOTE — Telephone Encounter (Signed)
Please call patient.  Make sure she received the results from her bone density testing on November 25.  It indicated osteopenia but not true osteoporosis.  Make sure to take in sufficient calcium and vitamin D with repeat testing in 2 years.  Let me know if there are questions

## 2018-12-16 NOTE — Telephone Encounter (Signed)
I have called the pt and informed her of the message from the provider.  Sh stated understanding.  Thanks, [employer]

## 2018-12-16 NOTE — Telephone Encounter (Signed)
Pt returned VM. No one available to take her call. Please reach back out to pt. [phone]

## 2018-12-16 NOTE — Telephone Encounter (Signed)
I have attempted to call pt to relay provider information below there was no answer a general message was left to call back.

## 2019-02-22 ENCOUNTER — Encounter: Payer: Self-pay | Admitting: Gastroenterology

## 2019-02-26 ENCOUNTER — Telehealth: Payer: Self-pay | Admitting: Hematology and Oncology

## 2019-02-26 NOTE — Telephone Encounter (Signed)
Per sch msg, patient needs to reschedule appt. Spoke with patients caretaker, [name]. Moved 3/9 to 3/12. Confirmed date and time

## 2019-03-03 ENCOUNTER — Ambulatory Visit: Payer: Medicare Other | Admitting: Hematology and Oncology

## 2019-03-03 ENCOUNTER — Other Ambulatory Visit: Payer: Medicare Other

## 2019-03-06 ENCOUNTER — Inpatient Hospital Stay: Payer: Medicare Other | Attending: Hematology and Oncology

## 2019-03-06 ENCOUNTER — Encounter: Payer: Self-pay | Admitting: Hematology and Oncology

## 2019-03-06 ENCOUNTER — Other Ambulatory Visit: Payer: Self-pay

## 2019-03-06 ENCOUNTER — Inpatient Hospital Stay (HOSPITAL_BASED_OUTPATIENT_CLINIC_OR_DEPARTMENT_OTHER): Payer: Medicare Other | Admitting: Hematology and Oncology

## 2019-03-06 DIAGNOSIS — M8580 Other specified disorders of bone density and structure, unspecified site: Secondary | ICD-10-CM | POA: Insufficient documentation

## 2019-03-06 DIAGNOSIS — F70 Mild intellectual disabilities: Secondary | ICD-10-CM | POA: Insufficient documentation

## 2019-03-06 DIAGNOSIS — D61818 Other pancytopenia: Secondary | ICD-10-CM

## 2019-03-06 DIAGNOSIS — F79 Unspecified intellectual disabilities: Secondary | ICD-10-CM | POA: Diagnosis not present

## 2019-03-06 LAB — CBC WITH DIFFERENTIAL/PLATELET
Abs Immature Granulocytes: 0.02 10*3/uL (ref 0.00–0.07)
Basophils Absolute: 0 10*3/uL (ref 0.0–0.1)
Basophils Relative: 0 %
Eosinophils Absolute: 0 10*3/uL (ref 0.0–0.5)
Eosinophils Relative: 0 %
HCT: 39.9 % (ref 36.0–46.0)
Hemoglobin: 12.4 g/dL (ref 12.0–15.0)
Immature Granulocytes: 0 %
Lymphocytes Relative: 9 %
Lymphs Abs: 0.5 10*3/uL — ABNORMAL LOW (ref 0.7–4.0)
MCH: 31.3 pg (ref 26.0–34.0)
MCHC: 31.1 g/dL (ref 30.0–36.0)
MCV: 100.8 fL — ABNORMAL HIGH (ref 80.0–100.0)
Monocytes Absolute: 0.2 10*3/uL (ref 0.1–1.0)
Monocytes Relative: 4 %
Neutro Abs: 4.5 10*3/uL (ref 1.7–7.7)
Neutrophils Relative %: 87 %
Platelets: 93 10*3/uL — ABNORMAL LOW (ref 150–400)
RBC: 3.96 MIL/uL (ref 3.87–5.11)
RDW: 12.6 % (ref 11.5–15.5)
WBC: 5.2 10*3/uL (ref 4.0–10.5)
nRBC: 0 % (ref 0.0–0.2)

## 2019-03-06 MED ORDER — PREDNISONE 5 MG PO TABS: 2.5000 mg | ORAL_TABLET | Freq: Every day | ORAL | 1 refills | Status: DC

## 2019-03-06 NOTE — Progress Notes (Signed)
[place] OFFICE PROGRESS NOTE  [name], MD  ASSESSMENT & PLAN:  Pancytopenia, acquired [facility]) She has stable platelet count I discussed with caregiver the risk and benefits of prednisone taper  I recommend 2.5 mg daily and I plan to see her back in about 2 weeks for further follow-up  Osteopenia She has osteopenia and unable to continue weightbearing exercise I recommend vitamin D 2000 units daily to avoid osteoporosis due to long-term steroid therapy   No orders of the defined types were placed in this encounter.   INTERVAL HISTORY: [name] 58 y.o. female returns for further follow-up with her caregiver She denies recent infection, fever or chills The patient denies any recent signs or symptoms of bleeding such as spontaneous epistaxis, hematuria or hematochezia.  SUMMARY OF HEMATOLOGIC HISTORY:  [name] is here because of thrombocytopenia. She has mild mental retardation.  Her background history is not clear. She lives with [name] for 4 years.  She was found to have abnormal CBC from recent blood work. She has normal CBC as of last year. Most recently, repeat CBC show mild leukopenia with a platelet count of 89,000.  That was subsequently repeated and it got even lower to 69,000. She had discovered recent bruising but denies spontaneous epistaxis, hematuria, melena or hematochezia The patient denies history of liver disease, exposure to heparin, history of cardiac murmur/prior cardiovascular surgery or recent new medications She had received blood transfusion in the past. Although the patient denies a history of liver disease, ultrasound of the abdomen from April 2012 revealed fatty infiltration of the liver, suspicious for possible fatty liver disease versus other liver condition. She denies recent new medications.  She denies recent infection. Further workup revealed evidence of liver lesion on CT imaging and positive ANA  screen. She was being observed from the hematology standpoint to progression of pancytopenia On 09/09/2018, she was started on 40 mg of prednisone daily On October 1st, 2019, the dose of prednisone is reduced to 20 mg daily On October 01, 2018, dose of prednisone is reduced to 10 mg daily On October 08, 2018, the dose of prednisone is reduced to 5 mg Bone density scan in November 2019 showed osteopenia with T score of -2.4 On March 06, 2019, the dose of prednisone is reduced to 2.5 mg daily  I have reviewed the past medical history, past surgical history, social history and family history with the patient and they are unchanged from previous note.  ALLERGIES:  has no allergies on file.  MEDICATIONS:  Current Outpatient Medications  Medication Sig Dispense Refill  . desvenlafaxine (PRISTIQ) 50 MG 24 hr tablet Take 1 tablet (50 mg total) by mouth daily. 30 tablet 0  . methylphenidate (RITALIN) 10 MG tablet Take 1 tablet (10 mg total) by mouth every morning. 30 tablet 0  . predniSONE (DELTASONE) 5 MG tablet Take 0.5 tablets (2.5 mg total) by mouth daily with breakfast. 90 tablet 1  . triamcinolone cream (KENALOG) 0.1 % Apply thin layer to affected area TID PRN itching 30 g 0   No current facility-administered medications for this visit.      REVIEW OF SYSTEMS:   Constitutional: Denies fevers, chills or night sweats Eyes: Denies blurriness of vision Ears, nose, mouth, throat, and face: Denies mucositis or sore throat Respiratory: Denies cough, dyspnea or wheezes Cardiovascular: Denies palpitation, chest discomfort or lower extremity swelling Gastrointestinal:  Denies nausea, heartburn or change in bowel habits Skin: Denies abnormal skin rashes  Lymphatics: Denies new lymphadenopathy or easy bruising Neurological:Denies numbness, tingling or new weaknesses Behavioral/Psych: Mood is stable, no new changes  All other systems were reviewed with the patient and are negative.  PHYSICAL  EXAMINATION: ECOG PERFORMANCE STATUS: 0 - Asymptomatic  Vitals:   03/06/19 1252  BP: (!) 103/49  Pulse: 80  Resp: 18  Temp: 98.1 F (36.7 C)  SpO2: 100%   Filed Weights   03/06/19 1252  Weight: 126 lb 12.8 oz (57.5 kg)    GENERAL:alert, no distress and comfortable NEURO: alert & oriented x 3 with fluent speech, no focal motor/sensory deficits  LABORATORY DATA:  I have reviewed the data as listed     Component Value Date/Time   NA 144 10/15/2018 0823   NA 143 01/03/2018 1108   K 4.4 10/15/2018 0823   CL 105 10/15/2018 0823   CO2 27 10/15/2018 0823   GLUCOSE 91 10/15/2018 0823   BUN 15 10/15/2018 0823   BUN 19 01/03/2018 1108   CREATININE 1.04 (H) 10/15/2018 0823   CREATININE 1.01 (H) 09/09/2018 0913   CREATININE 0.85 08/22/2014 1031   CALCIUM 9.7 10/15/2018 0823   PROT 6.3 (L) 10/15/2018 0823   PROT 6.2 01/03/2018 1108   ALBUMIN 3.4 (L) 10/15/2018 0823   ALBUMIN 3.9 01/03/2018 1108   AST 15 10/15/2018 0823   AST 15 04/18/2018 1110   ALT 6 10/15/2018 0823   ALT <6 04/18/2018 1110   ALKPHOS 64 10/15/2018 0823   BILITOT 0.3 10/15/2018 0823   BILITOT 0.3 04/18/2018 1110   GFRNONAA 58 (L) 10/15/2018 0823   GFRNONAA 60 (L) 09/09/2018 0913   GFRAA >60 10/15/2018 0823   GFRAA >60 09/09/2018 0913    No results found for: SPEP, UPEP  Lab Results  Component Value Date   WBC 5.2 03/06/2019   NEUTROABS 4.5 03/06/2019   HGB 12.4 03/06/2019   HCT 39.9 03/06/2019   MCV 100.8 (H) 03/06/2019   PLT 93 (L) 03/06/2019      Chemistry      Component Value Date/Time   NA 144 10/15/2018 0823   NA 143 01/03/2018 1108   K 4.4 10/15/2018 0823   CL 105 10/15/2018 0823   CO2 27 10/15/2018 0823   BUN 15 10/15/2018 0823   BUN 19 01/03/2018 1108   CREATININE 1.04 (H) 10/15/2018 0823   CREATININE 1.01 (H) 09/09/2018 0913   CREATININE 0.85 08/22/2014 1031      Component Value Date/Time   CALCIUM 9.7 10/15/2018 0823   ALKPHOS 64 10/15/2018 0823   AST 15 10/15/2018 0823    AST 15 04/18/2018 1110   ALT 6 10/15/2018 0823   ALT <6 04/18/2018 1110   BILITOT 0.3 10/15/2018 0823   BILITOT 0.3 04/18/2018 1110      I spent 10 minutes counseling the patient face to face. The total time spent in the appointment was 15 minutes and more than 50% was on counseling.   All questions were answered. The patient knows to call the clinic with any problems, questions or concerns. No barriers to learning was detected.    [name], MD 3/12/20202:32 PM

## 2019-03-06 NOTE — Assessment & Plan Note (Signed)
She has stable platelet count I discussed with caregiver the risk and benefits of prednisone taper  I recommend 2.5 mg daily and I plan to see her back in about 2 weeks for further follow-up

## 2019-03-06 NOTE — Assessment & Plan Note (Signed)
She has osteopenia and unable to continue weightbearing exercise I recommend vitamin D 2000 units daily to avoid osteoporosis due to long-term steroid therapy

## 2019-03-07 ENCOUNTER — Telehealth: Payer: Self-pay | Admitting: Hematology and Oncology

## 2019-03-07 NOTE — Telephone Encounter (Signed)
Tried to reach regarding 3/26

## 2019-03-11 ENCOUNTER — Telehealth: Payer: Self-pay | Admitting: Hematology and Oncology

## 2019-03-11 NOTE — Telephone Encounter (Signed)
Patients care taker called to reschedule

## 2019-03-20 ENCOUNTER — Other Ambulatory Visit: Payer: Medicare Other

## 2019-03-20 ENCOUNTER — Ambulatory Visit: Payer: Medicare Other | Admitting: Hematology and Oncology

## 2019-03-24 ENCOUNTER — Inpatient Hospital Stay: Payer: Medicare Other

## 2019-03-24 ENCOUNTER — Other Ambulatory Visit: Payer: Self-pay

## 2019-03-24 ENCOUNTER — Inpatient Hospital Stay (HOSPITAL_BASED_OUTPATIENT_CLINIC_OR_DEPARTMENT_OTHER): Payer: Medicare Other | Admitting: Hematology and Oncology

## 2019-03-24 ENCOUNTER — Telehealth: Payer: Self-pay | Admitting: Hematology and Oncology

## 2019-03-24 ENCOUNTER — Encounter: Payer: Self-pay | Admitting: Hematology and Oncology

## 2019-03-24 DIAGNOSIS — D61818 Other pancytopenia: Secondary | ICD-10-CM | POA: Diagnosis not present

## 2019-03-24 DIAGNOSIS — F70 Mild intellectual disabilities: Secondary | ICD-10-CM

## 2019-03-24 DIAGNOSIS — M8580 Other specified disorders of bone density and structure, unspecified site: Secondary | ICD-10-CM | POA: Diagnosis not present

## 2019-03-24 LAB — CBC WITH DIFFERENTIAL/PLATELET
Abs Immature Granulocytes: 0.01 10*3/uL (ref 0.00–0.07)
Basophils Absolute: 0 10*3/uL (ref 0.0–0.1)
Basophils Relative: 0 %
Eosinophils Absolute: 0 10*3/uL (ref 0.0–0.5)
Eosinophils Relative: 0 %
HCT: 39.5 % (ref 36.0–46.0)
Hemoglobin: 12.3 g/dL (ref 12.0–15.0)
Immature Granulocytes: 0 %
Lymphocytes Relative: 48 %
Lymphs Abs: 1.7 10*3/uL (ref 0.7–4.0)
MCH: 31.5 pg (ref 26.0–34.0)
MCHC: 31.1 g/dL (ref 30.0–36.0)
MCV: 101 fL — ABNORMAL HIGH (ref 80.0–100.0)
Monocytes Absolute: 0.3 10*3/uL (ref 0.1–1.0)
Monocytes Relative: 7 %
Neutro Abs: 1.6 10*3/uL — ABNORMAL LOW (ref 1.7–7.7)
Neutrophils Relative %: 45 %
Platelets: 80 10*3/uL — ABNORMAL LOW (ref 150–400)
RBC: 3.91 MIL/uL (ref 3.87–5.11)
RDW: 12.7 % (ref 11.5–15.5)
WBC: 3.5 10*3/uL — ABNORMAL LOW (ref 4.0–10.5)
nRBC: 0 % (ref 0.0–0.2)

## 2019-03-24 MED ORDER — PREDNISONE 5 MG PO TABS: ORAL_TABLET | ORAL | 1 refills | Status: DC

## 2019-03-24 NOTE — Assessment & Plan Note (Signed)
She has mild worsening pancytopenia with prednisone taper I recommend changing her prednisone dose again to 2.5 mg alternate with 5 mg every other day I recommend spacing out her appointment to every 3 to 6 months I will see her back in 4 months We discussed signs and symptoms to watch out for bleeding The goal is just to keep her platelet count above 50,000

## 2019-03-24 NOTE — Patient Instructions (Signed)
Reduce prednisone to 2.5 mg alternate with 5 mg every other day

## 2019-03-24 NOTE — Telephone Encounter (Signed)
Tried to call regarding 7/30 I did leave a message

## 2019-03-24 NOTE — Progress Notes (Signed)
[place] OFFICE PROGRESS NOTE  Heather Agreste, MD  ASSESSMENT & PLAN:  Pancytopenia, acquired [facility]) She has mild worsening pancytopenia with prednisone taper I recommend changing her prednisone dose again to 2.5 mg alternate with 5 mg every other day I recommend spacing out her appointment to every 3 to 6 months I will see her back in 4 months We discussed signs and symptoms to watch out for bleeding The goal is just to keep her platelet count above 50,000   No orders of the defined types were placed in this encounter.   INTERVAL HISTORY: Heather [name] 58 y.o. female returns for follow-up with her caregiver The patient is asymptomatic She denies new recent infection, bleeding or skin rash  SUMMARY OF HEMATOLOGIC HISTORY:  Heather [name] is here because of thrombocytopenia. She has mild mental retardation.  Her background history is not clear. She lives with Heather [name] for 4 years.  She was found to have abnormal CBC from recent blood work. She has normal CBC as of last year. Most recently, repeat CBC show mild leukopenia with a platelet count of 89,000.  That was subsequently repeated and it got even lower to 69,000. She had discovered recent bruising but denies spontaneous epistaxis, hematuria, melena or hematochezia The patient denies history of liver disease, exposure to heparin, history of cardiac murmur/prior cardiovascular surgery or recent new medications She had received blood transfusion in the past. Although the patient denies a history of liver disease, ultrasound of the abdomen from April 2012 revealed fatty infiltration of the liver, suspicious for possible fatty liver disease versus other liver condition. She denies recent new medications.  She denies recent infection. Further workup revealed evidence of liver lesion on CT imaging and positive ANA screen. She was being observed from the hematology standpoint to progression of pancytopenia On  09/09/2018, she was started on 40 mg of prednisone daily On October 1st, 2019, the dose of prednisone is reduced to 20 mg daily On October 01, 2018, dose of prednisone is reduced to 10 mg daily On October 08, 2018, the dose of prednisone is reduced to 5 mg Bone density scan in November 2019 showed osteopenia with T score of -2.4 On March 06, 2019, the dose of prednisone is reduced to 2.5 mg daily On March 24, 2019, the dose of prednisone is adjusted to 2.5 mg alternate with 5 mg every other day  I have reviewed the past medical history, past surgical history, social history and family history with the patient and they are unchanged from previous note.  ALLERGIES:  has no allergies on file.  MEDICATIONS:  Current Outpatient Medications  Medication Sig Dispense Refill  . desvenlafaxine (PRISTIQ) 50 MG 24 hr tablet Take 1 tablet (50 mg total) by mouth daily. 30 tablet 0  . methylphenidate (RITALIN) 10 MG tablet Take 1 tablet (10 mg total) by mouth every morning. 30 tablet 0  . predniSONE (DELTASONE) 5 MG tablet Take 5 mg alternate with 2.5 mg every other day 90 tablet 1  . triamcinolone cream (KENALOG) 0.1 % Apply thin layer to affected area TID PRN itching 30 g 0   No current facility-administered medications for this visit.      REVIEW OF SYSTEMS:   Constitutional: Denies fevers, chills or night sweats Eyes: Denies blurriness of vision Ears, nose, mouth, throat, and face: Denies mucositis or sore throat Respiratory: Denies cough, dyspnea or wheezes Cardiovascular: Denies palpitation, chest discomfort or lower extremity swelling Gastrointestinal:  Denies nausea,  heartburn or change in bowel habits Skin: Denies abnormal skin rashes Lymphatics: Denies new lymphadenopathy or easy bruising Neurological:Denies numbness, tingling or new weaknesses Behavioral/Psych: Mood is stable, no new changes  All other systems were reviewed with the patient and are negative.  PHYSICAL EXAMINATION: ECOG  PERFORMANCE STATUS: 0 - Asymptomatic  Vitals:   03/24/19 0923  BP: (!) 107/55  Pulse: 85  Resp: 18  Temp: 98.3 F (36.8 C)  SpO2: 100%   Filed Weights   03/24/19 0923  Weight: 125 lb 12.8 oz (57.1 kg)    GENERAL:alert, no distress and comfortable NEURO: alert & oriented x 3 with fluent speech, no focal motor/sensory deficits  LABORATORY DATA:  I have reviewed the data as listed     Component Value Date/Time   NA 144 10/15/2018 0823   NA 143 01/03/2018 1108   K 4.4 10/15/2018 0823   CL 105 10/15/2018 0823   CO2 27 10/15/2018 0823   GLUCOSE 91 10/15/2018 0823   BUN 15 10/15/2018 0823   BUN 19 01/03/2018 1108   CREATININE 1.04 (H) 10/15/2018 0823   CREATININE 1.01 (H) 09/09/2018 0913   CREATININE 0.85 08/22/2014 1031   CALCIUM 9.7 10/15/2018 0823   PROT 6.3 (L) 10/15/2018 0823   PROT 6.2 01/03/2018 1108   ALBUMIN 3.4 (L) 10/15/2018 0823   ALBUMIN 3.9 01/03/2018 1108   AST 15 10/15/2018 0823   AST 15 04/18/2018 1110   ALT 6 10/15/2018 0823   ALT <6 04/18/2018 1110   ALKPHOS 64 10/15/2018 0823   BILITOT 0.3 10/15/2018 0823   BILITOT 0.3 04/18/2018 1110   GFRNONAA 58 (L) 10/15/2018 0823   GFRNONAA 60 (L) 09/09/2018 0913   GFRAA >60 10/15/2018 0823   GFRAA >60 09/09/2018 0913    No results found for: SPEP, UPEP  Lab Results  Component Value Date   WBC 3.5 (L) 03/24/2019   NEUTROABS 1.6 (L) 03/24/2019   HGB 12.3 03/24/2019   HCT 39.5 03/24/2019   MCV 101.0 (H) 03/24/2019   PLT 80 (L) 03/24/2019      Chemistry      Component Value Date/Time   NA 144 10/15/2018 0823   NA 143 01/03/2018 1108   K 4.4 10/15/2018 0823   CL 105 10/15/2018 0823   CO2 27 10/15/2018 0823   BUN 15 10/15/2018 0823   BUN 19 01/03/2018 1108   CREATININE 1.04 (H) 10/15/2018 0823   CREATININE 1.01 (H) 09/09/2018 0913   CREATININE 0.85 08/22/2014 1031      Component Value Date/Time   CALCIUM 9.7 10/15/2018 0823   ALKPHOS 64 10/15/2018 0823   AST 15 10/15/2018 0823   AST 15  04/18/2018 1110   ALT 6 10/15/2018 0823   ALT <6 04/18/2018 1110   BILITOT 0.3 10/15/2018 0823   BILITOT 0.3 04/18/2018 1110       I spent 10 minutes counseling the patient face to face. The total time spent in the appointment was 15 minutes and more than 50% was on counseling.   All questions were answered. The patient knows to call the clinic with any problems, questions or concerns. No barriers to learning was detected.    [name], MD 3/30/202011:37 AM

## 2019-04-03 ENCOUNTER — Telehealth: Payer: Self-pay

## 2019-04-03 NOTE — Telephone Encounter (Signed)
[name], caregiver, called to report pt c/o pain in left calf now in left shin.  No swelling, coolness, or discoloration.   Per Dr Alvy Bimler not related to pancytopenia. [name] states pt usually swims 3 x a week and has not been able to in last 3 weeks. Advised [name] that pain could be r/t lack of muscle use.  Also instructed her to look for signs of blood clot.  Per Dr Alvy Bimler pt would need to be evaluated by her PCP.  [name] verbalizes understanding of above.

## 2019-04-11 DIAGNOSIS — Z131 Encounter for screening for diabetes mellitus: Secondary | ICD-10-CM | POA: Diagnosis not present

## 2019-04-11 DIAGNOSIS — Z1389 Encounter for screening for other disorder: Secondary | ICD-10-CM | POA: Diagnosis not present

## 2019-04-11 DIAGNOSIS — R3129 Other microscopic hematuria: Secondary | ICD-10-CM | POA: Diagnosis not present

## 2019-04-11 DIAGNOSIS — Z8673 Personal history of transient ischemic attack (TIA), and cerebral infarction without residual deficits: Secondary | ICD-10-CM | POA: Diagnosis not present

## 2019-04-11 DIAGNOSIS — M79605 Pain in left leg: Secondary | ICD-10-CM | POA: Diagnosis not present

## 2019-04-16 DIAGNOSIS — I69319 Unspecified symptoms and signs involving cognitive functions following cerebral infarction: Secondary | ICD-10-CM | POA: Diagnosis not present

## 2019-04-16 DIAGNOSIS — M4186 Other forms of scoliosis, lumbar region: Secondary | ICD-10-CM | POA: Diagnosis not present

## 2019-04-16 DIAGNOSIS — E782 Mixed hyperlipidemia: Secondary | ICD-10-CM | POA: Diagnosis not present

## 2019-04-16 DIAGNOSIS — M79605 Pain in left leg: Secondary | ICD-10-CM | POA: Diagnosis not present

## 2019-04-18 DIAGNOSIS — M5432 Sciatica, left side: Secondary | ICD-10-CM | POA: Diagnosis not present

## 2019-05-09 DIAGNOSIS — I69319 Unspecified symptoms and signs involving cognitive functions following cerebral infarction: Secondary | ICD-10-CM | POA: Diagnosis not present

## 2019-05-09 DIAGNOSIS — M79605 Pain in left leg: Secondary | ICD-10-CM | POA: Diagnosis not present

## 2019-05-09 DIAGNOSIS — E782 Mixed hyperlipidemia: Secondary | ICD-10-CM | POA: Diagnosis not present

## 2019-05-09 DIAGNOSIS — M4186 Other forms of scoliosis, lumbar region: Secondary | ICD-10-CM | POA: Diagnosis not present

## 2019-06-13 DIAGNOSIS — E782 Mixed hyperlipidemia: Secondary | ICD-10-CM | POA: Diagnosis not present

## 2019-06-13 DIAGNOSIS — M79605 Pain in left leg: Secondary | ICD-10-CM | POA: Diagnosis not present

## 2019-06-13 DIAGNOSIS — I69319 Unspecified symptoms and signs involving cognitive functions following cerebral infarction: Secondary | ICD-10-CM | POA: Diagnosis not present

## 2019-06-13 DIAGNOSIS — M4186 Other forms of scoliosis, lumbar region: Secondary | ICD-10-CM | POA: Diagnosis not present

## 2019-07-23 ENCOUNTER — Telehealth: Payer: Self-pay | Admitting: Hematology and Oncology

## 2019-07-23 NOTE — Telephone Encounter (Signed)
Called and left message for patient to call back per 7/29 sch message.

## 2019-07-24 ENCOUNTER — Other Ambulatory Visit: Payer: Medicare Other

## 2019-07-24 ENCOUNTER — Ambulatory Visit: Payer: Medicare Other | Admitting: Hematology and Oncology

## 2019-07-29 ENCOUNTER — Telehealth: Payer: Self-pay

## 2019-07-29 NOTE — Telephone Encounter (Signed)
Returned call to pt and [place] regarding below msg.  VM obtained on both numbers.  LVM for them to call back.

## 2019-07-29 NOTE — Telephone Encounter (Signed)
-----   Message from [name], MD sent at 07/29/2019 10:39 AM EDT ----- [place],  Can you call her caregiver [name] and ask how we can help ----- Message ----- From: Nicholaus Corolla Sent: 07/29/2019  10:29 AM EDT To: [name], MD  Pt called to r/s appt from 7/30 - can Rn Please call her about medication she had some questions.

## 2019-09-05 ENCOUNTER — Inpatient Hospital Stay: Payer: Medicare Other | Attending: Hematology and Oncology

## 2019-09-05 ENCOUNTER — Inpatient Hospital Stay (HOSPITAL_BASED_OUTPATIENT_CLINIC_OR_DEPARTMENT_OTHER): Payer: Medicare Other | Admitting: Hematology and Oncology

## 2019-09-05 ENCOUNTER — Other Ambulatory Visit: Payer: Self-pay

## 2019-09-05 ENCOUNTER — Encounter: Payer: Self-pay | Admitting: Hematology and Oncology

## 2019-09-05 VITALS — BP 97/40 | HR 75 | Temp 98.5°F | Resp 18 | Ht 59.0 in | Wt 127.2 lb

## 2019-09-05 DIAGNOSIS — D7589 Other specified diseases of blood and blood-forming organs: Secondary | ICD-10-CM | POA: Insufficient documentation

## 2019-09-05 DIAGNOSIS — R21 Rash and other nonspecific skin eruption: Secondary | ICD-10-CM | POA: Insufficient documentation

## 2019-09-05 DIAGNOSIS — M8580 Other specified disorders of bone density and structure, unspecified site: Secondary | ICD-10-CM | POA: Insufficient documentation

## 2019-09-05 DIAGNOSIS — D61818 Other pancytopenia: Secondary | ICD-10-CM

## 2019-09-05 DIAGNOSIS — R251 Tremor, unspecified: Secondary | ICD-10-CM | POA: Diagnosis not present

## 2019-09-05 DIAGNOSIS — Z23 Encounter for immunization: Secondary | ICD-10-CM | POA: Diagnosis not present

## 2019-09-05 DIAGNOSIS — F70 Mild intellectual disabilities: Secondary | ICD-10-CM | POA: Diagnosis not present

## 2019-09-05 DIAGNOSIS — Z0000 Encounter for general adult medical examination without abnormal findings: Secondary | ICD-10-CM

## 2019-09-05 DIAGNOSIS — K760 Fatty (change of) liver, not elsewhere classified: Secondary | ICD-10-CM | POA: Diagnosis not present

## 2019-09-05 DIAGNOSIS — Z79899 Other long term (current) drug therapy: Secondary | ICD-10-CM | POA: Insufficient documentation

## 2019-09-05 LAB — CBC WITH DIFFERENTIAL/PLATELET
Abs Immature Granulocytes: 0.01 10*3/uL (ref 0.00–0.07)
Basophils Absolute: 0 10*3/uL (ref 0.0–0.1)
Basophils Relative: 0 %
Eosinophils Absolute: 0 10*3/uL (ref 0.0–0.5)
Eosinophils Relative: 0 %
HCT: 36.9 % (ref 36.0–46.0)
Hemoglobin: 11.7 g/dL — ABNORMAL LOW (ref 12.0–15.0)
Immature Granulocytes: 0 %
Lymphocytes Relative: 28 %
Lymphs Abs: 1.1 10*3/uL (ref 0.7–4.0)
MCH: 31.9 pg (ref 26.0–34.0)
MCHC: 31.7 g/dL (ref 30.0–36.0)
MCV: 100.5 fL — ABNORMAL HIGH (ref 80.0–100.0)
Monocytes Absolute: 0.2 10*3/uL (ref 0.1–1.0)
Monocytes Relative: 5 %
Neutro Abs: 2.8 10*3/uL (ref 1.7–7.7)
Neutrophils Relative %: 67 %
Platelets: 81 10*3/uL — ABNORMAL LOW (ref 150–400)
RBC: 3.67 MIL/uL — ABNORMAL LOW (ref 3.87–5.11)
RDW: 12.6 % (ref 11.5–15.5)
WBC: 4.1 10*3/uL (ref 4.0–10.5)
nRBC: 0 % (ref 0.0–0.2)

## 2019-09-05 MED ORDER — INFLUENZA VAC SPLIT QUAD 0.5 ML IM SUSY
0.5000 mL | PREFILLED_SYRINGE | Freq: Once | INTRAMUSCULAR | Status: AC
  Administered 2019-09-05: 0.5 mL via INTRAMUSCULAR
  Filled 2019-09-05: qty 0.5

## 2019-09-05 NOTE — Assessment & Plan Note (Signed)
Her caregiver has noted some tremors Certainly, medication such as prednisone can cause tremors Her last TSH was within normal limits I recommend consideration for neurology consultation or primary care doctor follow-up

## 2019-09-05 NOTE — Assessment & Plan Note (Signed)
She has mild macrocytosis, likely due to fatty liver disease Her last serum vitamin B12 was on the lower end of normal I recommend her to take multivitamin/B12 supplement

## 2019-09-05 NOTE — Assessment & Plan Note (Signed)
The rash is most consistent with an insect bite I recommend conservative approach only

## 2019-09-05 NOTE — Assessment & Plan Note (Signed)
She continues to have mild intermittent pancytopenia, secondary to autoimmune condition For now, I do not recommend changing the dose of her prednisone She will continue 5 mg alternate with 2.5 mg every other day The goal is only to keep platelet count over 50,000 to prevent bleeding complications I will consider prednisone taper when I see her back in 6 months

## 2019-09-05 NOTE — Progress Notes (Signed)
[place] OFFICE PROGRESS NOTE  [name], MD  ASSESSMENT & PLAN:  Pancytopenia, acquired [facility]) She continues to have mild intermittent pancytopenia, secondary to autoimmune condition For now, I do not recommend changing the dose of her prednisone She will continue 5 mg alternate with 2.5 mg every other day The goal is only to keep platelet count over 50,000 to prevent bleeding complications I will consider prednisone taper when I see her back in 6 months  Macrocytosis She has mild macrocytosis, likely due to fatty liver disease Her last serum vitamin B12 was on the lower end of normal I recommend her to take multivitamin/B12 supplement  Tremor of both hands Her caregiver has noted some tremors Certainly, medication such as prednisone can cause tremors Her last TSH was within normal limits I recommend consideration for neurology consultation or primary care doctor follow-up  Rash The rash is most consistent with an insect bite I recommend conservative approach only   No orders of the defined types were placed in this encounter. We discussed the importance of preventive care and reviewed the vaccination programs. She does not have any prior allergic reactions to influenza vaccination. She agrees to proceed with influenza vaccination today and we will administer it today at the clinic.   INTERVAL HISTORY: [name] 58 y.o. female returns for further follow-up for chronic pancytopenia I spoke with [name], her caregiver over the telephone She had a spot on her left lower leg recently The patient does not feel it causes much itchiness Her caregiver has noted some tremors recently The patient denies any recent signs or symptoms of bleeding such as spontaneous epistaxis, hematuria or hematochezia. No recent infection, fever or chills  SUMMARY OF HEMATOLOGIC HISTORY:  [place] is here because of thrombocytopenia. She has mild mental  retardation.  Her background history is not clear. She lives with [name] for 4 years.  She was found to have abnormal CBC from recent blood work. She has normal CBC as of last year. Most recently, repeat CBC show mild leukopenia with a platelet count of 89,000.  That was subsequently repeated and it got even lower to 69,000. She had discovered recent bruising but denies spontaneous epistaxis, hematuria, melena or hematochezia The patient denies history of liver disease, exposure to heparin, history of cardiac murmur/prior cardiovascular surgery or recent new medications She had received blood transfusion in the past. Although the patient denies a history of liver disease, ultrasound of the abdomen from April 2012 revealed fatty infiltration of the liver, suspicious for possible fatty liver disease versus other liver condition. She denies recent new medications.  She denies recent infection. Further workup revealed evidence of liver lesion on CT imaging and positive ANA screen. She was being observed from the hematology standpoint to progression of pancytopenia On 09/09/2018, she was started on 40 mg of prednisone daily On October 1st, 2019, the dose of prednisone is reduced to 20 mg daily On October 01, 2018, dose of prednisone is reduced to 10 mg daily On October 08, 2018, the dose of prednisone is reduced to 5 mg Bone density scan in November 2019 showed osteopenia with T score of -2.4 On March 06, 2019, the dose of prednisone is reduced to 2.5 mg daily On March 24, 2019, the dose of prednisone is adjusted to 2.5 mg alternate with 5 mg every other day  I have reviewed the past medical history, past surgical history, social history and family history with the patient and  they are unchanged from previous note.  ALLERGIES:  has no allergies on file.  MEDICATIONS:  Current Outpatient Medications  Medication Sig Dispense Refill  . desvenlafaxine (PRISTIQ) 50 MG 24 hr tablet Take 1 tablet (50  mg total) by mouth daily. 30 tablet 0  . methylphenidate (RITALIN) 10 MG tablet Take 1 tablet (10 mg total) by mouth every morning. 30 tablet 0  . predniSONE (DELTASONE) 5 MG tablet Take 5 mg alternate with 2.5 mg every other day 90 tablet 1  . triamcinolone cream (KENALOG) 0.1 % Apply thin layer to affected area TID PRN itching 30 g 0   No current facility-administered medications for this visit.      REVIEW OF SYSTEMS:   Constitutional: Denies fevers, chills or night sweats Eyes: Denies blurriness of vision Ears, nose, mouth, throat, and face: Denies mucositis or sore throat Respiratory: Denies cough, dyspnea or wheezes Cardiovascular: Denies palpitation, chest discomfort or lower extremity swelling Gastrointestinal:  Denies nausea, heartburn or change in bowel habits Lymphatics: Denies new lymphadenopathy or easy bruising Behavioral/Psych: Mood is stable, no new changes  All other systems were reviewed with the patient and are negative.  PHYSICAL EXAMINATION: ECOG PERFORMANCE STATUS: 1 - Symptomatic but completely ambulatory  Vitals:   09/05/19 0942  BP: (!) 97/40  Pulse: 75  Resp: 18  Temp: 98.5 F (36.9 C)  SpO2: 100%   Filed Weights   09/05/19 0942  Weight: 127 lb 3.2 oz (57.7 kg)    GENERAL:alert, no distress and comfortable SKIN: The rash on the left lower leg gives most compatible with an insect bite EYES: normal, Conjunctiva are pink and non-injected, sclera clear OROPHARYNX:no exudate, no erythema and lips, buccal mucosa, and tongue normal  NECK: supple, thyroid normal size, non-tender, without nodularity LYMPH:  no palpable lymphadenopathy in the cervical, axillary or inguinal LUNGS: clear to auscultation and percussion with normal breathing effort HEART: regular rate & rhythm and no murmurs and no lower extremity edema ABDOMEN:abdomen soft, non-tender and normal bowel sounds Musculoskeletal:no cyanosis of digits and no clubbing  NEURO: alert & oriented x 3  with fluent speech, no focal motor/sensory deficits  LABORATORY DATA:  I have reviewed the data as listed     Component Value Date/Time   NA 144 10/15/2018 0823   NA 143 01/03/2018 1108   K 4.4 10/15/2018 0823   CL 105 10/15/2018 0823   CO2 27 10/15/2018 0823   GLUCOSE 91 10/15/2018 0823   BUN 15 10/15/2018 0823   BUN 19 01/03/2018 1108   CREATININE 1.04 (H) 10/15/2018 0823   CREATININE 1.01 (H) 09/09/2018 0913   CREATININE 0.85 08/22/2014 1031   CALCIUM 9.7 10/15/2018 0823   PROT 6.3 (L) 10/15/2018 0823   PROT 6.2 01/03/2018 1108   ALBUMIN 3.4 (L) 10/15/2018 0823   ALBUMIN 3.9 01/03/2018 1108   AST 15 10/15/2018 0823   AST 15 04/18/2018 1110   ALT 6 10/15/2018 0823   ALT <6 04/18/2018 1110   ALKPHOS 64 10/15/2018 0823   BILITOT 0.3 10/15/2018 0823   BILITOT 0.3 04/18/2018 1110   GFRNONAA 58 (L) 10/15/2018 0823   GFRNONAA 60 (L) 09/09/2018 0913   GFRAA >60 10/15/2018 0823   GFRAA >60 09/09/2018 0913    No results found for: SPEP, UPEP  Lab Results  Component Value Date   WBC 4.1 09/05/2019   NEUTROABS 2.8 09/05/2019   HGB 11.7 (L) 09/05/2019   HCT 36.9 09/05/2019   MCV 100.5 (H) 09/05/2019   PLT 81 (  L) 09/05/2019      Chemistry      Component Value Date/Time   NA 144 10/15/2018 0823   NA 143 01/03/2018 1108   K 4.4 10/15/2018 0823   CL 105 10/15/2018 0823   CO2 27 10/15/2018 0823   BUN 15 10/15/2018 0823   BUN 19 01/03/2018 1108   CREATININE 1.04 (H) 10/15/2018 0823   CREATININE 1.01 (H) 09/09/2018 0913   CREATININE 0.85 08/22/2014 1031      Component Value Date/Time   CALCIUM 9.7 10/15/2018 0823   ALKPHOS 64 10/15/2018 0823   AST 15 10/15/2018 0823   AST 15 04/18/2018 1110   ALT 6 10/15/2018 0823   ALT <6 04/18/2018 1110   BILITOT 0.3 10/15/2018 0823   BILITOT 0.3 04/18/2018 1110       I spent 15 minutes counseling the patient face to face. The total time spent in the appointment was 20 minutes and more than 50% was on counseling.   All  questions were answered. The patient knows to call the clinic with any problems, questions or concerns. No barriers to learning was detected.    [name], MD 9/11/202010:14 AM

## 2019-09-08 ENCOUNTER — Telehealth: Payer: Self-pay | Admitting: Hematology and Oncology

## 2019-09-08 NOTE — Telephone Encounter (Signed)
I left a message with caregiver regarding schedule

## 2019-09-16 ENCOUNTER — Telehealth: Payer: Self-pay

## 2019-09-16 NOTE — Telephone Encounter (Signed)
Heather [name], caregiver called and left a message. Requesting last office visit notes mailed to out. She keeps a notebook with all of [name]'s office visit.

## 2019-09-17 NOTE — Telephone Encounter (Signed)
Progress noted mailed out.

## 2019-09-17 NOTE — Telephone Encounter (Signed)
pls print my progress notes and mail it to her

## 2019-11-10 DIAGNOSIS — E782 Mixed hyperlipidemia: Secondary | ICD-10-CM | POA: Diagnosis not present

## 2019-11-10 DIAGNOSIS — I69319 Unspecified symptoms and signs involving cognitive functions following cerebral infarction: Secondary | ICD-10-CM | POA: Diagnosis not present

## 2019-11-10 DIAGNOSIS — M79605 Pain in left leg: Secondary | ICD-10-CM | POA: Diagnosis not present

## 2019-11-10 DIAGNOSIS — M4186 Other forms of scoliosis, lumbar region: Secondary | ICD-10-CM | POA: Diagnosis not present

## 2019-11-10 DIAGNOSIS — Z0001 Encounter for general adult medical examination with abnormal findings: Secondary | ICD-10-CM | POA: Diagnosis not present

## 2019-12-09 DIAGNOSIS — I69319 Unspecified symptoms and signs involving cognitive functions following cerebral infarction: Secondary | ICD-10-CM | POA: Diagnosis not present

## 2019-12-09 DIAGNOSIS — M79605 Pain in left leg: Secondary | ICD-10-CM | POA: Diagnosis not present

## 2019-12-09 DIAGNOSIS — R251 Tremor, unspecified: Secondary | ICD-10-CM | POA: Diagnosis not present

## 2019-12-09 DIAGNOSIS — E782 Mixed hyperlipidemia: Secondary | ICD-10-CM | POA: Diagnosis not present

## 2019-12-09 DIAGNOSIS — M4186 Other forms of scoliosis, lumbar region: Secondary | ICD-10-CM | POA: Diagnosis not present

## 2020-01-01 DIAGNOSIS — R251 Tremor, unspecified: Secondary | ICD-10-CM | POA: Diagnosis not present

## 2020-01-22 DIAGNOSIS — Z0110 Encounter for examination of ears and hearing without abnormal findings: Secondary | ICD-10-CM | POA: Diagnosis not present

## 2020-01-22 DIAGNOSIS — Z131 Encounter for screening for diabetes mellitus: Secondary | ICD-10-CM | POA: Diagnosis not present

## 2020-01-22 DIAGNOSIS — I69319 Unspecified symptoms and signs involving cognitive functions following cerebral infarction: Secondary | ICD-10-CM | POA: Diagnosis not present

## 2020-01-22 DIAGNOSIS — R251 Tremor, unspecified: Secondary | ICD-10-CM | POA: Diagnosis not present

## 2020-01-22 DIAGNOSIS — Z0000 Encounter for general adult medical examination without abnormal findings: Secondary | ICD-10-CM | POA: Diagnosis not present

## 2020-01-22 DIAGNOSIS — Z136 Encounter for screening for cardiovascular disorders: Secondary | ICD-10-CM | POA: Diagnosis not present

## 2020-01-23 DIAGNOSIS — N3000 Acute cystitis without hematuria: Secondary | ICD-10-CM | POA: Diagnosis not present

## 2020-02-05 DIAGNOSIS — H538 Other visual disturbances: Secondary | ICD-10-CM | POA: Diagnosis not present

## 2020-02-05 DIAGNOSIS — R251 Tremor, unspecified: Secondary | ICD-10-CM | POA: Diagnosis not present

## 2020-02-05 DIAGNOSIS — R519 Headache, unspecified: Secondary | ICD-10-CM | POA: Diagnosis not present

## 2020-02-26 DIAGNOSIS — R251 Tremor, unspecified: Secondary | ICD-10-CM | POA: Diagnosis not present

## 2020-03-03 ENCOUNTER — Other Ambulatory Visit: Payer: Self-pay

## 2020-03-03 DIAGNOSIS — D61818 Other pancytopenia: Secondary | ICD-10-CM

## 2020-03-04 ENCOUNTER — Telehealth: Payer: Self-pay | Admitting: Hematology and Oncology

## 2020-03-04 ENCOUNTER — Encounter: Payer: Self-pay | Admitting: Hematology and Oncology

## 2020-03-04 ENCOUNTER — Inpatient Hospital Stay: Payer: Medicare Other | Attending: Hematology and Oncology

## 2020-03-04 ENCOUNTER — Other Ambulatory Visit: Payer: Self-pay

## 2020-03-04 ENCOUNTER — Inpatient Hospital Stay (HOSPITAL_BASED_OUTPATIENT_CLINIC_OR_DEPARTMENT_OTHER): Payer: Medicare Other | Admitting: Hematology and Oncology

## 2020-03-04 DIAGNOSIS — D7589 Other specified diseases of blood and blood-forming organs: Secondary | ICD-10-CM

## 2020-03-04 DIAGNOSIS — Z791 Long term (current) use of non-steroidal anti-inflammatories (NSAID): Secondary | ICD-10-CM | POA: Insufficient documentation

## 2020-03-04 DIAGNOSIS — Z299 Encounter for prophylactic measures, unspecified: Secondary | ICD-10-CM

## 2020-03-04 DIAGNOSIS — M8580 Other specified disorders of bone density and structure, unspecified site: Secondary | ICD-10-CM | POA: Insufficient documentation

## 2020-03-04 DIAGNOSIS — F79 Unspecified intellectual disabilities: Secondary | ICD-10-CM

## 2020-03-04 DIAGNOSIS — D61818 Other pancytopenia: Secondary | ICD-10-CM | POA: Diagnosis not present

## 2020-03-04 DIAGNOSIS — Z79899 Other long term (current) drug therapy: Secondary | ICD-10-CM | POA: Diagnosis not present

## 2020-03-04 DIAGNOSIS — D693 Immune thrombocytopenic purpura: Secondary | ICD-10-CM

## 2020-03-04 DIAGNOSIS — F70 Mild intellectual disabilities: Secondary | ICD-10-CM | POA: Insufficient documentation

## 2020-03-04 DIAGNOSIS — Z7952 Long term (current) use of systemic steroids: Secondary | ICD-10-CM | POA: Diagnosis not present

## 2020-03-04 LAB — CBC WITH DIFFERENTIAL (CANCER CENTER ONLY)
Abs Immature Granulocytes: 0.01 10*3/uL (ref 0.00–0.07)
Basophils Absolute: 0 10*3/uL (ref 0.0–0.1)
Basophils Relative: 1 %
Eosinophils Absolute: 0 10*3/uL (ref 0.0–0.5)
Eosinophils Relative: 0 %
HCT: 36.5 % (ref 36.0–46.0)
Hemoglobin: 11.5 g/dL — ABNORMAL LOW (ref 12.0–15.0)
Immature Granulocytes: 0 %
Lymphocytes Relative: 37 %
Lymphs Abs: 1.5 10*3/uL (ref 0.7–4.0)
MCH: 32.6 pg (ref 26.0–34.0)
MCHC: 31.5 g/dL (ref 30.0–36.0)
MCV: 103.4 fL — ABNORMAL HIGH (ref 80.0–100.0)
Monocytes Absolute: 0.3 10*3/uL (ref 0.1–1.0)
Monocytes Relative: 8 %
Neutro Abs: 2.2 10*3/uL (ref 1.7–7.7)
Neutrophils Relative %: 54 %
Platelet Count: 87 10*3/uL — ABNORMAL LOW (ref 150–400)
RBC: 3.53 MIL/uL — ABNORMAL LOW (ref 3.87–5.11)
RDW: 12.4 % (ref 11.5–15.5)
WBC Count: 4.1 10*3/uL (ref 4.0–10.5)
nRBC: 0 % (ref 0.0–0.2)

## 2020-03-04 NOTE — Patient Instructions (Signed)
1) Please reduce prednisone to 2.5 mg (half a tablet) daily for next 2 weeks and return for blood check 2) Bring a family member along for your next visit

## 2020-03-04 NOTE — Assessment & Plan Note (Signed)
She continues to have mild intermittent pancytopenia, secondary to autoimmune condition I plan to recheck vitamin B12 and iron studies again in the next visit She is not symptomatic

## 2020-03-04 NOTE — Assessment & Plan Note (Signed)
I discussed the care plan with her principal caregiver I recommend she brings her caregiver with her at her next visit

## 2020-03-04 NOTE — Progress Notes (Signed)
[place] OFFICE PROGRESS NOTE  [name], MD  ASSESSMENT & PLAN:  Pancytopenia, acquired [facility]) She continues to have mild intermittent pancytopenia, secondary to autoimmune condition I plan to recheck vitamin B12 and iron studies again in the next visit She is not symptomatic  Macrocytosis I plan to recheck vitamin B12 in her next visit  Chronic ITP (idiopathic thrombocytopenia) ([place]) She has chronic ITP in the setting of autoimmune condition She is not symptomatic I discussed the risk and benefits of prednisone taper with her caregiver and she agreed to proceed She is currently taking 5 mg alternate with 2.5 mg every other day I plan to reduce her prednisone to 2.5 mg daily and recheck her blood count again in 2 weeks for further follow-up  Preventive measure She is interested to get Covid vaccination We do not offer a Covid vaccine here The patient is consider group 4 based on her risk factors and she is not eligible until the end of the month based on current guidelines  Intellectual disability I discussed the care plan with her principal caregiver I recommend she brings her caregiver with her at her next visit   No orders of the defined types were placed in this encounter.   The total time spent in the appointment was 20 minutes encounter with patients including review of chart and various tests results, discussions about plan of care and coordination of care plan   All questions were answered. The patient knows to call the clinic with any problems, questions or concerns. No barriers to learning was detected.    [name], MD 3/11/202110:43 AM  INTERVAL HISTORY: Heather [name] 59 y.o. female returns for further follow-up Her caregiver is not present She is doing well and feeling well I collaborated the history with her caregiver over the telephone The patient denies any recent signs or symptoms of bleeding such as spontaneous epistaxis,  hematuria or hematochezia. No recent infection, fever or chills I updated her medication list as well  SUMMARY OF HEMATOLOGIC HISTORY:  Heather [name] is here because of thrombocytopenia. She has mild mental retardation.  Her background history is not clear. She lives with Heather [name] for 4 years.  She was found to have abnormal CBC from recent blood work. She has normal CBC as of last year. Most recently, repeat CBC show mild leukopenia with a platelet count of 89,000.  That was subsequently repeated and it got even lower to 69,000. She had discovered recent bruising but denies spontaneous epistaxis, hematuria, melena or hematochezia The patient denies history of liver disease, exposure to heparin, history of cardiac murmur/prior cardiovascular surgery or recent new medications She had received blood transfusion in the past. Although the patient denies a history of liver disease, ultrasound of the abdomen from April 2012 revealed fatty infiltration of the liver, suspicious for possible fatty liver disease versus other liver condition. She denies recent new medications.  She denies recent infection. Further workup revealed evidence of liver lesion on CT imaging and positive ANA screen. She was being observed from the hematology standpoint to progression of pancytopenia On 09/09/2018, she was started on 40 mg of prednisone daily On October 1st, 2019, the dose of prednisone is reduced to 20 mg daily On October 01, 2018, dose of prednisone is reduced to 10 mg daily On October 08, 2018, the dose of prednisone is reduced to 5 mg Bone density scan in November 2019 showed osteopenia with T score of -2.4 On March 06, 2019, the dose of prednisone is reduced to 2.5 mg daily On March 24, 2019, the dose of prednisone is adjusted to 2.5 mg alternate with 5 mg every other day  I have reviewed the past medical history, past surgical history, social history and family history with the patient and they are  unchanged from previous note.  ALLERGIES:  has no allergies on file.  MEDICATIONS:  Current Outpatient Medications  Medication Sig Dispense Refill  . gabapentin (NEURONTIN) 300 MG capsule Take 300 mg by mouth at bedtime.    . meloxicam (MOBIC) 7.5 MG tablet Take 15 mg by mouth daily.    Marland Kitchen MYRBETRIQ 25 MG TB24 tablet Take 25 mg by mouth daily.    Marland Kitchen desvenlafaxine (PRISTIQ) 50 MG 24 hr tablet Take 1 tablet (50 mg total) by mouth daily. 30 tablet 0  . methylphenidate (RITALIN) 10 MG tablet Take 1 tablet (10 mg total) by mouth every morning. 30 tablet 0  . predniSONE (DELTASONE) 5 MG tablet Take 5 mg alternate with 2.5 mg every other day 90 tablet 1  . triamcinolone cream (KENALOG) 0.1 % Apply thin layer to affected area TID PRN itching 30 g 0   No current facility-administered medications for this visit.     REVIEW OF SYSTEMS:   Constitutional: Denies fevers, chills or night sweats Eyes: Denies blurriness of vision Ears, nose, mouth, throat, and face: Denies mucositis or sore throat Respiratory: Denies cough, dyspnea or wheezes Cardiovascular: Denies palpitation, chest discomfort or lower extremity swelling Gastrointestinal:  Denies nausea, heartburn or change in bowel habits Skin: Denies abnormal skin rashes Lymphatics: Denies new lymphadenopathy or easy bruising Neurological:Denies numbness, tingling or new weaknesses Behavioral/Psych: Mood is stable, no new changes  All other systems were reviewed with the patient and are negative.  PHYSICAL EXAMINATION: ECOG PERFORMANCE STATUS: 0 - Asymptomatic  Vitals:   03/04/20 0949  BP: 117/67  Pulse: 81  Resp: 18  Temp: 98.2 F (36.8 C)  SpO2: 100%   Filed Weights   03/04/20 0949  Weight: 130 lb 6.4 oz (59.1 kg)    GENERAL:alert, no distress and comfortable SKIN: skin color, texture, turgor are normal, no rashes or significant lesions EYES: normal, Conjunctiva are pink and non-injected, sclera clear OROPHARYNX:no exudate, no  erythema and lips, buccal mucosa, and tongue normal  NECK: supple, thyroid normal size, non-tender, without nodularity LYMPH:  no palpable lymphadenopathy in the cervical, axillary or inguinal LUNGS: clear to auscultation and percussion with normal breathing effort HEART: regular rate & rhythm and no murmurs and no lower extremity edema ABDOMEN:abdomen soft, non-tender and normal bowel sounds Musculoskeletal:no cyanosis of digits and no clubbing  NEURO: alert & oriented x 3 with fluent speech, no focal motor/sensory deficits  LABORATORY DATA:  I have reviewed the data as listed     Component Value Date/Time   NA 144 10/15/2018 0823   NA 143 01/03/2018 1108   K 4.4 10/15/2018 0823   CL 105 10/15/2018 0823   CO2 27 10/15/2018 0823   GLUCOSE 91 10/15/2018 0823   BUN 15 10/15/2018 0823   BUN 19 01/03/2018 1108   CREATININE 1.04 (H) 10/15/2018 0823   CREATININE 1.01 (H) 09/09/2018 0913   CREATININE 0.85 08/22/2014 1031   CALCIUM 9.7 10/15/2018 0823   PROT 6.3 (L) 10/15/2018 0823   PROT 6.2 01/03/2018 1108   ALBUMIN 3.4 (L) 10/15/2018 0823   ALBUMIN 3.9 01/03/2018 1108   AST 15 10/15/2018 0823   AST 15 04/18/2018 1110   ALT  6 10/15/2018 0823   ALT <6 04/18/2018 1110   ALKPHOS 64 10/15/2018 0823   BILITOT 0.3 10/15/2018 0823   BILITOT 0.3 04/18/2018 1110   GFRNONAA 58 (L) 10/15/2018 0823   GFRNONAA 60 (L) 09/09/2018 0913   GFRAA >60 10/15/2018 0823   GFRAA >60 09/09/2018 0913    No results found for: SPEP, UPEP  Lab Results  Component Value Date   WBC 4.1 03/04/2020   NEUTROABS 2.2 03/04/2020   HGB 11.5 (L) 03/04/2020   HCT 36.5 03/04/2020   MCV 103.4 (H) 03/04/2020   PLT 87 (L) 03/04/2020      Chemistry      Component Value Date/Time   NA 144 10/15/2018 0823   NA 143 01/03/2018 1108   K 4.4 10/15/2018 0823   CL 105 10/15/2018 0823   CO2 27 10/15/2018 0823   BUN 15 10/15/2018 0823   BUN 19 01/03/2018 1108   CREATININE 1.04 (H) 10/15/2018 0823   CREATININE  1.01 (H) 09/09/2018 0913   CREATININE 0.85 08/22/2014 1031      Component Value Date/Time   CALCIUM 9.7 10/15/2018 0823   ALKPHOS 64 10/15/2018 0823   AST 15 10/15/2018 0823   AST 15 04/18/2018 1110   ALT 6 10/15/2018 0823   ALT <6 04/18/2018 1110   BILITOT 0.3 10/15/2018 0823   BILITOT 0.3 04/18/2018 1110

## 2020-03-04 NOTE — Assessment & Plan Note (Signed)
She has chronic ITP in the setting of autoimmune condition She is not symptomatic I discussed the risk and benefits of prednisone taper with her caregiver and she agreed to proceed She is currently taking 5 mg alternate with 2.5 mg every other day I plan to reduce her prednisone to 2.5 mg daily and recheck her blood count again in 2 weeks for further follow-up

## 2020-03-04 NOTE — Telephone Encounter (Signed)
Scheduled appt per 3/11 sch message - unable to reach beverly - left message with appt date and time

## 2020-03-04 NOTE — Assessment & Plan Note (Signed)
She is interested to get Covid vaccination We do not offer a Covid vaccine here The patient is consider group 4 based on her risk factors and she is not eligible until the end of the month based on current guidelines

## 2020-03-04 NOTE — Assessment & Plan Note (Addendum)
I plan to recheck vitamin B12 in her next visit

## 2020-03-07 ENCOUNTER — Other Ambulatory Visit: Payer: Self-pay | Admitting: Family Medicine

## 2020-03-09 ENCOUNTER — Encounter (HOSPITAL_BASED_OUTPATIENT_CLINIC_OR_DEPARTMENT_OTHER): Payer: Self-pay

## 2020-03-09 ENCOUNTER — Other Ambulatory Visit: Payer: Self-pay

## 2020-03-09 ENCOUNTER — Emergency Department (HOSPITAL_BASED_OUTPATIENT_CLINIC_OR_DEPARTMENT_OTHER)
Admission: EM | Admit: 2020-03-09 | Discharge: 2020-03-09 | Disposition: A | Discharge status: Home / Self Care | Payer: Medicare Other | Attending: Emergency Medicine | Admitting: Emergency Medicine

## 2020-03-09 ENCOUNTER — Emergency Department (HOSPITAL_BASED_OUTPATIENT_CLINIC_OR_DEPARTMENT_OTHER): Payer: Medicare Other

## 2020-03-09 DIAGNOSIS — M25552 Pain in left hip: Secondary | ICD-10-CM | POA: Diagnosis not present

## 2020-03-09 DIAGNOSIS — Z79899 Other long term (current) drug therapy: Secondary | ICD-10-CM | POA: Diagnosis not present

## 2020-03-09 DIAGNOSIS — M1612 Unilateral primary osteoarthritis, left hip: Secondary | ICD-10-CM | POA: Diagnosis not present

## 2020-03-09 DIAGNOSIS — I69319 Unspecified symptoms and signs involving cognitive functions following cerebral infarction: Secondary | ICD-10-CM | POA: Diagnosis not present

## 2020-03-09 DIAGNOSIS — M4186 Other forms of scoliosis, lumbar region: Secondary | ICD-10-CM | POA: Diagnosis not present

## 2020-03-09 DIAGNOSIS — E782 Mixed hyperlipidemia: Secondary | ICD-10-CM | POA: Diagnosis not present

## 2020-03-09 MED ORDER — TRAMADOL HCL 50 MG PO TABS: 50.0000 mg | ORAL_TABLET | Freq: Four times a day (QID) | ORAL | 0 refills | Status: DC | PRN

## 2020-03-09 NOTE — ED Triage Notes (Addendum)
Per caretaker pt was sent by PCP for c/o chronic pain- with c/o left hip pain increase x 4 days-denies injury-to triage in w/c-NAD

## 2020-03-09 NOTE — ED Provider Notes (Addendum)
[place] EMERGENCY DEPARTMENT Provider Note   CSN: [id number] Arrival date & time: 03/09/20  1722     History Chief Complaint  Patient presents with  . Hip Pain    Heather [name] is a 59 y.o. female.  Patient sent by primary care provider for worsening left hip pain since Sunday.  Patient been followed in the past by orthopedics in the [place] area where she gets frequent injections.  There is been no fall or injury.  Patient also followed by hematology oncology for pancytopenia and ITP.  Seen by them on March 11.        Past Medical History:  Diagnosis Date  . Allergy   . Anxiety    history restored after error with record merge      . Arthritis    history restored after error with record merge      . Depression   . Fatty infiltration of liver    history restored after error with record merge      . Intestinal obstruction (HCC)    as an infant history restored after error with record merge      . Mental retardation    history restored after error with record merge        Patient Active Problem List   Diagnosis Date Noted  . Preventive measure 03/04/2020  . Macrocytosis 09/05/2019  . Tremor of both hands 09/05/2019  . Rash 09/05/2019  . Weight loss 10/08/2018  . Lesion of liver 01/24/2018  . Chronic ITP (idiopathic thrombocytopenia) (H[facility]) 01/24/2018  . Positive ANA (antinuclear antibody) 01/24/2018  . Thrombocytopenia ([place]) 01/10/2018  . Fatty liver disease, nonalcoholic 123456  . Pancytopenia, acquired ([place]) 01/10/2018  . Osteopenia 12/02/2016  . Scoliosis 06/15/2016  . BMI 30.0-30.9,adult 06/15/2016  . RIGHT knee fusion 06/15/2016  . Arthritis of knee 09/17/2014  . Arthritis 08/26/2014  . Anxiety state, unspecified 08/26/2014  . Migraine 08/26/2014  . Intellectual disability 08/26/2014    Past Surgical History:  Procedure Laterality Date  . ABDOMINAL HYSTERECTOMY    . COLONOSCOPY    . fused knee    . KNEE DISLOCATION SURGERY     . POLYPECTOMY    . SIGMOIDOSCOPY       OB History   No obstetric history on file.     Family History  Problem Relation Age of Onset  . Diabetes Mother        history restored after error with record merge      . Colon cancer Neg Hx     Social History   Tobacco Use  . Smoking status: Never Smoker  . Smokeless tobacco: Never Used  Substance Use Topics  . Alcohol use: No    Alcohol/week: 0.0 standard drinks  . Drug use: No    Home Medications Prior to Admission medications   Medication Sig Start Date End Date Taking? Authorizing Provider  desvenlafaxine (PRISTIQ) 50 MG 24 hr tablet Take 1 tablet (50 mg total) by mouth daily. 03/12/18   [name], MD  gabapentin (NEURONTIN) 300 MG capsule Take 300 mg by mouth at bedtime. 02/07/20   [provider]  meloxicam (MOBIC) 7.5 MG tablet Take 15 mg by mouth daily. 02/25/20   [provider]  methylphenidate (RITALIN) 10 MG tablet Take 1 tablet (10 mg total) by mouth every morning. 03/12/18   [name], MD  MYRBETRIQ 25 MG TB24 tablet Take 25 mg by mouth daily. 02/29/20   [provider]  predniSONE (DELTASONE) 5 MG tablet Take 5 mg alternate with 2.5 mg every other day 03/24/19   [name], MD  triamcinolone cream (KENALOG) 0.1 % Apply thin layer to affected area TID PRN itching 05/04/17   Harrison Mons, PA    Allergies    Patient has no known allergies.  Review of Systems   Review of Systems  Constitutional: Negative for chills and fever.  HENT: Negative for congestion, rhinorrhea and sore throat.   Eyes: Negative for visual disturbance.  Respiratory: Negative for cough and shortness of breath.   Cardiovascular: Negative for chest pain and leg swelling.  Gastrointestinal: Negative for abdominal pain, diarrhea, nausea and vomiting.  Genitourinary: Negative for dysuria.  Musculoskeletal: Positive for arthralgias. Negative for back pain and neck pain.  Skin: Negative for rash.    Neurological: Negative for dizziness, light-headedness and headaches.  Hematological: Bruises/bleeds easily.  Psychiatric/Behavioral: Negative for confusion.    Physical Exam Updated Vital Signs BP (!) 143/84 (BP Location: Right Arm)   Pulse 68   Temp 98.1 F (36.7 C) (Oral)   Resp 18   Ht 1.499 m (4\' 11" )   Wt 59.4 kg   SpO2 98%   BMI 26.46 kg/m   Physical Exam Vitals and nursing note reviewed.  Constitutional:      General: She is not in acute distress.    Appearance: Normal appearance. She is well-developed.  HENT:     Head: Normocephalic and atraumatic.  Eyes:     Extraocular Movements: Extraocular movements intact.     Conjunctiva/sclera: Conjunctivae normal.     Pupils: Pupils are equal, round, and reactive to light.  Cardiovascular:     Rate and Rhythm: Normal rate and regular rhythm.     Heart sounds: No murmur.  Pulmonary:     Effort: Pulmonary effort is normal. No respiratory distress.     Breath sounds: Normal breath sounds.  Abdominal:     Palpations: Abdomen is soft.     Tenderness: There is no abdominal tenderness.  Musculoskeletal:        General: No deformity.     Cervical back: Normal range of motion and neck supple.     Comments: Left leg a little bit longer than right leg.  Dorsalis pedis pulse to left leg is 2+.  Good movement of the toes.  Sensation intact.  Pain with movement at the left hip.  No obvious deformity.  Skin:    General: Skin is warm and dry.     Capillary Refill: Capillary refill takes less than 2 seconds.  Neurological:     General: No focal deficit present.     Mental Status: She is alert. Mental status is at baseline.     ED Results / Procedures / Treatments   Labs (all labs ordered are listed, but only abnormal results are displayed) Labs Reviewed - No data to display  EKG None  Radiology DG Hip Unilat W or Wo Pelvis 2-3 Views Left  Result Date: 03/09/2020 CLINICAL DATA:  Pain. EXAM: DG HIP (WITH OR WITHOUT  PELVIS) 2-3V LEFT COMPARISON:  None. FINDINGS: The patient is status post prior right-sided intramedullary nail placement. There are mild degenerative changes of both hips. There is osteopenia. Phleboliths project over the patient's pelvis. There is no acute displaced fracture or dislocation. IMPRESSION: No acute displaced fracture or dislocation. Mild degenerative changes of both hips. Electronically Signed   By: Constance Holster M.D.   On: 03/09/2020 19:02    Procedures Procedures (including  critical care time)  Medications Ordered in ED Medications - No data to display  ED Course  I have reviewed the triage vital signs and the nursing notes.  Pertinent labs & imaging results that were available during my care of the patient were reviewed by me and considered in my medical decision making (see chart for details).    MDM Rules/Calculators/A&P                      We will get x-ray of left hip as well as pelvis.  Patient followed by [facility] orthopedic surgeon.  Family not able to, [place] name.  But she been seen by them several times.  No evidence of any acute injury on the hip x-ray.  Recommend follow-up with her orthopedic surgeon.  And will offer pain control.   Final Clinical Impression(s) / ED Diagnoses Final diagnoses:  Left hip pain    Rx / DC Orders ED Discharge Orders    None       Fredia Sorrow, MD 03/09/20 Pauline Aus    Fredia Sorrow, MD 03/09/20 [phone]

## 2020-03-09 NOTE — ED Notes (Signed)
ED Provider at bedside. 

## 2020-03-09 NOTE — ED Notes (Signed)
PT to xray

## 2020-03-09 NOTE — Discharge Instructions (Addendum)
X-rays of your left hip showed no acute bony injury or dislocation.  Take the tramadol as directed to help with the hip pain.  Short course provided.  Make an appointment to follow-up with the orthopedic doctor.  And continue your appointment with your regular doctor as well as your hematologist oncologist.

## 2020-03-17 ENCOUNTER — Other Ambulatory Visit: Payer: Self-pay

## 2020-03-17 DIAGNOSIS — D61818 Other pancytopenia: Secondary | ICD-10-CM

## 2020-03-18 ENCOUNTER — Other Ambulatory Visit: Payer: Self-pay

## 2020-03-18 ENCOUNTER — Telehealth: Payer: Self-pay | Admitting: Hematology and Oncology

## 2020-03-18 ENCOUNTER — Encounter: Payer: Self-pay | Admitting: Hematology and Oncology

## 2020-03-18 ENCOUNTER — Telehealth: Payer: Self-pay

## 2020-03-18 ENCOUNTER — Other Ambulatory Visit: Payer: Self-pay | Admitting: Hematology and Oncology

## 2020-03-18 ENCOUNTER — Inpatient Hospital Stay: Payer: Medicare Other

## 2020-03-18 ENCOUNTER — Inpatient Hospital Stay (HOSPITAL_BASED_OUTPATIENT_CLINIC_OR_DEPARTMENT_OTHER): Payer: Medicare Other | Admitting: Hematology and Oncology

## 2020-03-18 DIAGNOSIS — Z79899 Other long term (current) drug therapy: Secondary | ICD-10-CM | POA: Diagnosis not present

## 2020-03-18 DIAGNOSIS — D61818 Other pancytopenia: Secondary | ICD-10-CM

## 2020-03-18 DIAGNOSIS — M8580 Other specified disorders of bone density and structure, unspecified site: Secondary | ICD-10-CM | POA: Diagnosis not present

## 2020-03-18 DIAGNOSIS — Z7952 Long term (current) use of systemic steroids: Secondary | ICD-10-CM | POA: Diagnosis not present

## 2020-03-18 DIAGNOSIS — Z791 Long term (current) use of non-steroidal anti-inflammatories (NSAID): Secondary | ICD-10-CM | POA: Diagnosis not present

## 2020-03-18 DIAGNOSIS — D693 Immune thrombocytopenic purpura: Secondary | ICD-10-CM | POA: Diagnosis not present

## 2020-03-18 DIAGNOSIS — E538 Deficiency of other specified B group vitamins: Secondary | ICD-10-CM | POA: Insufficient documentation

## 2020-03-18 LAB — CBC WITH DIFFERENTIAL (CANCER CENTER ONLY)
Abs Immature Granulocytes: 0.02 10*3/uL (ref 0.00–0.07)
Basophils Absolute: 0 10*3/uL (ref 0.0–0.1)
Basophils Relative: 0 %
Eosinophils Absolute: 0 10*3/uL (ref 0.0–0.5)
Eosinophils Relative: 0 %
HCT: 37.6 % (ref 36.0–46.0)
Hemoglobin: 11.8 g/dL — ABNORMAL LOW (ref 12.0–15.0)
Immature Granulocytes: 0 %
Lymphocytes Relative: 29 %
Lymphs Abs: 1.5 10*3/uL (ref 0.7–4.0)
MCH: 32.2 pg (ref 26.0–34.0)
MCHC: 31.4 g/dL (ref 30.0–36.0)
MCV: 102.7 fL — ABNORMAL HIGH (ref 80.0–100.0)
Monocytes Absolute: 0.4 10*3/uL (ref 0.1–1.0)
Monocytes Relative: 7 %
Neutro Abs: 3.2 10*3/uL (ref 1.7–7.7)
Neutrophils Relative %: 64 %
Platelet Count: 105 10*3/uL — ABNORMAL LOW (ref 150–400)
RBC: 3.66 MIL/uL — ABNORMAL LOW (ref 3.87–5.11)
RDW: 12.3 % (ref 11.5–15.5)
WBC Count: 5 10*3/uL (ref 4.0–10.5)
nRBC: 0 % (ref 0.0–0.2)

## 2020-03-18 LAB — VITAMIN B12: Vitamin B-12: 146 pg/mL — ABNORMAL LOW (ref 180–914)

## 2020-03-18 LAB — IRON AND TIBC
Iron: 78 ug/dL (ref 41–142)
Saturation Ratios: 26 % (ref 21–57)
TIBC: 304 ug/dL (ref 236–444)
UIBC: 226 ug/dL (ref 120–384)

## 2020-03-18 LAB — FERRITIN: Ferritin: 71 ng/mL (ref 11–307)

## 2020-03-18 MED ORDER — PREDNISONE 2.5 MG PO TABS: ORAL_TABLET | ORAL | 0 refills | Status: DC

## 2020-03-18 NOTE — Telephone Encounter (Signed)
OK, I plan to discuss Vitamin B12 injection with Heather [name] in her next visit No need to call her back

## 2020-03-18 NOTE — Telephone Encounter (Signed)
-----   Message from [name], MD sent at 03/18/2020 12:20 PM EDT ----- Regarding: B12 Pls call Heather [name], caregiver Her B12  level is low Typically I would suggest B12 injection once a month If she agrees, we can start one next week and then again when I see her If the pateint is afraid of needles, she can try high dose B12 OTC 1000 mcg daily

## 2020-03-18 NOTE — Progress Notes (Signed)
[place] OFFICE PROGRESS NOTE  [name], [place]  ASSESSMENT & PLAN:  Chronic ITP (idiopathic thrombocytopenia) (HCC) She has chronic ITP in the setting of autoimmune condition She is not symptomatic I discussed the risk and benefits of prednisone taper with her caregiver and she agreed to proceed I plan to reduce her prednisone to 2.5 mg to every other day and then by the second week of April, she will only take it twice a week I will see her back at the end of April for further follow-up  Pancytopenia, acquired [facility]) She has mild anemia but not symptomatic I have checked vitamin B12 and iron studies today, results pending and I will call the caregiver if results are abnormal   No orders of the defined types were placed in this encounter.   The total time spent in the appointment was 15 minutes encounter with patients including review of chart and various tests results, discussions about plan of care and coordination of care plan   All questions were answered. The patient knows to call the clinic with any problems, questions or concerns. No barriers to learning was detected.    [name], MD 3/25/202110:18 AM  INTERVAL HISTORY: Heather [name] 59 y.o. female returns for further follow-up with her caregiver She recently have mild joint pain She had x-ray which show mild osteoarthritis The patient denies any recent signs or symptoms of bleeding such as spontaneous epistaxis, hematuria or hematochezia.  SUMMARY OF HEMATOLOGIC HISTORY:  Heather [name] is here because of thrombocytopenia. She has mild mental retardation.  Her background history is not clear. She lives with Heather [name] for 4 years.  She was found to have abnormal CBC from recent blood work. She has normal CBC as of last year. Most recently, repeat CBC show mild leukopenia with a platelet count of 89,000.  That was subsequently repeated and it got even lower to 69,000. She had discovered  recent bruising but denies spontaneous epistaxis, hematuria, melena or hematochezia The patient denies history of liver disease, exposure to heparin, history of cardiac murmur/prior cardiovascular surgery or recent new medications She had received blood transfusion in the past. Although the patient denies a history of liver disease, ultrasound of the abdomen from April 2012 revealed fatty infiltration of the liver, suspicious for possible fatty liver disease versus other liver condition. She denies recent new medications.  She denies recent infection. Further workup revealed evidence of liver lesion on CT imaging and positive ANA screen. She was being observed from the hematology standpoint to progression of pancytopenia On 09/09/2018, she was started on 40 mg of prednisone daily On October 1st, 2019, the dose of prednisone is reduced to 20 mg daily On October 01, 2018, dose of prednisone is reduced to 10 mg daily On October 08, 2018, the dose of prednisone is reduced to 5 mg Bone density scan in November 2019 showed osteopenia with T score of -2.4 On March 06, 2019, the dose of prednisone is reduced to 2.5 mg daily On March 24, 2019, the dose of prednisone is adjusted to 2.5 mg alternate with 5 mg every other day Starting in February, dose of prednisone is reduced to 2.5 mg daily Starting 03/18/2020, prednisone dose is reduced to every other day at 2.5 mg  I have reviewed the past medical history, past surgical history, social history and family history with the patient and they are unchanged from previous note.  ALLERGIES:  has No Known Allergies.  MEDICATIONS:  Current  Outpatient Medications  Medication Sig Dispense Refill  . desvenlafaxine (PRISTIQ) 50 MG 24 hr tablet Take 1 tablet (50 mg total) by mouth daily. 30 tablet 0  . gabapentin (NEURONTIN) 300 MG capsule Take 300 mg by mouth at bedtime.    . meloxicam (MOBIC) 7.5 MG tablet Take 15 mg by mouth daily.    . methylphenidate (RITALIN)  10 MG tablet Take 1 tablet (10 mg total) by mouth every morning. 30 tablet 0  . MYRBETRIQ 25 MG TB24 tablet Take 25 mg by mouth daily.    . predniSONE (DELTASONE) 2.5 MG tablet Take 2.5 mg every other day and as directed 30 tablet 0  . traMADol (ULTRAM) 50 MG tablet Take 1 tablet (50 mg total) by mouth every 6 (six) hours as needed. 15 tablet 0  . triamcinolone cream (KENALOG) 0.1 % Apply thin layer to affected area TID PRN itching 30 g 0   No current facility-administered medications for this visit.     REVIEW OF SYSTEMS:   Constitutional: Denies fevers, chills or night sweats Eyes: Denies blurriness of vision Ears, nose, mouth, throat, and face: Denies mucositis or sore throat Respiratory: Denies cough, dyspnea or wheezes Cardiovascular: Denies palpitation, chest discomfort or lower extremity swelling Gastrointestinal:  Denies nausea, heartburn or change in bowel habits Skin: Denies abnormal skin rashes Lymphatics: Denies new lymphadenopathy or easy bruising Neurological:Denies numbness, tingling or new weaknesses Behavioral/Psych: Mood is stable, no new changes  All other systems were reviewed with the patient and are negative.  PHYSICAL EXAMINATION: ECOG PERFORMANCE STATUS: 0 - Asymptomatic  Vitals:   03/18/20 1003  BP: 115/65  Pulse: 77  Resp: 18  Temp: 98.2 F (36.8 C)  SpO2: 100%   Filed Weights   03/18/20 1003  Weight: 131 lb 3.2 oz (59.5 kg)    GENERAL:alert, no distress and comfortable NEURO: alert & oriented x 3 with fluent speech, no focal motor/sensory deficits  LABORATORY DATA:  I have reviewed the data as listed     Component Value Date/Time   NA 144 10/15/2018 0823   NA 143 01/03/2018 1108   K 4.4 10/15/2018 0823   CL 105 10/15/2018 0823   CO2 27 10/15/2018 0823   GLUCOSE 91 10/15/2018 0823   BUN 15 10/15/2018 0823   BUN 19 01/03/2018 1108   CREATININE 1.04 (H) 10/15/2018 0823   CREATININE 1.01 (H) 09/09/2018 0913   CREATININE 0.85 08/22/2014  1031   CALCIUM 9.7 10/15/2018 0823   PROT 6.3 (L) 10/15/2018 0823   PROT 6.2 01/03/2018 1108   ALBUMIN 3.4 (L) 10/15/2018 0823   ALBUMIN 3.9 01/03/2018 1108   AST 15 10/15/2018 0823   AST 15 04/18/2018 1110   ALT 6 10/15/2018 0823   ALT <6 04/18/2018 1110   ALKPHOS 64 10/15/2018 0823   BILITOT 0.3 10/15/2018 0823   BILITOT 0.3 04/18/2018 1110   GFRNONAA 58 (L) 10/15/2018 0823   GFRNONAA 60 (L) 09/09/2018 0913   GFRAA >60 10/15/2018 0823   GFRAA >60 09/09/2018 0913    No results found for: SPEP, UPEP  Lab Results  Component Value Date   WBC 5.0 03/18/2020   NEUTROABS 3.2 03/18/2020   HGB 11.8 (L) 03/18/2020   HCT 37.6 03/18/2020   MCV 102.7 (H) 03/18/2020   PLT 105 (L) 03/18/2020      Chemistry      Component Value Date/Time   NA 144 10/15/2018 0823   NA 143 01/03/2018 1108   K 4.4 10/15/2018 [id number]  CL 105 10/15/2018 0823   CO2 27 10/15/2018 0823   BUN 15 10/15/2018 0823   BUN 19 01/03/2018 1108   CREATININE 1.04 (H) 10/15/2018 0823   CREATININE 1.01 (H) 09/09/2018 0913   CREATININE 0.85 08/22/2014 1031      Component Value Date/Time   CALCIUM 9.7 10/15/2018 0823   ALKPHOS 64 10/15/2018 0823   AST 15 10/15/2018 0823   AST 15 04/18/2018 1110   ALT 6 10/15/2018 0823   ALT <6 04/18/2018 1110   BILITOT 0.3 10/15/2018 0823   BILITOT 0.3 04/18/2018 1110

## 2020-03-18 NOTE — Telephone Encounter (Signed)
Called and given below message. She verbalized understanding. [name] is not afraid of needles. [name] said they will take B12 1000 mcg daily at home.

## 2020-03-18 NOTE — Assessment & Plan Note (Signed)
She has mild anemia but not symptomatic I have checked vitamin B12 and iron studies today, results pending and I will call the caregiver if results are abnormal

## 2020-03-18 NOTE — Telephone Encounter (Signed)
Scheduled per 3/25 sch msg. Called and spoke with pt, confirmed 4/22 appt

## 2020-03-18 NOTE — Assessment & Plan Note (Signed)
She has chronic ITP in the setting of autoimmune condition She is not symptomatic I discussed the risk and benefits of prednisone taper with her caregiver and she agreed to proceed I plan to reduce her prednisone to 2.5 mg to every other day and then by the second week of April, she will only take it twice a week I will see her back at the end of April for further follow-up

## 2020-03-22 ENCOUNTER — Ambulatory Visit: Payer: Medicare Other | Attending: Internal Medicine

## 2020-03-22 DIAGNOSIS — Z23 Encounter for immunization: Secondary | ICD-10-CM

## 2020-03-22 NOTE — Progress Notes (Signed)
   Covid-19 Vaccination Clinic  Name:  Heather [name]    MRN: [id number] DOB: 10/31/1961  03/22/2020  Heather [name] was observed post Covid-19 immunization for 15 minutes without incident. She was provided with Vaccine Information Sheet and instruction to access the V-Safe system.   Heather [name] was instructed to call 911 with any severe reactions post vaccine: Marland Kitchen Difficulty breathing  . Swelling of face and throat  . A fast heartbeat  . A bad rash all over body  . Dizziness and weakness   Immunizations Administered    Name Date Dose VIS Date Route   Pfizer COVID-19 Vaccine 03/22/2020  3:56 PM 0.3 mL 12/05/2019 Intramuscular   Manufacturer: [place]   Lot: [id number]   [place]: [id number]

## 2020-04-08 DIAGNOSIS — M79662 Pain in left lower leg: Secondary | ICD-10-CM | POA: Diagnosis not present

## 2020-04-08 DIAGNOSIS — M545 Low back pain: Secondary | ICD-10-CM | POA: Diagnosis not present

## 2020-04-08 DIAGNOSIS — M7062 Trochanteric bursitis, left hip: Secondary | ICD-10-CM | POA: Diagnosis not present

## 2020-04-15 ENCOUNTER — Inpatient Hospital Stay (HOSPITAL_BASED_OUTPATIENT_CLINIC_OR_DEPARTMENT_OTHER): Payer: Medicare Other | Admitting: Hematology and Oncology

## 2020-04-15 ENCOUNTER — Inpatient Hospital Stay: Payer: Medicare Other | Attending: Hematology and Oncology

## 2020-04-15 ENCOUNTER — Other Ambulatory Visit: Payer: Self-pay

## 2020-04-15 ENCOUNTER — Other Ambulatory Visit: Payer: Self-pay | Admitting: Hematology and Oncology

## 2020-04-15 ENCOUNTER — Ambulatory Visit: Payer: Medicare Other | Admitting: Hematology and Oncology

## 2020-04-15 ENCOUNTER — Encounter: Payer: Self-pay | Admitting: Hematology and Oncology

## 2020-04-15 ENCOUNTER — Telehealth: Payer: Self-pay

## 2020-04-15 ENCOUNTER — Other Ambulatory Visit: Payer: Medicare Other

## 2020-04-15 DIAGNOSIS — M2460 Ankylosis, unspecified joint: Secondary | ICD-10-CM

## 2020-04-15 DIAGNOSIS — M8580 Other specified disorders of bone density and structure, unspecified site: Secondary | ICD-10-CM | POA: Insufficient documentation

## 2020-04-15 DIAGNOSIS — D693 Immune thrombocytopenic purpura: Secondary | ICD-10-CM | POA: Diagnosis not present

## 2020-04-15 DIAGNOSIS — M6281 Muscle weakness (generalized): Secondary | ICD-10-CM | POA: Diagnosis not present

## 2020-04-15 DIAGNOSIS — K760 Fatty (change of) liver, not elsewhere classified: Secondary | ICD-10-CM | POA: Diagnosis not present

## 2020-04-15 DIAGNOSIS — D61818 Other pancytopenia: Secondary | ICD-10-CM

## 2020-04-15 DIAGNOSIS — Z7952 Long term (current) use of systemic steroids: Secondary | ICD-10-CM | POA: Insufficient documentation

## 2020-04-15 DIAGNOSIS — F70 Mild intellectual disabilities: Secondary | ICD-10-CM | POA: Insufficient documentation

## 2020-04-15 DIAGNOSIS — M7060 Trochanteric bursitis, unspecified hip: Secondary | ICD-10-CM | POA: Diagnosis not present

## 2020-04-15 DIAGNOSIS — E538 Deficiency of other specified B group vitamins: Secondary | ICD-10-CM | POA: Diagnosis not present

## 2020-04-15 DIAGNOSIS — R262 Difficulty in walking, not elsewhere classified: Secondary | ICD-10-CM | POA: Diagnosis not present

## 2020-04-15 DIAGNOSIS — Z79899 Other long term (current) drug therapy: Secondary | ICD-10-CM | POA: Diagnosis not present

## 2020-04-15 LAB — CBC WITH DIFFERENTIAL/PLATELET
Abs Immature Granulocytes: 0.01 10*3/uL (ref 0.00–0.07)
Basophils Absolute: 0 10*3/uL (ref 0.0–0.1)
Basophils Relative: 0 %
Eosinophils Absolute: 0 10*3/uL (ref 0.0–0.5)
Eosinophils Relative: 0 %
HCT: 37.9 % (ref 36.0–46.0)
Hemoglobin: 12.1 g/dL (ref 12.0–15.0)
Immature Granulocytes: 0 %
Lymphocytes Relative: 36 %
Lymphs Abs: 1.4 10*3/uL (ref 0.7–4.0)
MCH: 31.6 pg (ref 26.0–34.0)
MCHC: 31.9 g/dL (ref 30.0–36.0)
MCV: 99 fL (ref 80.0–100.0)
Monocytes Absolute: 0.3 10*3/uL (ref 0.1–1.0)
Monocytes Relative: 8 %
Neutro Abs: 2.2 10*3/uL (ref 1.7–7.7)
Neutrophils Relative %: 56 %
Platelets: 65 10*3/uL — ABNORMAL LOW (ref 150–400)
RBC: 3.83 MIL/uL — ABNORMAL LOW (ref 3.87–5.11)
RDW: 12.2 % (ref 11.5–15.5)
WBC: 4 10*3/uL (ref 4.0–10.5)
nRBC: 0 % (ref 0.0–0.2)

## 2020-04-15 NOTE — Assessment & Plan Note (Signed)
She was found to have vitamin B12 deficiency but declined vitamin B12 injections She would like to try oral vitamin B12 first I plan to recheck her vitamin B12 level in her next visit and if her blood levels and not improving on oral supplement, I will prescribe b12 injection

## 2020-04-15 NOTE — Assessment & Plan Note (Signed)
She has chronic ITP in the setting of autoimmune condition She is not symptomatic She is currently taking 2.5 mg of prednisone twice a week Since recent prednisone taper, she did notice mild increased joint pain on the left side Her platelet count dropped quite a bit since last time I saw her but remains asymptomatic and well above 50,000 I plan to see her again in a month for further follow-up I will also recheck serum vitamin B12 at that time to make sure that this is not contributing to her thrombocytopenia

## 2020-04-15 NOTE — Progress Notes (Signed)
[place] OFFICE PROGRESS NOTE  [name], [place]  ASSESSMENT & PLAN:  Chronic ITP (idiopathic thrombocytopenia) (HCC) She has chronic ITP in the setting of autoimmune condition She is not symptomatic She is currently taking 2.5 mg of prednisone twice a week Since recent prednisone taper, she did notice mild increased joint pain on the left side Her platelet count dropped quite a bit since last time I saw her but remains asymptomatic and well above 50,000 I plan to see her again in a month for further follow-up I will also recheck serum vitamin B12 at that time to make sure that this is not contributing to her thrombocytopenia  Vitamin B12 deficiency She was found to have vitamin B12 deficiency but declined vitamin B12 injections She would like to try oral vitamin B12 first I plan to recheck her vitamin B12 level in her next visit and if her blood levels and not improving on oral supplement, I will prescribe b12 injection  RIGHT knee fusion Suspect her recent right leg cramp is due to muscle spasm from her right knee fusion surgery She is now having left hip problems which is likely due to wear and tear on the contralateral side She would be at risk of accelerated osteoporosis due to chronic prednisone therapy I recommend high-dose vitamin D oral supplementation She has been referred to see physical therapist for rehab on the contralateral leg I will check her vitamin D level in her next visit to make sure she has adequate replacement therapy   Orders Placed This Encounter  Procedures  . Vitamin B12    Standing Status:   Future    Standing Expiration Date:   05/20/2021  . VITAMIN D 25 Hydroxy (Vit-D Deficiency, Fractures)    Standing Status:   Future    Standing Expiration Date:   04/15/2021    The total time spent in the appointment was 20 minutes encounter with patients including review of chart and various tests results, discussions about plan of care and  coordination of care plan   All questions were answered. The patient knows to call the clinic with any problems, questions or concerns. No barriers to learning was detected.    [name], MD 4/22/202111:11 AM  INTERVAL HISTORY: Heather [name] 59 y.o. female returns for further follow-up for chronic ITP and recent diagnosis of vitamin B12 deficiency Her caregiver is here with her today She is currently in the process of being referred to see physical therapist for rehab on the left hip She saw her orthopedic surgeon recently who recommended hip injection but deferred due to her plan for Covid injection She had recent terrible muscle spasm on the right leg Her caregiver is wondering whether her treatment could be causing some of the issues She tolerated vitamin B12 supplementation well The patient denies any recent signs or symptoms of bleeding such as spontaneous epistaxis, hematuria or hematochezia.  SUMMARY OF HEMATOLOGIC HISTORY:  Heather [name] is here because of thrombocytopenia. She has mild mental retardation.  Her background history is not clear. She lives with Heather [name] for 4 years.  She was found to have abnormal CBC from recent blood work. She has normal CBC as of last year. Most recently, repeat CBC show mild leukopenia with a platelet count of 89,000.  That was subsequently repeated and it got even lower to 69,000. She had discovered recent bruising but denies spontaneous epistaxis, hematuria, melena or hematochezia The patient denies history of liver disease, exposure to  heparin, history of cardiac murmur/prior cardiovascular surgery or recent new medications She had received blood transfusion in the past. Although the patient denies a history of liver disease, ultrasound of the abdomen from April 2012 revealed fatty infiltration of the liver, suspicious for possible fatty liver disease versus other liver condition. She denies recent new medications.  She denies recent  infection. Further workup revealed evidence of liver lesion on CT imaging and positive ANA screen. She was being observed from the hematology standpoint to progression of pancytopenia On 09/09/2018, she was started on 40 mg of prednisone daily On October 1st, 2019, the dose of prednisone is reduced to 20 mg daily On October 01, 2018, dose of prednisone is reduced to 10 mg daily On October 08, 2018, the dose of prednisone is reduced to 5 mg Bone density scan in November 2019 showed osteopenia with T score of -2.4 On March 06, 2019, the dose of prednisone is reduced to 2.5 mg daily On March 24, 2019, the dose of prednisone is adjusted to 2.5 mg alternate with 5 mg every other day Starting in February, dose of prednisone is reduced to 2.5 mg daily Starting 03/18/2020, prednisone dose is reduced to every other day at 2.5 mg and then twice a week.  She was found to have vitamin B12 deficiency and started taking oral vitamin B12 supplementation  I have reviewed the past medical history, past surgical history, social history and family history with the patient and they are unchanged from previous note.  ALLERGIES:  has No Known Allergies.  MEDICATIONS:  Current Outpatient Medications  Medication Sig Dispense Refill  . cholecalciferol (VITAMIN D3) 25 MCG (1000 UNIT) tablet Take 2,000 Units by mouth daily.    . vitamin B-12 (CYANOCOBALAMIN) 1000 MCG tablet Take 1,000 mcg by mouth daily.    Marland Kitchen desvenlafaxine (PRISTIQ) 50 MG 24 hr tablet Take 1 tablet (50 mg total) by mouth daily. 30 tablet 0  . gabapentin (NEURONTIN) 300 MG capsule Take 300 mg by mouth at bedtime.    . meloxicam (MOBIC) 7.5 MG tablet Take 15 mg by mouth daily.    . methylphenidate (RITALIN) 10 MG tablet Take 1 tablet (10 mg total) by mouth every morning. 30 tablet 0  . MYRBETRIQ 25 MG TB24 tablet Take 25 mg by mouth daily.    . predniSONE (DELTASONE) 2.5 MG tablet Take 2.5 mg every other day and as directed 30 tablet 0  . traMADol  (ULTRAM) 50 MG tablet Take 1 tablet (50 mg total) by mouth every 6 (six) hours as needed. 15 tablet 0  . triamcinolone cream (KENALOG) 0.1 % Apply thin layer to affected area TID PRN itching 30 g 0   No current facility-administered medications for this visit.     REVIEW OF SYSTEMS:   Constitutional: Denies fevers, chills or night sweats Eyes: Denies blurriness of vision Ears, nose, mouth, throat, and face: Denies mucositis or sore throat Respiratory: Denies cough, dyspnea or wheezes Cardiovascular: Denies palpitation, chest discomfort or lower extremity swelling Gastrointestinal:  Denies nausea, heartburn or change in bowel habits Skin: Denies abnormal skin rashes Lymphatics: Denies new lymphadenopathy or easy bruising Neurological:Denies numbness, tingling or new weaknesses Behavioral/Psych: Mood is stable, no new changes  All other systems were reviewed with the patient and are negative.  PHYSICAL EXAMINATION: ECOG PERFORMANCE STATUS: 1 - Symptomatic but completely ambulatory  Vitals:   04/15/20 1005  BP: (!) 110/51  Pulse: 71  Resp: 18  Temp: 98.2 F (36.8 C)  SpO2: 100%  Filed Weights   04/15/20 1005  Weight: 128 lb 6.4 oz (58.2 kg)    GENERAL:alert, no distress and comfortable  LABORATORY DATA:  I have reviewed the data as listed     Component Value Date/Time   NA 144 10/15/2018 0823   NA 143 01/03/2018 1108   K 4.4 10/15/2018 0823   CL 105 10/15/2018 0823   CO2 27 10/15/2018 0823   GLUCOSE 91 10/15/2018 0823   BUN 15 10/15/2018 0823   BUN 19 01/03/2018 1108   CREATININE 1.04 (H) 10/15/2018 0823   CREATININE 1.01 (H) 09/09/2018 0913   CREATININE 0.85 08/22/2014 1031   CALCIUM 9.7 10/15/2018 0823   PROT 6.3 (L) 10/15/2018 0823   PROT 6.2 01/03/2018 1108   ALBUMIN 3.4 (L) 10/15/2018 0823   ALBUMIN 3.9 01/03/2018 1108   AST 15 10/15/2018 0823   AST 15 04/18/2018 1110   ALT 6 10/15/2018 0823   ALT <6 04/18/2018 1110   ALKPHOS 64 10/15/2018 0823    BILITOT 0.3 10/15/2018 0823   BILITOT 0.3 04/18/2018 1110   GFRNONAA 58 (L) 10/15/2018 0823   GFRNONAA 60 (L) 09/09/2018 0913   GFRAA >60 10/15/2018 0823   GFRAA >60 09/09/2018 0913    No results found for: SPEP, UPEP  Lab Results  Component Value Date   WBC 4.0 04/15/2020   NEUTROABS 2.2 04/15/2020   HGB 12.1 04/15/2020   HCT 37.9 04/15/2020   MCV 99.0 04/15/2020   PLT 65 (L) 04/15/2020      Chemistry      Component Value Date/Time   NA 144 10/15/2018 0823   NA 143 01/03/2018 1108   K 4.4 10/15/2018 0823   CL 105 10/15/2018 0823   CO2 27 10/15/2018 0823   BUN 15 10/15/2018 0823   BUN 19 01/03/2018 1108   CREATININE 1.04 (H) 10/15/2018 0823   CREATININE 1.01 (H) 09/09/2018 0913   CREATININE 0.85 08/22/2014 1031      Component Value Date/Time   CALCIUM 9.7 10/15/2018 0823   ALKPHOS 64 10/15/2018 0823   AST 15 10/15/2018 0823   AST 15 04/18/2018 1110   ALT 6 10/15/2018 0823   ALT <6 04/18/2018 1110   BILITOT 0.3 10/15/2018 0823   BILITOT 0.3 04/18/2018 1110

## 2020-04-15 NOTE — Telephone Encounter (Signed)
Mailed out today's office note to [place].

## 2020-04-15 NOTE — Assessment & Plan Note (Signed)
Suspect her recent right leg cramp is due to muscle spasm from her right knee fusion surgery She is now having left hip problems which is likely due to wear and tear on the contralateral side She would be at risk of accelerated osteoporosis due to chronic prednisone therapy I recommend high-dose vitamin D oral supplementation She has been referred to see physical therapist for rehab on the contralateral leg I will check her vitamin D level in her next visit to make sure she has adequate replacement therapy

## 2020-04-15 NOTE — Telephone Encounter (Signed)
-----   Message from [name], MD sent at 04/15/2020 11:14 AM EDT ----- Regarding: pls mail today's progress notes to [facility]

## 2020-04-16 ENCOUNTER — Other Ambulatory Visit: Payer: Self-pay | Admitting: Family Medicine

## 2020-04-16 ENCOUNTER — Telehealth: Payer: Self-pay | Admitting: Hematology and Oncology

## 2020-04-16 NOTE — Telephone Encounter (Signed)
Scheduled appt per 4/22 sch message - unable to reach pt . Left message with appt date and time

## 2020-04-19 ENCOUNTER — Ambulatory Visit: Payer: Medicare Other | Attending: Internal Medicine

## 2020-04-19 DIAGNOSIS — Z23 Encounter for immunization: Secondary | ICD-10-CM

## 2020-04-19 NOTE — Progress Notes (Signed)
   Covid-19 Vaccination Clinic  Name:  Heather [name]    MRN: [id number] DOB: 13-Feb-1961  04/19/2020  [name] was observed post Covid-19 immunization for 15 minutes without incident. She was provided with Vaccine Information Sheet and instruction to access the V-Safe system.   [name] was instructed to call 911 with any severe reactions post vaccine: Marland Kitchen Difficulty breathing  . Swelling of face and throat  . A fast heartbeat  . A bad rash all over body  . Dizziness and weakness   Immunizations Administered    Name Date Dose VIS Date Route   Pfizer COVID-19 Vaccine 04/19/2020  4:55 PM 0.3 mL 02/18/2019 Intramuscular   Manufacturer: [place]   Lot: [id number]   [place]: [id number]

## 2020-04-20 DIAGNOSIS — R262 Difficulty in walking, not elsewhere classified: Secondary | ICD-10-CM | POA: Diagnosis not present

## 2020-04-20 DIAGNOSIS — M7060 Trochanteric bursitis, unspecified hip: Secondary | ICD-10-CM | POA: Diagnosis not present

## 2020-04-20 DIAGNOSIS — M6281 Muscle weakness (generalized): Secondary | ICD-10-CM | POA: Diagnosis not present

## 2020-04-23 DIAGNOSIS — M7060 Trochanteric bursitis, unspecified hip: Secondary | ICD-10-CM | POA: Diagnosis not present

## 2020-04-23 DIAGNOSIS — M6281 Muscle weakness (generalized): Secondary | ICD-10-CM | POA: Diagnosis not present

## 2020-04-23 DIAGNOSIS — R262 Difficulty in walking, not elsewhere classified: Secondary | ICD-10-CM | POA: Diagnosis not present

## 2020-04-27 DIAGNOSIS — R262 Difficulty in walking, not elsewhere classified: Secondary | ICD-10-CM | POA: Diagnosis not present

## 2020-04-27 DIAGNOSIS — M6281 Muscle weakness (generalized): Secondary | ICD-10-CM | POA: Diagnosis not present

## 2020-04-27 DIAGNOSIS — M7060 Trochanteric bursitis, unspecified hip: Secondary | ICD-10-CM | POA: Diagnosis not present

## 2020-04-30 DIAGNOSIS — R262 Difficulty in walking, not elsewhere classified: Secondary | ICD-10-CM | POA: Diagnosis not present

## 2020-04-30 DIAGNOSIS — M6281 Muscle weakness (generalized): Secondary | ICD-10-CM | POA: Diagnosis not present

## 2020-04-30 DIAGNOSIS — M7060 Trochanteric bursitis, unspecified hip: Secondary | ICD-10-CM | POA: Diagnosis not present

## 2020-05-04 DIAGNOSIS — M7060 Trochanteric bursitis, unspecified hip: Secondary | ICD-10-CM | POA: Diagnosis not present

## 2020-05-04 DIAGNOSIS — M6281 Muscle weakness (generalized): Secondary | ICD-10-CM | POA: Diagnosis not present

## 2020-05-04 DIAGNOSIS — R262 Difficulty in walking, not elsewhere classified: Secondary | ICD-10-CM | POA: Diagnosis not present

## 2020-05-06 DIAGNOSIS — M7062 Trochanteric bursitis, left hip: Secondary | ICD-10-CM | POA: Diagnosis not present

## 2020-05-11 DIAGNOSIS — R262 Difficulty in walking, not elsewhere classified: Secondary | ICD-10-CM | POA: Diagnosis not present

## 2020-05-11 DIAGNOSIS — M7060 Trochanteric bursitis, unspecified hip: Secondary | ICD-10-CM | POA: Diagnosis not present

## 2020-05-11 DIAGNOSIS — M6281 Muscle weakness (generalized): Secondary | ICD-10-CM | POA: Diagnosis not present

## 2020-05-14 DIAGNOSIS — M7060 Trochanteric bursitis, unspecified hip: Secondary | ICD-10-CM | POA: Diagnosis not present

## 2020-05-14 DIAGNOSIS — R262 Difficulty in walking, not elsewhere classified: Secondary | ICD-10-CM | POA: Diagnosis not present

## 2020-05-14 DIAGNOSIS — M6281 Muscle weakness (generalized): Secondary | ICD-10-CM | POA: Diagnosis not present

## 2020-05-18 DIAGNOSIS — R262 Difficulty in walking, not elsewhere classified: Secondary | ICD-10-CM | POA: Diagnosis not present

## 2020-05-18 DIAGNOSIS — M6281 Muscle weakness (generalized): Secondary | ICD-10-CM | POA: Diagnosis not present

## 2020-05-18 DIAGNOSIS — M7060 Trochanteric bursitis, unspecified hip: Secondary | ICD-10-CM | POA: Diagnosis not present

## 2020-05-20 ENCOUNTER — Other Ambulatory Visit: Payer: Self-pay

## 2020-05-20 ENCOUNTER — Encounter: Payer: Self-pay | Admitting: Hematology and Oncology

## 2020-05-20 ENCOUNTER — Inpatient Hospital Stay: Payer: Medicare Other | Attending: Hematology and Oncology | Admitting: Hematology and Oncology

## 2020-05-20 ENCOUNTER — Inpatient Hospital Stay: Payer: Medicare Other

## 2020-05-20 ENCOUNTER — Telehealth: Payer: Self-pay

## 2020-05-20 ENCOUNTER — Telehealth: Payer: Self-pay | Admitting: Hematology and Oncology

## 2020-05-20 DIAGNOSIS — F70 Mild intellectual disabilities: Secondary | ICD-10-CM | POA: Diagnosis not present

## 2020-05-20 DIAGNOSIS — E538 Deficiency of other specified B group vitamins: Secondary | ICD-10-CM

## 2020-05-20 DIAGNOSIS — M8580 Other specified disorders of bone density and structure, unspecified site: Secondary | ICD-10-CM | POA: Insufficient documentation

## 2020-05-20 DIAGNOSIS — M2460 Ankylosis, unspecified joint: Secondary | ICD-10-CM

## 2020-05-20 DIAGNOSIS — D61818 Other pancytopenia: Secondary | ICD-10-CM

## 2020-05-20 DIAGNOSIS — D693 Immune thrombocytopenic purpura: Secondary | ICD-10-CM | POA: Insufficient documentation

## 2020-05-20 DIAGNOSIS — Z7952 Long term (current) use of systemic steroids: Secondary | ICD-10-CM | POA: Insufficient documentation

## 2020-05-20 LAB — VITAMIN B12: Vitamin B-12: 1825 pg/mL — ABNORMAL HIGH (ref 180–914)

## 2020-05-20 LAB — CBC WITH DIFFERENTIAL/PLATELET
Abs Immature Granulocytes: 0.01 10*3/uL (ref 0.00–0.07)
Basophils Absolute: 0 10*3/uL (ref 0.0–0.1)
Basophils Relative: 0 %
Eosinophils Absolute: 0 10*3/uL (ref 0.0–0.5)
Eosinophils Relative: 0 %
HCT: 37 % (ref 36.0–46.0)
Hemoglobin: 11.9 g/dL — ABNORMAL LOW (ref 12.0–15.0)
Immature Granulocytes: 0 %
Lymphocytes Relative: 32 %
Lymphs Abs: 1.6 10*3/uL (ref 0.7–4.0)
MCH: 31.8 pg (ref 26.0–34.0)
MCHC: 32.2 g/dL (ref 30.0–36.0)
MCV: 98.9 fL (ref 80.0–100.0)
Monocytes Absolute: 0.3 10*3/uL (ref 0.1–1.0)
Monocytes Relative: 5 %
Neutro Abs: 3.1 10*3/uL (ref 1.7–7.7)
Neutrophils Relative %: 63 %
Platelets: 79 10*3/uL — ABNORMAL LOW (ref 150–400)
RBC: 3.74 MIL/uL — ABNORMAL LOW (ref 3.87–5.11)
RDW: 13.3 % (ref 11.5–15.5)
WBC: 4.9 10*3/uL (ref 4.0–10.5)
nRBC: 0 % (ref 0.0–0.2)

## 2020-05-20 LAB — VITAMIN D 25 HYDROXY (VIT D DEFICIENCY, FRACTURES): Vit D, 25-Hydroxy: 31.84 ng/mL (ref 30–100)

## 2020-05-20 MED ORDER — PREDNISONE 2.5 MG PO TABS: ORAL_TABLET | ORAL | 9 refills | Status: DC

## 2020-05-20 NOTE — Telephone Encounter (Signed)
-----   Message from [name], MD sent at 05/20/2020  2:16 PM EDT ----- Regarding: pls call [name]: B12 and vitamin D levels are good. also print and send her a copy of today's notes

## 2020-05-20 NOTE — Assessment & Plan Note (Signed)
She has good success with oral vitamin B12 replacement therapy Repeat B12 level is sufficient She will continue that indefinitely I plan to recheck vitamin B12 again in a few months

## 2020-05-20 NOTE — Telephone Encounter (Signed)
Scheduled per 5/27 sch message. Unable to reach pt. Left voicemail- appts added on 8/26.

## 2020-05-20 NOTE — Telephone Encounter (Signed)
Called and left below message. Ask her to call the office back for questions. 

## 2020-05-20 NOTE — Assessment & Plan Note (Signed)
She has chronic ITP in the setting of autoimmune condition She is not symptomatic She is currently taking 2.5 mg of prednisone twice a week We discussed the risk and benefits of discontinuation of prednisone versus staying on low-dose prednisone indefinitely After much discussion, were in agreement to keep prednisone at 2.5 mg twice a week I refilled her prescription today

## 2020-05-20 NOTE — Progress Notes (Signed)
[place] OFFICE PROGRESS NOTE  [name], [place]  ASSESSMENT & PLAN:  Chronic ITP (idiopathic thrombocytopenia) (HCC) She has chronic ITP in the setting of autoimmune condition She is not symptomatic She is currently taking 2.5 mg of prednisone twice a week We discussed the risk and benefits of discontinuation of prednisone versus staying on low-dose prednisone indefinitely After much discussion, were in agreement to keep prednisone at 2.5 mg twice a week I refilled her prescription today  Vitamin B12 deficiency She has good success with oral vitamin B12 replacement therapy Repeat B12 level is sufficient She will continue that indefinitely I plan to recheck vitamin B12 again in a few months  Osteopenia She is at risk of accelerated osteoporosis due to chronic prednisone therapy Vitamin D level is adequate She will continue taking vitamin D supplement indefinitely   Orders Placed This Encounter  Procedures  . Vitamin B12    Standing Status:   Standing    Number of Occurrences:   3    Standing Expiration Date:   05/20/2021    The total time spent in the appointment was 20 minutes encounter with patients including review of chart and various tests results, discussions about plan of care and coordination of care plan   All questions were answered. The patient knows to call the clinic with any problems, questions or concerns. No barriers to learning was detected.    [name], MD 5/27/20212:15 PM  INTERVAL HISTORY: Heather [name] 59 y.o. female returns for further follow-up on chronic ITP and vitamin B12 deficiency She is doing well She is compliant taking medications as directed She is undergoing physical therapy twice a week She had received hip injection recently that seems to help The patient denies any recent signs or symptoms of bleeding such as spontaneous epistaxis, hematuria or hematochezia. She is here with her caregiver  SUMMARY OF  HEMATOLOGIC HISTORY:  Heather [name] is here because of thrombocytopenia. She has mild mental retardation.  Her background history is not clear. She lives with Heather [name] for 4 years.  She was found to have abnormal CBC from recent blood work. She has normal CBC as of last year. Most recently, repeat CBC show mild leukopenia with a platelet count of 89,000.  That was subsequently repeated and it got even lower to 69,000. She had discovered recent bruising but denies spontaneous epistaxis, hematuria, melena or hematochezia The patient denies history of liver disease, exposure to heparin, history of cardiac murmur/prior cardiovascular surgery or recent new medications She had received blood transfusion in the past. Although the patient denies a history of liver disease, ultrasound of the abdomen from April 2012 revealed fatty infiltration of the liver, suspicious for possible fatty liver disease versus other liver condition. She denies recent new medications.  She denies recent infection. Further workup revealed evidence of liver lesion on CT imaging and positive ANA screen. She was being observed from the hematology standpoint to progression of pancytopenia On 09/09/2018, she was started on 40 mg of prednisone daily On October 1st, 2019, the dose of prednisone is reduced to 20 mg daily On October 01, 2018, dose of prednisone is reduced to 10 mg daily On October 08, 2018, the dose of prednisone is reduced to 5 mg Bone density scan in November 2019 showed osteopenia with T score of -2.4 On March 06, 2019, the dose of prednisone is reduced to 2.5 mg daily On March 24, 2019, the dose of prednisone is adjusted to 2.5  mg alternate with 5 mg every other day Starting in February, dose of prednisone is reduced to 2.5 mg daily Starting 03/18/2020, prednisone dose is reduced to every other day at 2.5 mg and then twice a week.  She was found to have vitamin B12 deficiency and started taking oral vitamin B12  supplementation  I have reviewed the past medical history, past surgical history, social history and family history with the patient and they are unchanged from previous note.  ALLERGIES:  has No Known Allergies.  MEDICATIONS:  Current Outpatient Medications  Medication Sig Dispense Refill  . cholecalciferol (VITAMIN D3) 25 MCG (1000 UNIT) tablet Take 2,000 Units by mouth daily.    Marland Kitchen desvenlafaxine (PRISTIQ) 50 MG 24 hr tablet Take 1 tablet (50 mg total) by mouth daily. 30 tablet 0  . gabapentin (NEURONTIN) 300 MG capsule Take 300 mg by mouth at bedtime.    . meloxicam (MOBIC) 7.5 MG tablet Take 15 mg by mouth daily.    . methylphenidate (RITALIN) 10 MG tablet Take 1 tablet (10 mg total) by mouth every morning. 30 tablet 0  . MYRBETRIQ 25 MG TB24 tablet Take 25 mg by mouth daily.    . predniSONE (DELTASONE) 2.5 MG tablet Take 2.5 mg twice a week by mouth 30 tablet 9  . traMADol (ULTRAM) 50 MG tablet Take 1 tablet (50 mg total) by mouth every 6 (six) hours as needed. 15 tablet 0  . triamcinolone cream (KENALOG) 0.1 % Apply thin layer to affected area TID PRN itching 30 g 0  . vitamin B-12 (CYANOCOBALAMIN) 1000 MCG tablet Take 1,000 mcg by mouth daily.     No current facility-administered medications for this visit.     REVIEW OF SYSTEMS:   Constitutional: Denies fevers, chills or night sweats Eyes: Denies blurriness of vision Ears, nose, mouth, throat, and face: Denies mucositis or sore throat Respiratory: Denies cough, dyspnea or wheezes Cardiovascular: Denies palpitation, chest discomfort or lower extremity swelling Gastrointestinal:  Denies nausea, heartburn or change in bowel habits Skin: Denies abnormal skin rashes Lymphatics: Denies new lymphadenopathy or easy bruising Neurological:Denies numbness, tingling or new weaknesses Behavioral/Psych: Mood is stable, no new changes  All other systems were reviewed with the patient and are negative.  PHYSICAL EXAMINATION: ECOG  PERFORMANCE STATUS: 1 - Symptomatic but completely ambulatory  Vitals:   05/20/20 0945  BP: 113/69  Pulse: 83  Resp: 18  Temp: 98 F (36.7 C)  SpO2: 99%   Filed Weights   05/20/20 0945  Weight: 125 lb 12.8 oz (57.1 kg)    GENERAL:alert, no distress and comfortable  LABORATORY DATA:  I have reviewed the data as listed     Component Value Date/Time   NA 144 10/15/2018 0823   NA 143 01/03/2018 1108   K 4.4 10/15/2018 0823   CL 105 10/15/2018 0823   CO2 27 10/15/2018 0823   GLUCOSE 91 10/15/2018 0823   BUN 15 10/15/2018 0823   BUN 19 01/03/2018 1108   CREATININE 1.04 (H) 10/15/2018 0823   CREATININE 1.01 (H) 09/09/2018 0913   CREATININE 0.85 08/22/2014 1031   CALCIUM 9.7 10/15/2018 0823   PROT 6.3 (L) 10/15/2018 0823   PROT 6.2 01/03/2018 1108   ALBUMIN 3.4 (L) 10/15/2018 0823   ALBUMIN 3.9 01/03/2018 1108   AST 15 10/15/2018 0823   AST 15 04/18/2018 1110   ALT 6 10/15/2018 0823   ALT <6 04/18/2018 1110   ALKPHOS 64 10/15/2018 0823   BILITOT 0.3 10/15/2018 [id number]  BILITOT 0.3 04/18/2018 1110   GFRNONAA 58 (L) 10/15/2018 0823   GFRNONAA 60 (L) 09/09/2018 0913   GFRAA >60 10/15/2018 0823   GFRAA >60 09/09/2018 0913    No results found for: SPEP, UPEP  Lab Results  Component Value Date   WBC 4.9 05/20/2020   NEUTROABS 3.1 05/20/2020   HGB 11.9 (L) 05/20/2020   HCT 37.0 05/20/2020   MCV 98.9 05/20/2020   PLT 79 (L) 05/20/2020      Chemistry      Component Value Date/Time   NA 144 10/15/2018 0823   NA 143 01/03/2018 1108   K 4.4 10/15/2018 0823   CL 105 10/15/2018 0823   CO2 27 10/15/2018 0823   BUN 15 10/15/2018 0823   BUN 19 01/03/2018 1108   CREATININE 1.04 (H) 10/15/2018 0823   CREATININE 1.01 (H) 09/09/2018 0913   CREATININE 0.85 08/22/2014 1031      Component Value Date/Time   CALCIUM 9.7 10/15/2018 0823   ALKPHOS 64 10/15/2018 0823   AST 15 10/15/2018 0823   AST 15 04/18/2018 1110   ALT 6 10/15/2018 0823   ALT <6 04/18/2018 1110    BILITOT 0.3 10/15/2018 0823   BILITOT 0.3 04/18/2018 1110

## 2020-05-20 NOTE — Assessment & Plan Note (Signed)
She is at risk of accelerated osteoporosis due to chronic prednisone therapy Vitamin D level is adequate She will continue taking vitamin D supplement indefinitely

## 2020-05-21 DIAGNOSIS — R262 Difficulty in walking, not elsewhere classified: Secondary | ICD-10-CM | POA: Diagnosis not present

## 2020-05-21 DIAGNOSIS — M6281 Muscle weakness (generalized): Secondary | ICD-10-CM | POA: Diagnosis not present

## 2020-05-21 DIAGNOSIS — M7060 Trochanteric bursitis, unspecified hip: Secondary | ICD-10-CM | POA: Diagnosis not present

## 2020-05-25 DIAGNOSIS — M7060 Trochanteric bursitis, unspecified hip: Secondary | ICD-10-CM | POA: Diagnosis not present

## 2020-05-25 DIAGNOSIS — R262 Difficulty in walking, not elsewhere classified: Secondary | ICD-10-CM | POA: Diagnosis not present

## 2020-05-25 DIAGNOSIS — M6281 Muscle weakness (generalized): Secondary | ICD-10-CM | POA: Diagnosis not present

## 2020-06-17 DIAGNOSIS — E782 Mixed hyperlipidemia: Secondary | ICD-10-CM | POA: Diagnosis not present

## 2020-06-17 DIAGNOSIS — G629 Polyneuropathy, unspecified: Secondary | ICD-10-CM | POA: Diagnosis not present

## 2020-06-17 DIAGNOSIS — I69319 Unspecified symptoms and signs involving cognitive functions following cerebral infarction: Secondary | ICD-10-CM | POA: Diagnosis not present

## 2020-06-17 DIAGNOSIS — M47816 Spondylosis without myelopathy or radiculopathy, lumbar region: Secondary | ICD-10-CM | POA: Diagnosis not present

## 2020-08-19 ENCOUNTER — Inpatient Hospital Stay: Payer: Medicare Other | Admitting: Hematology and Oncology

## 2020-08-19 ENCOUNTER — Inpatient Hospital Stay: Payer: Medicare Other

## 2020-08-25 DIAGNOSIS — Z20822 Contact with and (suspected) exposure to covid-19: Secondary | ICD-10-CM | POA: Diagnosis not present

## 2020-09-09 ENCOUNTER — Telehealth: Payer: Self-pay | Admitting: Hematology and Oncology

## 2020-09-09 ENCOUNTER — Encounter: Payer: Self-pay | Admitting: Hematology and Oncology

## 2020-09-09 ENCOUNTER — Inpatient Hospital Stay (HOSPITAL_BASED_OUTPATIENT_CLINIC_OR_DEPARTMENT_OTHER): Payer: Medicare Other | Admitting: Hematology and Oncology

## 2020-09-09 ENCOUNTER — Telehealth: Payer: Self-pay

## 2020-09-09 ENCOUNTER — Inpatient Hospital Stay: Payer: Medicare Other | Attending: Hematology and Oncology

## 2020-09-09 ENCOUNTER — Other Ambulatory Visit: Payer: Self-pay

## 2020-09-09 VITALS — BP 103/52 | HR 76 | Temp 97.2°F | Resp 17 | Ht 59.0 in | Wt 126.6 lb

## 2020-09-09 DIAGNOSIS — Z7952 Long term (current) use of systemic steroids: Secondary | ICD-10-CM | POA: Insufficient documentation

## 2020-09-09 DIAGNOSIS — F70 Mild intellectual disabilities: Secondary | ICD-10-CM | POA: Diagnosis not present

## 2020-09-09 DIAGNOSIS — M8580 Other specified disorders of bone density and structure, unspecified site: Secondary | ICD-10-CM | POA: Insufficient documentation

## 2020-09-09 DIAGNOSIS — E538 Deficiency of other specified B group vitamins: Secondary | ICD-10-CM

## 2020-09-09 DIAGNOSIS — Z299 Encounter for prophylactic measures, unspecified: Secondary | ICD-10-CM

## 2020-09-09 DIAGNOSIS — D61818 Other pancytopenia: Secondary | ICD-10-CM

## 2020-09-09 DIAGNOSIS — D693 Immune thrombocytopenic purpura: Secondary | ICD-10-CM | POA: Insufficient documentation

## 2020-09-09 DIAGNOSIS — Z23 Encounter for immunization: Secondary | ICD-10-CM | POA: Diagnosis not present

## 2020-09-09 LAB — CBC WITH DIFFERENTIAL/PLATELET
Abs Immature Granulocytes: 0.02 10*3/uL (ref 0.00–0.07)
Basophils Absolute: 0 10*3/uL (ref 0.0–0.1)
Basophils Relative: 1 %
Eosinophils Absolute: 0 10*3/uL (ref 0.0–0.5)
Eosinophils Relative: 0 %
HCT: 34.7 % — ABNORMAL LOW (ref 36.0–46.0)
Hemoglobin: 11 g/dL — ABNORMAL LOW (ref 12.0–15.0)
Immature Granulocytes: 1 %
Lymphocytes Relative: 26 %
Lymphs Abs: 1.1 10*3/uL (ref 0.7–4.0)
MCH: 32.5 pg (ref 26.0–34.0)
MCHC: 31.7 g/dL (ref 30.0–36.0)
MCV: 102.7 fL — ABNORMAL HIGH (ref 80.0–100.0)
Monocytes Absolute: 0.3 10*3/uL (ref 0.1–1.0)
Monocytes Relative: 7 %
Neutro Abs: 2.7 10*3/uL (ref 1.7–7.7)
Neutrophils Relative %: 65 %
Platelets: 63 10*3/uL — ABNORMAL LOW (ref 150–400)
RBC: 3.38 MIL/uL — ABNORMAL LOW (ref 3.87–5.11)
RDW: 12 % (ref 11.5–15.5)
WBC: 4.2 10*3/uL (ref 4.0–10.5)
nRBC: 0 % (ref 0.0–0.2)

## 2020-09-09 LAB — VITAMIN B12: Vitamin B-12: 1694 pg/mL — ABNORMAL HIGH (ref 180–914)

## 2020-09-09 MED ORDER — INFLUENZA VAC SPLIT QUAD 0.5 ML IM SUSY
PREFILLED_SYRINGE | INTRAMUSCULAR | Status: AC
  Filled 2020-09-09: qty 0.5

## 2020-09-09 MED ORDER — INFLUENZA VAC SPLIT QUAD 0.5 ML IM SUSY
0.5000 mL | PREFILLED_SYRINGE | Freq: Once | INTRAMUSCULAR | Status: AC
  Administered 2020-09-09: 0.5 mL via INTRAMUSCULAR

## 2020-09-09 NOTE — Telephone Encounter (Signed)
Called and left a message per Dr. Alvy Bimler B12 looks good. Mailed out a copy of today's office visit to [place]. Ask her to call the office if needed.

## 2020-09-09 NOTE — Assessment & Plan Note (Signed)
She has chronic ITP in the setting of autoimmune condition She is not symptomatic She is currently taking 2.5 mg of prednisone twice a week We discussed the risk and benefits of discontinuation of prednisone versus staying on low-dose prednisone indefinitely After much discussion, were in agreement to keep prednisone at 2.5 mg twice a week I plan to see her again in 3 months for further follow-up

## 2020-09-09 NOTE — Progress Notes (Signed)
[place] OFFICE PROGRESS NOTE  [name], [place]  ASSESSMENT & PLAN:  Chronic ITP (idiopathic thrombocytopenia) (HCC) She has chronic ITP in the setting of autoimmune condition She is not symptomatic She is currently taking 2.5 mg of prednisone twice a week We discussed the risk and benefits of discontinuation of prednisone versus staying on low-dose prednisone indefinitely After much discussion, were in agreement to keep prednisone at 2.5 mg twice a week I plan to see her again in 3 months for further follow-up  Vitamin B12 deficiency Her vitamin B12 level is adequate She will continue oral supplement  Preventive measure We discussed the importance of preventive care and reviewed the vaccination programs. She does not have any prior allergic reactions to influenza vaccination. She agrees to proceed with influenza vaccination today and we will administer it today at the clinic.    No orders of the defined types were placed in this encounter.   The total time spent in the appointment was 15 minutes encounter with patients including review of chart and various tests results, discussions about plan of care and coordination of care plan   All questions were answered. The patient knows to call the clinic with any problems, questions or concerns. No barriers to learning was detected.    [name], MD 9/16/20211:33 PM  INTERVAL HISTORY: Desiree Hane 59 y.o. female returns for further follow-up She denies recent bleeding Tolerated prednisone well She returns with her caregiver today  SUMMARY OF HEMATOLOGIC HISTORY:  [name] is here because of thrombocytopenia. She has mild mental retardation.  Her background history is not clear. She lives with [name] for 4 years.  She was found to have abnormal CBC from recent blood work. She has normal CBC as of last year. Most recently, repeat CBC show mild leukopenia with a platelet count of 89,000.   That was subsequently repeated and it got even lower to 69,000. She had discovered recent bruising but denies spontaneous epistaxis, hematuria, melena or hematochezia The patient denies history of liver disease, exposure to heparin, history of cardiac murmur/prior cardiovascular surgery or recent new medications She had received blood transfusion in the past. Although the patient denies a history of liver disease, ultrasound of the abdomen from April 2012 revealed fatty infiltration of the liver, suspicious for possible fatty liver disease versus other liver condition. She denies recent new medications.  She denies recent infection. Further workup revealed evidence of liver lesion on CT imaging and positive ANA screen. She was being observed from the hematology standpoint to progression of pancytopenia On 09/09/2018, she was started on 40 mg of prednisone daily On October 1st, 2019, the dose of prednisone is reduced to 20 mg daily On October 01, 2018, dose of prednisone is reduced to 10 mg daily On October 08, 2018, the dose of prednisone is reduced to 5 mg Bone density scan in November 2019 showed osteopenia with T score of -2.4 On March 06, 2019, the dose of prednisone is reduced to 2.5 mg daily On March 24, 2019, the dose of prednisone is adjusted to 2.5 mg alternate with 5 mg every other day Starting in February, dose of prednisone is reduced to 2.5 mg daily Starting 03/18/2020, prednisone dose is reduced to every other day at 2.5 mg and then twice a week.  She was found to have vitamin B12 deficiency and started taking oral vitamin B12 supplementation  I have reviewed the past medical history, past surgical history, social history and  family history with the patient and they are unchanged from previous note.  ALLERGIES:  has No Known Allergies.  MEDICATIONS:  Current Outpatient Medications  Medication Sig Dispense Refill  . cholecalciferol (VITAMIN D3) 25 MCG (1000 UNIT) tablet Take 2,000  Units by mouth daily.    Marland Kitchen desvenlafaxine (PRISTIQ) 50 MG 24 hr tablet Take 1 tablet (50 mg total) by mouth daily. 30 tablet 0  . gabapentin (NEURONTIN) 300 MG capsule Take 300 mg by mouth at bedtime.    . meloxicam (MOBIC) 7.5 MG tablet Take 15 mg by mouth daily.    Marland Kitchen MYRBETRIQ 25 MG TB24 tablet Take 25 mg by mouth daily.    . predniSONE (DELTASONE) 2.5 MG tablet Take 2.5 mg twice a week by mouth 30 tablet 9  . traMADol (ULTRAM) 50 MG tablet Take 1 tablet (50 mg total) by mouth every 6 (six) hours as needed. 15 tablet 0  . triamcinolone cream (KENALOG) 0.1 % Apply thin layer to affected area TID PRN itching 30 g 0  . vitamin B-12 (CYANOCOBALAMIN) 1000 MCG tablet Take 1,000 mcg by mouth daily.     No current facility-administered medications for this visit.     REVIEW OF SYSTEMS:   All other systems were reviewed with the patient and are negative.  PHYSICAL EXAMINATION: ECOG PERFORMANCE STATUS: 1 - Symptomatic but completely ambulatory  Vitals:   09/09/20 1020  BP: (!) 103/52  Pulse: 76  Resp: 17  Temp: (!) 97.2 F (36.2 C)  SpO2: 98%   Filed Weights   09/09/20 1020  Weight: 126 lb 9.6 oz (57.4 kg)    GENERAL:alert, no distress and comfortable NEURO: alert & oriented x 3 with fluent speech, no focal motor/sensory deficits  LABORATORY DATA:  I have reviewed the data as listed     Component Value Date/Time   NA 144 10/15/2018 0823   NA 143 01/03/2018 1108   K 4.4 10/15/2018 0823   CL 105 10/15/2018 0823   CO2 27 10/15/2018 0823   GLUCOSE 91 10/15/2018 0823   BUN 15 10/15/2018 0823   BUN 19 01/03/2018 1108   CREATININE 1.04 (H) 10/15/2018 0823   CREATININE 1.01 (H) 09/09/2018 0913   CREATININE 0.85 08/22/2014 1031   CALCIUM 9.7 10/15/2018 0823   PROT 6.3 (L) 10/15/2018 0823   PROT 6.2 01/03/2018 1108   ALBUMIN 3.4 (L) 10/15/2018 0823   ALBUMIN 3.9 01/03/2018 1108   AST 15 10/15/2018 0823   AST 15 04/18/2018 1110   ALT 6 10/15/2018 0823   ALT <6 04/18/2018 1110    ALKPHOS 64 10/15/2018 0823   BILITOT 0.3 10/15/2018 0823   BILITOT 0.3 04/18/2018 1110   GFRNONAA 58 (L) 10/15/2018 0823   GFRNONAA 60 (L) 09/09/2018 0913   GFRAA >60 10/15/2018 0823   GFRAA >60 09/09/2018 0913    No results found for: SPEP, UPEP  Lab Results  Component Value Date   WBC 4.2 09/09/2020   NEUTROABS 2.7 09/09/2020   HGB 11.0 (L) 09/09/2020   HCT 34.7 (L) 09/09/2020   MCV 102.7 (H) 09/09/2020   PLT 63 (L) 09/09/2020      Chemistry      Component Value Date/Time   NA 144 10/15/2018 0823   NA 143 01/03/2018 1108   K 4.4 10/15/2018 0823   CL 105 10/15/2018 0823   CO2 27 10/15/2018 0823   BUN 15 10/15/2018 0823   BUN 19 01/03/2018 1108   CREATININE 1.04 (H) 10/15/2018 0823   CREATININE  1.01 (H) 09/09/2018 0913   CREATININE 0.85 08/22/2014 1031      Component Value Date/Time   CALCIUM 9.7 10/15/2018 0823   ALKPHOS 64 10/15/2018 0823   AST 15 10/15/2018 0823   AST 15 04/18/2018 1110   ALT 6 10/15/2018 0823   ALT <6 04/18/2018 1110   BILITOT 0.3 10/15/2018 0823   BILITOT 0.3 04/18/2018 1110

## 2020-09-09 NOTE — Assessment & Plan Note (Signed)
We discussed the importance of preventive care and reviewed the vaccination programs. She does not have any prior allergic reactions to influenza vaccination. She agrees to proceed with influenza vaccination today and we will administer it today at the clinic.  

## 2020-09-09 NOTE — Assessment & Plan Note (Signed)
Her vitamin B12 level is adequate She will continue oral supplement

## 2020-09-09 NOTE — Telephone Encounter (Signed)
Scheduled appts per 9/16 los. Gave pt a print out of AVS.

## 2020-09-17 DIAGNOSIS — I69319 Unspecified symptoms and signs involving cognitive functions following cerebral infarction: Secondary | ICD-10-CM | POA: Diagnosis not present

## 2020-09-17 DIAGNOSIS — M4186 Other forms of scoliosis, lumbar region: Secondary | ICD-10-CM | POA: Diagnosis not present

## 2020-09-17 DIAGNOSIS — E782 Mixed hyperlipidemia: Secondary | ICD-10-CM | POA: Diagnosis not present

## 2020-09-17 DIAGNOSIS — M79605 Pain in left leg: Secondary | ICD-10-CM | POA: Diagnosis not present

## 2020-12-02 ENCOUNTER — Inpatient Hospital Stay: Payer: Medicare Other | Admitting: Hematology and Oncology

## 2020-12-02 ENCOUNTER — Inpatient Hospital Stay: Payer: Medicare Other

## 2020-12-02 ENCOUNTER — Telehealth: Payer: Self-pay

## 2020-12-02 NOTE — Telephone Encounter (Signed)
VM message left for [facility] who takes care of above Pt's appointments, about missed appointment today. No return call

## 2021-01-13 ENCOUNTER — Inpatient Hospital Stay: Payer: Medicare Other

## 2021-01-13 ENCOUNTER — Inpatient Hospital Stay: Payer: Medicare Other | Admitting: Hematology and Oncology

## 2021-02-10 ENCOUNTER — Encounter: Payer: Self-pay | Admitting: Hematology and Oncology

## 2021-02-10 ENCOUNTER — Inpatient Hospital Stay: Payer: Medicare Other | Attending: Hematology and Oncology

## 2021-02-10 ENCOUNTER — Other Ambulatory Visit: Payer: Self-pay

## 2021-02-10 ENCOUNTER — Inpatient Hospital Stay (HOSPITAL_BASED_OUTPATIENT_CLINIC_OR_DEPARTMENT_OTHER): Payer: Medicare Other | Admitting: Hematology and Oncology

## 2021-02-10 DIAGNOSIS — Z7952 Long term (current) use of systemic steroids: Secondary | ICD-10-CM | POA: Insufficient documentation

## 2021-02-10 DIAGNOSIS — D693 Immune thrombocytopenic purpura: Secondary | ICD-10-CM | POA: Insufficient documentation

## 2021-02-10 DIAGNOSIS — F79 Unspecified intellectual disabilities: Secondary | ICD-10-CM | POA: Diagnosis not present

## 2021-02-10 DIAGNOSIS — E538 Deficiency of other specified B group vitamins: Secondary | ICD-10-CM

## 2021-02-10 DIAGNOSIS — F70 Mild intellectual disabilities: Secondary | ICD-10-CM | POA: Insufficient documentation

## 2021-02-10 DIAGNOSIS — M8580 Other specified disorders of bone density and structure, unspecified site: Secondary | ICD-10-CM | POA: Diagnosis not present

## 2021-02-10 DIAGNOSIS — D61818 Other pancytopenia: Secondary | ICD-10-CM

## 2021-02-10 LAB — CBC WITH DIFFERENTIAL/PLATELET
Abs Immature Granulocytes: 0.01 10*3/uL (ref 0.00–0.07)
Basophils Absolute: 0 10*3/uL (ref 0.0–0.1)
Basophils Relative: 0 %
Eosinophils Absolute: 0 10*3/uL (ref 0.0–0.5)
Eosinophils Relative: 0 %
HCT: 37.9 % (ref 36.0–46.0)
Hemoglobin: 12 g/dL (ref 12.0–15.0)
Immature Granulocytes: 0 %
Lymphocytes Relative: 30 %
Lymphs Abs: 1.1 10*3/uL (ref 0.7–4.0)
MCH: 31.3 pg (ref 26.0–34.0)
MCHC: 31.7 g/dL (ref 30.0–36.0)
MCV: 98.7 fL (ref 80.0–100.0)
Monocytes Absolute: 0.2 10*3/uL (ref 0.1–1.0)
Monocytes Relative: 6 %
Neutro Abs: 2.4 10*3/uL (ref 1.7–7.7)
Neutrophils Relative %: 64 %
Platelets: 61 10*3/uL — ABNORMAL LOW (ref 150–400)
RBC: 3.84 MIL/uL — ABNORMAL LOW (ref 3.87–5.11)
RDW: 13.8 % (ref 11.5–15.5)
WBC: 3.8 10*3/uL — ABNORMAL LOW (ref 4.0–10.5)
nRBC: 0 % (ref 0.0–0.2)

## 2021-02-10 LAB — VITAMIN B12: Vitamin B-12: 726 pg/mL (ref 180–914)

## 2021-02-10 NOTE — Assessment & Plan Note (Signed)
She has chronic ITP in the setting of autoimmune condition She is not symptomatic She is currently taking 2.5 mg of prednisone twice a week The trend of her platelet count is going down I am concerned that we might run into risk of bleeding in the future I reviewed treatment options with her caregiver She responded well to prednisone in the past and we could decide possibility of increasing the dose of her prednisone but that is not without risk Chronic prednisone dependency can carry excessive risk of delay wound healing, infection, weight gain, diabetes, osteoporosis and others  I also recommend consideration for further work-up We could consider bone marrow aspirate and biopsy for further evaluation I reviewed the procedure bone marrow aspirate and biopsy with the caregiver whether it is done with sedation or unsedated Final bone marrow result will be back in 2 weeks  The benefit of pursuing bone marrow aspirate and biopsy would be to consider treatment with either Nplate or Promacta, both on medicines within the category of growth factors, to help stimulate platelet growth One of the rare side effects of these injection could be associated with myelofibrosis and hence the rationale behind doing a bone marrow biopsy before treatment  Her caregiver, [name], is not able to make decision for her She is not the dedicated healthcare power of attorney When it comes to invasive procedures, the patient's brother with have to give permission for that Unfortunately, her brother is not able to attend her clinic visits After a long discussion, she would like to present options to her brother sometime next week and will call me with final decision If her decision is not to pursue bone marrow biopsy, I plan to see her back in 2 months In the meantime, I do not advise her to make any changes to her medication

## 2021-02-10 NOTE — Assessment & Plan Note (Signed)
The patient is not able to make informed decision about whether to proceed with bone marrow aspirate and biopsy or not

## 2021-02-10 NOTE — Progress Notes (Signed)
[facility] OFFICE PROGRESS NOTE  Heather [name], [place]  ASSESSMENT & PLAN:  Chronic ITP (idiopathic thrombocytopenia) (HCC) She has chronic ITP in the setting of autoimmune condition She is not symptomatic She is currently taking 2.5 mg of prednisone twice a week The trend of her platelet count is going down I am concerned that we might run into risk of bleeding in the future I reviewed treatment options with her caregiver She responded well to prednisone in the past and we could decide possibility of increasing the dose of her prednisone but that is not without risk Chronic prednisone dependency can carry excessive risk of delay wound healing, infection, weight gain, diabetes, osteoporosis and others  I also recommend consideration for further work-up We could consider bone marrow aspirate and biopsy for further evaluation I reviewed the procedure bone marrow aspirate and biopsy with the caregiver whether it is done with sedation or unsedated Final bone marrow result will be back in 2 weeks  The benefit of pursuing bone marrow aspirate and biopsy would be to consider treatment with either Nplate or Promacta, both on medicines within the category of growth factors, to help stimulate platelet growth One of the rare side effects of these injection could be associated with myelofibrosis and hence the rationale behind doing a bone marrow biopsy before treatment  Her caregiver, Heather [name], is not able to make decision for her She is not the dedicated healthcare power of attorney When it comes to invasive procedures, the patient's brother with have to give permission for that Unfortunately, her brother is not able to attend her clinic visits After a long discussion, she would like to present options to her brother sometime next week and will call me with final decision If her decision is not to pursue bone marrow biopsy, I plan to see her back in 2 months In the meantime, I do  not advise her to make any changes to her medication  Vitamin B12 deficiency Her repeat vitamin B12 level is adequate She will continue oral supplement  Intellectual disability The patient is not able to make informed decision about whether to proceed with bone marrow aspirate and biopsy or not   No orders of the defined types were placed in this encounter.   The total time spent in the appointment was 30 minutes encounter with patients including review of chart and various tests results, discussions about plan of care and coordination of care plan   All questions were answered. The patient knows to call the clinic with any problems, questions or concerns. No barriers to learning was detected.    Heather Lark, MD 2/17/202211:49 AM  INTERVAL HISTORY: Heather [name] 60 y.o. female returns for further follow-up on autoimmune ITP with occasional leukopenia She is feeling well Denies recent bruising or bleeding No recent infection She is here accompanied by family, her principal caregiver  SUMMARY OF HEMATOLOGIC HISTORY:  Heather [name] is here because of thrombocytopenia. She has mild mental deficit.  Her background history is not clear. She lives with Heather [name] for 4 years.  She was found to have abnormal CBC from recent blood work. She has normal CBC as of last year. Most recently, repeat CBC show mild leukopenia with a platelet count of 89,000.  That was subsequently repeated and it got even lower to 69,000. She had discovered recent bruising but denies spontaneous epistaxis, hematuria, melena or hematochezia The patient denies history of liver disease, exposure to heparin, history of cardiac murmur/prior  cardiovascular surgery or recent new medications She had received blood transfusion in the past. Although the patient denies a history of liver disease, ultrasound of the abdomen from April 2012 revealed fatty infiltration of the liver, suspicious for possible fatty liver  disease versus other liver condition. She denies recent new medications.  She denies recent infection. Further workup revealed evidence of liver lesion on CT imaging and positive ANA screen. She was being observed from the hematology standpoint to progression of pancytopenia On 09/09/2018, she was started on 40 mg of prednisone daily On October 1st, 2019, the dose of prednisone is reduced to 20 mg daily On October 01, 2018, dose of prednisone is reduced to 10 mg daily On October 08, 2018, the dose of prednisone is reduced to 5 mg Bone density scan in November 2019 showed osteopenia with T score of -2.4 On March 06, 2019, the dose of prednisone is reduced to 2.5 mg daily On March 24, 2019, the dose of prednisone is adjusted to 2.5 mg alternate with 5 mg every other day Starting in February, dose of prednisone is reduced to 2.5 mg daily Starting 03/18/2020, prednisone dose is reduced to every other day at 2.5 mg and then twice a week.  She was found to have vitamin B12 deficiency and started taking oral vitamin B12 supplementation   I have reviewed the past medical history, past surgical history, social history and family history with the patient and they are unchanged from previous note.  ALLERGIES:  has No Known Allergies.  MEDICATIONS:  Current Outpatient Medications  Medication Sig Dispense Refill  . cholecalciferol (VITAMIN D3) 25 MCG (1000 UNIT) tablet Take 2,000 Units by mouth daily.    Marland Kitchen desvenlafaxine (PRISTIQ) 50 MG 24 hr tablet Take 1 tablet (50 mg total) by mouth daily. 30 tablet 0  . gabapentin (NEURONTIN) 300 MG capsule Take 300 mg by mouth at bedtime.    . meloxicam (MOBIC) 7.5 MG tablet Take 15 mg by mouth daily.    Marland Kitchen MYRBETRIQ 25 MG TB24 tablet Take 25 mg by mouth daily.    . predniSONE (DELTASONE) 2.5 MG tablet Take 2.5 mg twice a week by mouth 30 tablet 9  . traMADol (ULTRAM) 50 MG tablet Take 1 tablet (50 mg total) by mouth every 6 (six) hours as needed. 15 tablet 0  .  triamcinolone cream (KENALOG) 0.1 % Apply thin layer to affected area TID PRN itching 30 g 0  . vitamin B-12 (CYANOCOBALAMIN) 1000 MCG tablet Take 1,000 mcg by mouth daily.     No current facility-administered medications for this visit.     REVIEW OF SYSTEMS:   Constitutional: Denies fevers, chills or night sweats Eyes: Denies blurriness of vision Ears, nose, mouth, throat, and face: Denies mucositis or sore throat Respiratory: Denies cough, dyspnea or wheezes Cardiovascular: Denies palpitation, chest discomfort or lower extremity swelling Gastrointestinal:  Denies nausea, heartburn or change in bowel habits Skin: Denies abnormal skin rashes Lymphatics: Denies new lymphadenopathy or easy bruising Neurological:Denies numbness, tingling or new weaknesses Behavioral/Psych: Mood is stable, no new changes  All other systems were reviewed with the patient and are negative.  PHYSICAL EXAMINATION: ECOG PERFORMANCE STATUS: 0 - Asymptomatic  Vitals:   02/10/21 1032  BP: (!) 113/57  Pulse: 80  Resp: 18  Temp: (!) 97.4 F (36.3 C)  SpO2: 98%   Filed Weights   02/10/21 1032  Weight: 124 lb 9.6 oz (56.5 kg)    GENERAL:alert, no distress and comfortable Musculoskeletal:no cyanosis of  digits and no clubbing  NEURO: alert & oriented x 3 with fluent speech, no focal motor/sensory deficits  LABORATORY DATA:  I have reviewed the data as listed     Component Value Date/Time   NA 144 10/15/2018 0823   NA 143 01/03/2018 1108   K 4.4 10/15/2018 0823   CL 105 10/15/2018 0823   CO2 27 10/15/2018 0823   GLUCOSE 91 10/15/2018 0823   BUN 15 10/15/2018 0823   BUN 19 01/03/2018 1108   CREATININE 1.04 (H) 10/15/2018 0823   CREATININE 1.01 (H) 09/09/2018 0913   CREATININE 0.85 08/22/2014 1031   CALCIUM 9.7 10/15/2018 0823   PROT 6.3 (L) 10/15/2018 0823   PROT 6.2 01/03/2018 1108   ALBUMIN 3.4 (L) 10/15/2018 0823   ALBUMIN 3.9 01/03/2018 1108   AST 15 10/15/2018 0823   AST 15 04/18/2018  1110   ALT 6 10/15/2018 0823   ALT <6 04/18/2018 1110   ALKPHOS 64 10/15/2018 0823   BILITOT 0.3 10/15/2018 0823   BILITOT 0.3 04/18/2018 1110   GFRNONAA 58 (L) 10/15/2018 0823   GFRNONAA 60 (L) 09/09/2018 0913   GFRAA >60 10/15/2018 0823   GFRAA >60 09/09/2018 0913    No results found for: SPEP, UPEP  Lab Results  Component Value Date   WBC 3.8 (L) 02/10/2021   NEUTROABS 2.4 02/10/2021   HGB 12.0 02/10/2021   HCT 37.9 02/10/2021   MCV 98.7 02/10/2021   PLT 61 (L) 02/10/2021      Chemistry      Component Value Date/Time   NA 144 10/15/2018 0823   NA 143 01/03/2018 1108   K 4.4 10/15/2018 0823   CL 105 10/15/2018 0823   CO2 27 10/15/2018 0823   BUN 15 10/15/2018 0823   BUN 19 01/03/2018 1108   CREATININE 1.04 (H) 10/15/2018 0823   CREATININE 1.01 (H) 09/09/2018 0913   CREATININE 0.85 08/22/2014 1031      Component Value Date/Time   CALCIUM 9.7 10/15/2018 0823   ALKPHOS 64 10/15/2018 0823   AST 15 10/15/2018 0823   AST 15 04/18/2018 1110   ALT 6 10/15/2018 0823   ALT <6 04/18/2018 1110   BILITOT 0.3 10/15/2018 0823   BILITOT 0.3 04/18/2018 1110

## 2021-02-10 NOTE — Assessment & Plan Note (Signed)
Her repeat vitamin B12 level is adequate She will continue oral supplement

## 2021-02-28 ENCOUNTER — Other Ambulatory Visit: Payer: Self-pay | Admitting: Hematology and Oncology

## 2021-02-28 ENCOUNTER — Telehealth: Payer: Self-pay

## 2021-02-28 DIAGNOSIS — D693 Immune thrombocytopenic purpura: Secondary | ICD-10-CM

## 2021-02-28 NOTE — Telephone Encounter (Signed)
Called and given below message to [place]. She verbalized understanding.

## 2021-02-28 NOTE — Telephone Encounter (Signed)
Brother, Enrique Weiss called and left a message. He is choosing for his sister Heather [name] to have the bone marrow biopsy.

## 2021-02-28 NOTE — Telephone Encounter (Signed)
Call [place], tell her I have ordered sedated CT biopsy and they will call her I will schedule follow-up visit in about 2 weeks once it is scheduled

## 2021-03-01 ENCOUNTER — Telehealth (HOSPITAL_COMMUNITY): Payer: Self-pay

## 2021-03-02 ENCOUNTER — Telehealth: Payer: Self-pay | Admitting: Hematology and Oncology

## 2021-03-02 NOTE — Telephone Encounter (Signed)
Scheduled appointment per 03/09 schedule message. Contacted patients poa beverly, patient is aware.

## 2021-03-16 ENCOUNTER — Other Ambulatory Visit: Payer: Self-pay | Admitting: Physician Assistant

## 2021-03-17 ENCOUNTER — Other Ambulatory Visit: Payer: Self-pay

## 2021-03-17 ENCOUNTER — Encounter (HOSPITAL_COMMUNITY): Payer: Self-pay

## 2021-03-17 ENCOUNTER — Ambulatory Visit (HOSPITAL_COMMUNITY)
Admission: RE | Admit: 2021-03-17 | Discharge: 2021-03-17 | Disposition: A | Discharge status: Home / Self Care | Payer: Medicare Other | Source: Ambulatory Visit | Attending: Hematology and Oncology | Admitting: Hematology and Oncology

## 2021-03-17 DIAGNOSIS — D696 Thrombocytopenia, unspecified: Secondary | ICD-10-CM | POA: Diagnosis not present

## 2021-03-17 DIAGNOSIS — D693 Immune thrombocytopenic purpura: Secondary | ICD-10-CM | POA: Diagnosis not present

## 2021-03-17 DIAGNOSIS — D61818 Other pancytopenia: Secondary | ICD-10-CM | POA: Insufficient documentation

## 2021-03-17 DIAGNOSIS — D72819 Decreased white blood cell count, unspecified: Secondary | ICD-10-CM | POA: Diagnosis not present

## 2021-03-17 DIAGNOSIS — E538 Deficiency of other specified B group vitamins: Secondary | ICD-10-CM | POA: Insufficient documentation

## 2021-03-17 LAB — CBC WITH DIFFERENTIAL/PLATELET
Abs Immature Granulocytes: 0 10*3/uL (ref 0.00–0.07)
Basophils Absolute: 0 10*3/uL (ref 0.0–0.1)
Basophils Relative: 0 %
Eosinophils Absolute: 0 10*3/uL (ref 0.0–0.5)
Eosinophils Relative: 0 %
HCT: 40.6 % (ref 36.0–46.0)
Hemoglobin: 13.2 g/dL (ref 12.0–15.0)
Immature Granulocytes: 0 %
Lymphocytes Relative: 43 %
Lymphs Abs: 1.5 10*3/uL (ref 0.7–4.0)
MCH: 32.5 pg (ref 26.0–34.0)
MCHC: 32.5 g/dL (ref 30.0–36.0)
MCV: 100 fL (ref 80.0–100.0)
Monocytes Absolute: 0.3 10*3/uL (ref 0.1–1.0)
Monocytes Relative: 8 %
Neutro Abs: 1.7 10*3/uL (ref 1.7–7.7)
Neutrophils Relative %: 49 %
Platelets: 73 10*3/uL — ABNORMAL LOW (ref 150–400)
RBC: 4.06 MIL/uL (ref 3.87–5.11)
RDW: 13.3 % (ref 11.5–15.5)
WBC: 3.5 10*3/uL — ABNORMAL LOW (ref 4.0–10.5)
nRBC: 0 % (ref 0.0–0.2)

## 2021-03-17 MED ORDER — MIDAZOLAM HCL 2 MG/2ML IJ SOLN
INTRAMUSCULAR | Status: AC
  Filled 2021-03-17: qty 4

## 2021-03-17 MED ORDER — FENTANYL CITRATE (PF) 100 MCG/2ML IJ SOLN
INTRAMUSCULAR | Status: AC
  Filled 2021-03-17: qty 2

## 2021-03-17 MED ORDER — NALOXONE HCL 0.4 MG/ML IJ SOLN
INTRAMUSCULAR | Status: AC
  Filled 2021-03-17: qty 1

## 2021-03-17 MED ORDER — MIDAZOLAM HCL 2 MG/2ML IJ SOLN
INTRAMUSCULAR | Status: AC | PRN
  Administered 2021-03-17: 1 mg via INTRAVENOUS

## 2021-03-17 MED ORDER — FENTANYL CITRATE (PF) 100 MCG/2ML IJ SOLN
INTRAMUSCULAR | Status: AC | PRN
  Administered 2021-03-17: 25 ug via INTRAVENOUS
  Administered 2021-03-17: 50 ug via INTRAVENOUS

## 2021-03-17 MED ORDER — FLUMAZENIL 0.5 MG/5ML IV SOLN
INTRAVENOUS | Status: AC
  Filled 2021-03-17: qty 5

## 2021-03-17 NOTE — Consult Note (Signed)
Chief Complaint: Patient was seen in consultation today for pancytopenia/bone marrow biopsy and aspiration.   Referring Physician(s): Gorsuch,Ni (oncology)  Supervising Physician: Mir, Sharen Heck  Patient Status: [facility] - Out-pt  History of Present Illness: Heather [name] is a 60 y.o. female with a past medical history of mild mental defect, ITP, and depression. She was found to have abnormal CBC on routine blood work and was referred to hematology/oncology for further management. Further work-up revealed pancytopenia.  IR consulted by Dr. Alvy Bimler for possible image-guided bone marrow biopsy/aspriation. Patient awake and alert sitting in bed with no complaints at this time.   Past Medical History:  Diagnosis Date  . Allergy   . Anxiety    history restored after error with record merge      . Arthritis    history restored after error with record merge      . Depression   . Fatty infiltration of liver    history restored after error with record merge      . Intestinal obstruction (HCC)    as an infant history restored after error with record merge      . Mental retardation    history restored after error with record merge        Past Surgical History:  Procedure Laterality Date  . ABDOMINAL HYSTERECTOMY    . COLONOSCOPY    . fused knee    . KNEE DISLOCATION SURGERY    . POLYPECTOMY    . SIGMOIDOSCOPY      Allergies: Patient has no known allergies.  Medications: Prior to Admission medications   Medication Sig Start Date End Date Taking? Authorizing Provider  cholecalciferol (VITAMIN D3) 25 MCG (1000 UNIT) tablet Take 2,000 Units by mouth daily.   Yes [provider]  desvenlafaxine (PRISTIQ) 50 MG 24 hr tablet Take 1 tablet (50 mg total) by mouth daily. 03/12/18  Yes [name], MD  gabapentin (NEURONTIN) 300 MG capsule Take 300 mg by mouth at bedtime. 02/07/20  Yes [provider]  meloxicam (MOBIC) 7.5 MG tablet Take 15 mg by mouth daily.  02/25/20  Yes [provider]  MYRBETRIQ 25 MG TB24 tablet Take 25 mg by mouth daily. 02/29/20  Yes [provider]  predniSONE (DELTASONE) 2.5 MG tablet Take 2.5 mg twice a week by mouth 05/20/20  Yes Gorsuch, Ni, MD  traMADol (ULTRAM) 50 MG tablet Take 1 tablet (50 mg total) by mouth every 6 (six) hours as needed. 03/09/20  Yes Fredia Sorrow, MD  triamcinolone cream (KENALOG) 0.1 % Apply thin layer to affected area TID PRN itching 05/04/17  Yes Jeffery, Domingo Mend, PA  vitamin B-12 (CYANOCOBALAMIN) 1000 MCG tablet Take 1,000 mcg by mouth daily.   Yes [provider]     Family History  Problem Relation Age of Onset  . Diabetes Mother        history restored after error with record merge      . Colon cancer Neg Hx     Social History   Socioeconomic History  . Marital status: Single    Spouse name: n/a  . Number of children: 0  . Years of education: Not on file  . Highest education level: Not on file  Occupational History  . Occupation: [employer]    Employer: DISABLED  Tobacco Use  . Smoking status: Never Smoker  . Smokeless tobacco: Never Used  Vaping Use  . Vaping Use: Never used  Substance and Sexual Activity  . Alcohol use:  No    Alcohol/week: 0.0 standard drinks  . Drug use: No  . Sexual activity: Not on file  Other Topics Concern  . Not on file  Social History Narrative   Lives with Heather [name], a close friend of her mother, and Heather [name]'s adult son.       Social Determinants of Health   Financial Resource Strain: Not on file  Food Insecurity: Not on file  Transportation Needs: Not on file  Physical Activity: Not on file  Stress: Not on file  Social Connections: Not on file     Review of Systems: A 12 point ROS discussed and pertinent positives are indicated in the HPI above.  All other systems are negative.  Review of Systems  Constitutional: Negative for chills and fever.  Respiratory: Negative for shortness of breath and  wheezing.   Cardiovascular: Negative for chest pain and palpitations.  Gastrointestinal: Negative for abdominal pain.  Neurological: Negative for headaches.  Psychiatric/Behavioral: Negative for behavioral problems and confusion.    Vital Signs: BP 117/62 (BP Location: Right Arm)   Pulse 77   Temp 99 F (37.2 C) (Oral)   Resp 18   Ht '4\' 11"'  (1.499 m)   SpO2 100%   BMI 25.17 kg/m   Physical Exam Constitutional:      General: She is not in acute distress.    Appearance: Normal appearance.  Cardiovascular:     Rate and Rhythm: Normal rate and regular rhythm.     Heart sounds: Normal heart sounds. No murmur heard.   Pulmonary:     Effort: Pulmonary effort is normal. No respiratory distress.     Breath sounds: Normal breath sounds. No wheezing.  Skin:    General: Skin is warm and dry.  Neurological:     Mental Status: She is alert and oriented to person, place, and time.      MD Evaluation Airway: WNL Heart: WNL Abdomen: WNL Chest/ Lungs: WNL ASA  Classification: 2 Mallampati/Airway Score: Two   Imaging: No results found.  Labs:  CBC: Recent Labs    04/15/20 0939 05/20/20 0907 09/09/20 0944 02/10/21 0951  WBC 4.0 4.9 4.2 3.8*  HGB 12.1 11.9* 11.0* 12.0  HCT 37.9 37.0 34.7* 37.9  PLT 65* 79* 63* 61*     Assessment and Plan:  Pancytopenia. Plan for image-guided bone marrow biopsy/aspiration today in IR. Patient is NPO. Afebrile. CBC with differential ordered for this AM.  Risks and benefits discussed with the patient including, but not limited to bleeding, infection, damage to adjacent structures or low yield requiring additional tests. All of the patient's questions were answered, patient is agreeable to proceed. Consent signed and in chart.   Thank you for this interesting consult.  I greatly enjoyed meeting [name] and look forward to participating in their care.  A copy of this report was sent to the requesting provider on this  date.  Electronically Signed: Earley Abide, PA-C 03/17/2021, 8:36 AM   I spent a total of 15 Minutes in face to face in clinical consultation, greater than 50% of which was counseling/coordinating care for pancytopenia/bone marrow biopsy and aspiration.

## 2021-03-17 NOTE — Procedures (Signed)
Interventional Radiology Procedure Note  Procedure: Bone marrow aspiration and biopsy  Indication: Pancytopenia  Findings: Please refer to procedural dictation for full description.  Complications: None  EBL: < 10 mL  Miachel Roux, MD [phone]

## 2021-03-17 NOTE — Discharge Instructions (Signed)
Please call Interventional Radiology clinic [phone] with any questions or concerns.  You may remove your dressing and shower tomorrow.   Bone Marrow Aspiration and Bone Marrow Biopsy, Adult, Care After This sheet gives you information about how to care for yourself after your procedure. Your health care provider may also give you more specific instructions. If you have problems or questions, contact your health care provider. What can I expect after the procedure? After the procedure, it is common to have:  Mild pain and tenderness.  Swelling.  Bruising. Follow these instructions at home: Puncture site care  Follow instructions from your health care provider about how to take care of the puncture site. Make sure you: ? Wash your hands with soap and water before and after you change your bandage (dressing). If soap and water are not available, use hand sanitizer. ? Change your dressing as told by your health care provider.  Check your puncture site every day for signs of infection. Check for: ? More redness, swelling, or pain. ? Fluid or blood. ? Warmth. ? Pus or a bad smell.   Activity  Return to your normal activities as told by your health care provider. Ask your health care provider what activities are safe for you.  Do not lift anything that is heavier than 10 lb (4.5 kg), or the limit that you are told, until your health care provider says that it is safe.  Do not drive for 24 hours if you were given a sedative during your procedure. General instructions  Take over-the-counter and prescription medicines only as told by your health care provider.  Do not take baths, swim, or use a hot tub until your health care provider approves. Ask your health care provider if you may take showers. You may only be allowed to take sponge baths.  If directed, put ice on the affected area. To do this: ? Put ice in a plastic bag. ? Place a towel between your skin and the bag. ? Leave  the ice on for 20 minutes, 2-3 times a day.  Keep all follow-up visits as told by your health care provider. This is important.   Contact a health care provider if:  Your pain is not controlled with medicine.  You have a fever.  You have more redness, swelling, or pain around the puncture site.  You have fluid or blood coming from the puncture site.  Your puncture site feels warm to the touch.  You have pus or a bad smell coming from the puncture site. Summary  After the procedure, it is common to have mild pain, tenderness, swelling, and bruising.  Follow instructions from your health care provider about how to take care of the puncture site and what activities are safe for you.  Take over-the-counter and prescription medicines only as told by your health care provider.  Contact a health care provider if you have any signs of infection, such as fluid or blood coming from the puncture site. This information is not intended to replace advice given to you by your health care provider. Make sure you discuss any questions you have with your health care provider. Document Revised: 04/29/2019 Document Reviewed: 04/29/2019 Elsevier Patient Education  2021 Elsevier Inc.   Moderate Conscious Sedation, Adult, Care After This sheet gives you information about how to care for yourself after your procedure. Your health care provider may also give you more specific instructions. If you have problems or questions, contact your health care provider. What   soon. °Follow these instructions at home: °For the time period you were told by your health care provider: °Rest. °Do not participate in activities where you could fall or become injured. °Do not drive or use machinery. °Do not drink alcohol. °Do not take sleeping pills or medicines that cause drowsiness. °Do not  make important decisions or sign legal documents. °Do not take care of children on your own.  °  °  °Eating and drinking °Follow the diet recommended by your health care provider. °Drink enough fluid to keep your urine pale yellow. °If you vomit: °Drink water, juice, or soup when you can drink without vomiting. °Make sure you have little or no nausea before eating solid foods.   °General instructions °Take over-the-counter and prescription medicines only as told by your health care provider. °Have a responsible adult stay with you for the time you are told. It is important to have someone help care for you until you are awake and alert. °Do not smoke. °Keep all follow-up visits as told by your health care provider. This is important. °Contact a health care provider if: °You are still sleepy or having trouble with balance after 24 hours. °You feel light-headed. °You keep feeling nauseous or you keep vomiting. °You develop a rash. °You have a fever. °You have redness or swelling around the IV site. °Get help right away if: °You have trouble breathing. °You have new-onset confusion at home. °Summary °After the procedure, it is common to feel sleepy, have impaired judgment, or feel nauseous if you eat too soon. °Rest after you get home. Know the things you should not do after the procedure. °Follow the diet recommended by your health care provider and drink enough fluid to keep your urine pale yellow. °Get help right away if you have trouble breathing or new-onset confusion at home. °This information is not intended to replace advice given to you by your health care provider. Make sure you discuss any questions you have with your health care provider. °Document Revised: 04/09/2020 Document Reviewed: 11/06/2019 °Elsevier Patient Education © 2021 Elsevier Inc.  °

## 2021-03-18 LAB — SURGICAL PATHOLOGY

## 2021-03-24 ENCOUNTER — Encounter (HOSPITAL_COMMUNITY): Payer: Self-pay

## 2021-04-04 ENCOUNTER — Other Ambulatory Visit: Payer: Medicare Other

## 2021-04-04 ENCOUNTER — Ambulatory Visit: Payer: Medicare Other | Admitting: Hematology and Oncology

## 2021-04-07 ENCOUNTER — Telehealth: Payer: Self-pay

## 2021-04-07 ENCOUNTER — Other Ambulatory Visit: Payer: Self-pay

## 2021-04-07 ENCOUNTER — Inpatient Hospital Stay (HOSPITAL_BASED_OUTPATIENT_CLINIC_OR_DEPARTMENT_OTHER): Payer: Medicare Other | Admitting: Hematology and Oncology

## 2021-04-07 ENCOUNTER — Encounter: Payer: Self-pay | Admitting: Hematology and Oncology

## 2021-04-07 ENCOUNTER — Inpatient Hospital Stay: Payer: Medicare Other | Attending: Hematology and Oncology

## 2021-04-07 VITALS — BP 108/61 | HR 72 | Temp 97.7°F | Resp 18 | Ht 59.0 in | Wt 123.6 lb

## 2021-04-07 DIAGNOSIS — M21961 Unspecified acquired deformity of right lower leg: Secondary | ICD-10-CM | POA: Insufficient documentation

## 2021-04-07 DIAGNOSIS — R768 Other specified abnormal immunological findings in serum: Secondary | ICD-10-CM | POA: Diagnosis not present

## 2021-04-07 DIAGNOSIS — M4180 Other forms of scoliosis, site unspecified: Secondary | ICD-10-CM

## 2021-04-07 DIAGNOSIS — D72819 Decreased white blood cell count, unspecified: Secondary | ICD-10-CM | POA: Diagnosis not present

## 2021-04-07 DIAGNOSIS — F70 Mild intellectual disabilities: Secondary | ICD-10-CM | POA: Diagnosis not present

## 2021-04-07 DIAGNOSIS — D693 Immune thrombocytopenic purpura: Secondary | ICD-10-CM | POA: Insufficient documentation

## 2021-04-07 DIAGNOSIS — E538 Deficiency of other specified B group vitamins: Secondary | ICD-10-CM | POA: Diagnosis not present

## 2021-04-07 DIAGNOSIS — D61818 Other pancytopenia: Secondary | ICD-10-CM | POA: Insufficient documentation

## 2021-04-07 DIAGNOSIS — F79 Unspecified intellectual disabilities: Secondary | ICD-10-CM

## 2021-04-07 DIAGNOSIS — M419 Scoliosis, unspecified: Secondary | ICD-10-CM | POA: Insufficient documentation

## 2021-04-07 LAB — CBC WITH DIFFERENTIAL/PLATELET
Abs Immature Granulocytes: 0.01 10*3/uL (ref 0.00–0.07)
Basophils Absolute: 0 10*3/uL (ref 0.0–0.1)
Basophils Relative: 1 %
Eosinophils Absolute: 0 10*3/uL (ref 0.0–0.5)
Eosinophils Relative: 0 %
HCT: 36.9 % (ref 36.0–46.0)
Hemoglobin: 11.7 g/dL — ABNORMAL LOW (ref 12.0–15.0)
Immature Granulocytes: 0 %
Lymphocytes Relative: 49 %
Lymphs Abs: 1.5 10*3/uL (ref 0.7–4.0)
MCH: 31.8 pg (ref 26.0–34.0)
MCHC: 31.7 g/dL (ref 30.0–36.0)
MCV: 100.3 fL — ABNORMAL HIGH (ref 80.0–100.0)
Monocytes Absolute: 0.2 10*3/uL (ref 0.1–1.0)
Monocytes Relative: 7 %
Neutro Abs: 1.3 10*3/uL — ABNORMAL LOW (ref 1.7–7.7)
Neutrophils Relative %: 43 %
Platelets: 71 10*3/uL — ABNORMAL LOW (ref 150–400)
RBC: 3.68 MIL/uL — ABNORMAL LOW (ref 3.87–5.11)
RDW: 12.9 % (ref 11.5–15.5)
WBC: 3 10*3/uL — ABNORMAL LOW (ref 4.0–10.5)
nRBC: 0 % (ref 0.0–0.2)

## 2021-04-07 LAB — VITAMIN B12: Vitamin B-12: 2866 pg/mL — ABNORMAL HIGH (ref 180–914)

## 2021-04-07 NOTE — Assessment & Plan Note (Addendum)
She is not symptomatic. Observe for now 

## 2021-04-07 NOTE — Progress Notes (Signed)
START OFF PATHWAY REGIMEN - Other   [id number]:Rituximab (IV/Subcut) D1 Weekly:   A cycle is every 7 days:     Rituximab-xxxx      Rituximab and hyaluronidase human   **Always confirm dose/schedule in your pharmacy ordering system**  **Administration Notes: For chronic ITP, non malignant  Patient Characteristics: Intent of Therapy: Non-Curative / Palliative Intent, Discussed with Patient

## 2021-04-07 NOTE — Telephone Encounter (Signed)
-----   Message from [name], MD sent at 04/07/2021  1:39 PM EDT ----- Regarding: call [name], care giver Pls let her know B12 level is high We can stop B12 injection for some time Also, please print a copy of test results today and my clinic notes and mail a copy to [facility]

## 2021-04-07 NOTE — Assessment & Plan Note (Signed)
Her prior positive ANA test could indicate some sort of autoimmune disorder and rituximab would be an appropriate treatment for that

## 2021-04-07 NOTE — Assessment & Plan Note (Addendum)
She has history of vitamin B12 deficiency Recheck vitamin B12 is adequate She does not need vitamin B-12 injection now Plan to recheck in 6 months

## 2021-04-07 NOTE — Assessment & Plan Note (Signed)
I have reviewed results of her most recent bone marrow biopsy with the caregiver and gave her a copy I explained the pathophysiology of pancytopenia and appropriate response that should be generated in her bone marrow She has mildly hypocellular bone marrow overall but without any signs of dysplasia Unfortunately, molecular studies were not performed due to insufficient cells.  We discussed the risks, benefits, side effects of prednisone therapy, rituximab or thrombopoietin stimulating agents with either Nplate or Promacta After much discussion, her caregiver felt that rituximab would be the most appropriate choice of treatment option In the past, she had abnormal screening test for autoimmune disorders I felt that rituximab would be appropriate for long-term plan I expect the chance that she respond to rituximab is around 60% or so and when she start responding to treatment, we can get her off prednisone The risk, benefits, side effects of rituximab were fully discussed today in her caregiver felt she is ready to proceed She will come back to get her blood work done including hepatitis screen prior to the first day of treatment I will see her before second and fourth treatment

## 2021-04-07 NOTE — Assessment & Plan Note (Signed)
She has scoliosis and chronic deformed right leg It would be difficult for the patient to tolerate all her infusions sitting on a regular infusion chair I will try to make accommodation for her to be treated in the bed

## 2021-04-07 NOTE — Progress Notes (Signed)
[facility] OFFICE PROGRESS NOTE  Heather [name], [place]  ASSESSMENT & PLAN:  Chronic ITP (idiopathic thrombocytopenia) (HCC) I have reviewed results of her most recent bone marrow biopsy with the caregiver and gave her a copy I explained the pathophysiology of pancytopenia and appropriate response that should be generated in her bone marrow She has mildly hypocellular bone marrow overall but without any signs of dysplasia Unfortunately, molecular studies were not performed due to insufficient cells.  We discussed the risks, benefits, side effects of prednisone therapy, rituximab or thrombopoietin stimulating agents with either Nplate or Promacta After much discussion, her caregiver felt that rituximab would be the most appropriate choice of treatment option In the past, she had abnormal screening test for autoimmune disorders I felt that rituximab would be appropriate for long-term plan I expect the chance that she respond to rituximab is around 60% or so and when she start responding to treatment, we can get her off prednisone The risk, benefits, side effects of rituximab were fully discussed today in her caregiver felt she is ready to proceed She will come back to get her blood work done including hepatitis screen prior to the first day of treatment I will see her before second and fourth treatment  Positive ANA (antinuclear antibody) Her prior positive ANA test could indicate some sort of autoimmune disorder and rituximab would be an appropriate treatment for that  Intellectual disability Due to her intellectual disability, she cannot make informed decision by herself Her caregiver would have to sign consent form for her in the future  Chronic leukopenia She is not symptomatic Observe for now  Scoliosis She has scoliosis and chronic deformed right leg It would be difficult for the patient to tolerate all her infusions sitting on a regular infusion chair I will try  to make accommodation for her to be treated in the bed  Vitamin B12 deficiency She has history of vitamin B12 deficiency Recheck vitamin B12 is adequate She does not need vitamin B-12 injection now Plan to recheck in 6 months   Orders Placed This Encounter  Procedures  . Hepatitis B surface antigen    Standing Status:   Standing    Number of Occurrences:   1    Standing Expiration Date:   04/07/2022  . Hepatitis B core antibody, total    Standing Status:   Standing    Number of Occurrences:   1    Standing Expiration Date:   04/07/2022  . Comprehensive metabolic panel    Standing Status:   Standing    Number of Occurrences:   33    Standing Expiration Date:   04/07/2022    The total time spent in the appointment was 40 minutes encounter with patients including review of chart and various tests results, discussions about plan of care and coordination of care plan   All questions were answered. The patient knows to call the clinic with any problems, questions or concerns. No barriers to learning was detected.    [name], MD 4/14/20221:38 PM  INTERVAL HISTORY: Heather [name] 60 y.o. female returns for follow-up on chronic pancytopenia She is here accompanied by her caregiver, Heather [name] The patient complaining of some soreness on her hip after bone marrow biopsy but is doing well no  SUMMARY OF HEMATOLOGIC HISTORY:  [name] is here because of thrombocytopenia. She has mild mental deficit.  Her background history is not clear. She lives with Heather [name] for 4 years.  She  was found to have abnormal CBC from recent blood work. She has normal CBC as of last year. Most recently, repeat CBC show mild leukopenia with a platelet count of 89,000.  That was subsequently repeated and it got even lower to 69,000. She had discovered recent bruising but denies spontaneous epistaxis, hematuria, melena or hematochezia The patient denies history of liver disease, exposure to heparin,  history of cardiac murmur/prior cardiovascular surgery or recent new medications She had received blood transfusion in the past. Although the patient denies a history of liver disease, ultrasound of the abdomen from April 2012 revealed fatty infiltration of the liver, suspicious for possible fatty liver disease versus other liver condition. She denies recent new medications.  She denies recent infection. Further workup revealed evidence of liver lesion on CT imaging and positive ANA screen. She was being observed from the hematology standpoint to progression of pancytopenia On 09/09/2018, she was started on 40 mg of prednisone daily On October 1st, 2019, the dose of prednisone is reduced to 20 mg daily On October 01, 2018, dose of prednisone is reduced to 10 mg daily On October 08, 2018, the dose of prednisone is reduced to 5 mg Bone density scan in November 2019 showed osteopenia with T score of -2.4 On March 06, 2019, the dose of prednisone is reduced to 2.5 mg daily On March 24, 2019, the dose of prednisone is adjusted to 2.5 mg alternate with 5 mg every other day Starting in February, dose of prednisone is reduced to 2.5 mg daily Starting 03/18/2020, prednisone dose is reduced to every other day at 2.5 mg and then twice a week.  She was found to have vitamin B12 deficiency and started taking oral vitamin B12 supplementation On 03/17/2021, she underwent bone marrow aspirate and biopsy which showed mild hypocellular bone marrow without dysplasia  I have reviewed the past medical history, past surgical history, social history and family history with the patient and they are unchanged from previous note.  ALLERGIES:  has No Known Allergies.  MEDICATIONS:  Current Outpatient Medications  Medication Sig Dispense Refill  . cholecalciferol (VITAMIN D3) 25 MCG (1000 UNIT) tablet Take 2,000 Units by mouth daily.    Marland Kitchen desvenlafaxine (PRISTIQ) 50 MG 24 hr tablet Take 1 tablet (50 mg total) by mouth  daily. 30 tablet 0  . gabapentin (NEURONTIN) 300 MG capsule Take 300 mg by mouth at bedtime.    . meloxicam (MOBIC) 7.5 MG tablet Take 15 mg by mouth daily.    Marland Kitchen MYRBETRIQ 25 MG TB24 tablet Take 25 mg by mouth daily.    . predniSONE (DELTASONE) 2.5 MG tablet Take 2.5 mg twice a week by mouth 30 tablet 9  . traMADol (ULTRAM) 50 MG tablet Take 1 tablet (50 mg total) by mouth every 6 (six) hours as needed. 15 tablet 0  . triamcinolone cream (KENALOG) 0.1 % Apply thin layer to affected area TID PRN itching 30 g 0  . vitamin B-12 (CYANOCOBALAMIN) 1000 MCG tablet Take 1,000 mcg by mouth daily.     No current facility-administered medications for this visit.     REVIEW OF SYSTEMS:   Constitutional: Denies fevers, chills or night sweats Eyes: Denies blurriness of vision Ears, nose, mouth, throat, and face: Denies mucositis or sore throat Respiratory: Denies cough, dyspnea or wheezes Cardiovascular: Denies palpitation, chest discomfort or lower extremity swelling Gastrointestinal:  Denies nausea, heartburn or change in bowel habits Skin: Denies abnormal skin rashes Lymphatics: Denies new lymphadenopathy or easy bruising Neurological:Denies  numbness, tingling or new weaknesses Behavioral/Psych: Mood is stable, no new changes  All other systems were reviewed with the patient and are negative.  PHYSICAL EXAMINATION: ECOG PERFORMANCE STATUS: 0 - Asymptomatic  Vitals:   04/07/21 1057  BP: 108/61  Pulse: 72  Resp: 18  Temp: 97.7 F (36.5 C)  SpO2: 99%   Filed Weights   04/07/21 1057  Weight: 123 lb 9.6 oz (56.1 kg)    GENERAL:alert, no distress and comfortable NEURO: alert & oriented x 3 with fluent speech  LABORATORY DATA:  I have reviewed the data as listed     Component Value Date/Time   NA 144 10/15/2018 0823   NA 143 01/03/2018 1108   K 4.4 10/15/2018 0823   CL 105 10/15/2018 0823   CO2 27 10/15/2018 0823   GLUCOSE 91 10/15/2018 0823   BUN 15 10/15/2018 0823   BUN 19  01/03/2018 1108   CREATININE 1.04 (H) 10/15/2018 0823   CREATININE 1.01 (H) 09/09/2018 0913   CREATININE 0.85 08/22/2014 1031   CALCIUM 9.7 10/15/2018 0823   PROT 6.3 (L) 10/15/2018 0823   PROT 6.2 01/03/2018 1108   ALBUMIN 3.4 (L) 10/15/2018 0823   ALBUMIN 3.9 01/03/2018 1108   AST 15 10/15/2018 0823   AST 15 04/18/2018 1110   ALT 6 10/15/2018 0823   ALT <6 04/18/2018 1110   ALKPHOS 64 10/15/2018 0823   BILITOT 0.3 10/15/2018 0823   BILITOT 0.3 04/18/2018 1110   GFRNONAA 58 (L) 10/15/2018 0823   GFRNONAA 60 (L) 09/09/2018 0913   GFRAA >60 10/15/2018 0823   GFRAA >60 09/09/2018 0913    No results found for: SPEP, UPEP  Lab Results  Component Value Date   WBC 3.0 (L) 04/07/2021   NEUTROABS 1.3 (L) 04/07/2021   HGB 11.7 (L) 04/07/2021   HCT 36.9 04/07/2021   MCV 100.3 (H) 04/07/2021   PLT 71 (L) 04/07/2021      Chemistry      Component Value Date/Time   NA 144 10/15/2018 0823   NA 143 01/03/2018 1108   K 4.4 10/15/2018 0823   CL 105 10/15/2018 0823   CO2 27 10/15/2018 0823   BUN 15 10/15/2018 0823   BUN 19 01/03/2018 1108   CREATININE 1.04 (H) 10/15/2018 0823   CREATININE 1.01 (H) 09/09/2018 0913   CREATININE 0.85 08/22/2014 1031      Component Value Date/Time   CALCIUM 9.7 10/15/2018 0823   ALKPHOS 64 10/15/2018 0823   AST 15 10/15/2018 0823   AST 15 04/18/2018 1110   ALT 6 10/15/2018 0823   ALT <6 04/18/2018 1110   BILITOT 0.3 10/15/2018 0823   BILITOT 0.3 04/18/2018 1110

## 2021-04-07 NOTE — Assessment & Plan Note (Signed)
Due to her intellectual disability, she cannot make informed decision by herself Her caregiver would have to sign consent form for her in the future

## 2021-04-07 NOTE — Telephone Encounter (Signed)
Patient's caregiver, [name] notified.  Confirmed with MD to hold B12 oral supplements as well.   Copy of labs/progress notes mailed.

## 2021-04-21 NOTE — Progress Notes (Signed)
Pharmacist Chemotherapy Monitoring - Initial Assessment    Anticipated start date: 04/28/2021   Regimen:  . Are orders appropriate based on the patient's diagnosis, regimen, and cycle? Yes .  Marland Kitchen Does the plan date match the patient's scheduled date? Yes . Is the sequencing of drugs appropriate? Yes . Are the premedications appropriate for the patient's regimen? Yes . Prior Authorization for treatment is: Approved o If applicable, is the correct biosimilar selected based on the patient's insurance? yes  Organ Function and Labs: Marland Kitchen Are dose adjustments needed based on the patient's renal function, hepatic function, or hematologic function? Yes . Are appropriate labs ordered prior to the start of patient's treatment? Yes . Other organ system assessment, if indicated: N/A . The following baseline labs, if indicated, have been ordered: rituximab: baseline Hepatitis B labs  Dose Assessment: . Are the drug doses appropriate? Yes . Are the following correct: o Drug concentrations Yes o IV fluid compatible with drug Yes o Administration routes Yes o Timing of therapy Yes . If applicable, does the patient have documented access for treatment and/or plans for port-a-cath placement? not applicable . If applicable, have lifetime cumulative doses been properly documented and assessed? not applicable Lifetime Dose Tracking  No doses have been documented on this patient for the following tracked chemicals: Doxorubicin, Epirubicin, Idarubicin, Daunorubicin, Mitoxantrone, Bleomycin, Oxaliplatin, Carboplatin, Liposomal Doxorubicin  o   Toxicity Monitoring/Prevention: . The patient has the following take home antiemetics prescribed: N/A . The patient has the following take home medications prescribed: N/A . Medication allergies and previous infusion related reactions, if applicable, have been reviewed and addressed. No . [name] current medication list has been assessed for drug-drug interactions  with their chemotherapy regimen. no significant drug-drug interactions were identified on review.  Order Review: . Are the treatment plan orders signed? No . Is the patient scheduled to see a provider prior to their treatment? No  I verify that I have reviewed each item in the above checklist and answered each question accordingly.  Larene Beach, RPH, 04/21/2021  11:46 AM

## 2021-04-25 ENCOUNTER — Inpatient Hospital Stay: Payer: Medicare Other | Attending: Hematology and Oncology

## 2021-04-25 ENCOUNTER — Other Ambulatory Visit: Payer: Self-pay

## 2021-04-25 DIAGNOSIS — M419 Scoliosis, unspecified: Secondary | ICD-10-CM | POA: Diagnosis not present

## 2021-04-25 DIAGNOSIS — F70 Mild intellectual disabilities: Secondary | ICD-10-CM | POA: Diagnosis not present

## 2021-04-25 DIAGNOSIS — D693 Immune thrombocytopenic purpura: Secondary | ICD-10-CM | POA: Insufficient documentation

## 2021-04-25 DIAGNOSIS — E538 Deficiency of other specified B group vitamins: Secondary | ICD-10-CM | POA: Diagnosis not present

## 2021-04-25 DIAGNOSIS — M21961 Unspecified acquired deformity of right lower leg: Secondary | ICD-10-CM | POA: Diagnosis not present

## 2021-04-25 DIAGNOSIS — Z5112 Encounter for antineoplastic immunotherapy: Secondary | ICD-10-CM | POA: Diagnosis not present

## 2021-04-25 DIAGNOSIS — D61818 Other pancytopenia: Secondary | ICD-10-CM | POA: Diagnosis not present

## 2021-04-25 LAB — CBC WITH DIFFERENTIAL/PLATELET
Abs Immature Granulocytes: 0.01 10*3/uL (ref 0.00–0.07)
Basophils Absolute: 0 10*3/uL (ref 0.0–0.1)
Basophils Relative: 0 %
Eosinophils Absolute: 0 10*3/uL (ref 0.0–0.5)
Eosinophils Relative: 0 %
HCT: 34.8 % — ABNORMAL LOW (ref 36.0–46.0)
Hemoglobin: 11.5 g/dL — ABNORMAL LOW (ref 12.0–15.0)
Immature Granulocytes: 0 %
Lymphocytes Relative: 33 %
Lymphs Abs: 1.1 10*3/uL (ref 0.7–4.0)
MCH: 33 pg (ref 26.0–34.0)
MCHC: 33 g/dL (ref 30.0–36.0)
MCV: 100 fL (ref 80.0–100.0)
Monocytes Absolute: 0.2 10*3/uL (ref 0.1–1.0)
Monocytes Relative: 6 %
Neutro Abs: 2 10*3/uL (ref 1.7–7.7)
Neutrophils Relative %: 61 %
Platelets: 80 10*3/uL — ABNORMAL LOW (ref 150–400)
RBC: 3.48 MIL/uL — ABNORMAL LOW (ref 3.87–5.11)
RDW: 12.8 % (ref 11.5–15.5)
WBC: 3.3 10*3/uL — ABNORMAL LOW (ref 4.0–10.5)
nRBC: 0 % (ref 0.0–0.2)

## 2021-04-25 LAB — COMPREHENSIVE METABOLIC PANEL
ALT: <6 U/L (ref 0–44)
AST: 18 U/L (ref 15–41)
Albumin: 3.6 g/dL (ref 3.5–5.0)
Alkaline Phosphatase: 87 U/L (ref 38–126)
Anion gap: 10 (ref 5–15)
BUN: 26 mg/dL — ABNORMAL HIGH (ref 8–23)
CO2: 29 mmol/L (ref 22–32)
Calcium: 9.4 mg/dL (ref 8.9–10.3)
Chloride: 106 mmol/L (ref 98–111)
Creatinine, Ser: 0.92 mg/dL (ref 0.44–1.00)
GFR, Estimated: >60 mL/min (ref >60)
Glucose, Bld: 83 mg/dL (ref 70–99)
Potassium: 4.3 mmol/L (ref 3.5–5.1)
Sodium: 145 mmol/L (ref 135–145)
Total Bilirubin: 0.3 mg/dL (ref 0.3–1.2)
Total Protein: 6.4 g/dL — ABNORMAL LOW (ref 6.5–8.1)

## 2021-04-25 LAB — HEPATITIS B CORE ANTIBODY, TOTAL: Hep B Core Total Ab: NONREACTIVE

## 2021-04-25 LAB — HEPATITIS B SURFACE ANTIGEN: Hepatitis B Surface Ag: NONREACTIVE

## 2021-04-28 ENCOUNTER — Other Ambulatory Visit: Payer: Self-pay | Admitting: Hematology and Oncology

## 2021-04-28 ENCOUNTER — Other Ambulatory Visit: Payer: Self-pay

## 2021-04-28 ENCOUNTER — Inpatient Hospital Stay: Payer: Medicare Other

## 2021-04-28 VITALS — BP 103/57 | HR 73 | Temp 99.1°F | Resp 18 | Wt 121.0 lb

## 2021-04-28 DIAGNOSIS — M21961 Unspecified acquired deformity of right lower leg: Secondary | ICD-10-CM | POA: Diagnosis not present

## 2021-04-28 DIAGNOSIS — Z5112 Encounter for antineoplastic immunotherapy: Secondary | ICD-10-CM | POA: Diagnosis not present

## 2021-04-28 DIAGNOSIS — E538 Deficiency of other specified B group vitamins: Secondary | ICD-10-CM | POA: Diagnosis not present

## 2021-04-28 DIAGNOSIS — D693 Immune thrombocytopenic purpura: Secondary | ICD-10-CM | POA: Diagnosis not present

## 2021-04-28 DIAGNOSIS — D61818 Other pancytopenia: Secondary | ICD-10-CM | POA: Diagnosis not present

## 2021-04-28 DIAGNOSIS — M419 Scoliosis, unspecified: Secondary | ICD-10-CM | POA: Diagnosis not present

## 2021-04-28 MED ORDER — SODIUM CHLORIDE 0.9 % IV SOLN
Freq: Once | INTRAVENOUS | Status: AC
  Filled 2021-04-28: qty 250

## 2021-04-28 MED ORDER — DIPHENHYDRAMINE HCL 25 MG PO CAPS
50.0000 mg | ORAL_CAPSULE | Freq: Once | ORAL | Status: AC
  Administered 2021-04-28: 50 mg via ORAL

## 2021-04-28 MED ORDER — ACETAMINOPHEN 325 MG PO TABS
650.0000 mg | ORAL_TABLET | Freq: Once | ORAL | Status: AC
  Administered 2021-04-28: 650 mg via ORAL

## 2021-04-28 MED ORDER — RITUXIMAB-PVVR CHEMO 500 MG/50ML IV SOLN
375.0000 mg/m2 | Freq: Once | INTRAVENOUS | Status: AC
  Administered 2021-04-28: 600 mg via INTRAVENOUS
  Filled 2021-04-28: qty 10

## 2021-04-28 MED ORDER — DIPHENHYDRAMINE HCL 25 MG PO CAPS
ORAL_CAPSULE | ORAL | Status: AC
  Filled 2021-04-28: qty 1

## 2021-04-28 MED ORDER — ACETAMINOPHEN 325 MG PO TABS
ORAL_TABLET | ORAL | Status: AC
  Filled 2021-04-28: qty 2

## 2021-04-28 NOTE — Progress Notes (Signed)
Verbal consent to proceed with rituximab infusion was given by patient's caregivier, Heather [name]. Consent was witnessed by Heather Emmer., RN. Heather [name] states that she will sign the consent form in person after patient's infusion. Ms. Heather [name] verbalized understanding and stated that Dr. Alvy Bimler explained the risks and benefits of treatment.

## 2021-04-28 NOTE — Patient Instructions (Signed)
[name]COL[facility]  Discharge Instructions: Thank you for choosing [facility] to provide your oncology and hematology care.   If you have a lab appointment with the [facility], please go directly to the [facility] and check in at the registration area.   Wear comfortable clothing and clothing appropriate for easy access to any Portacath or PICC line.   We strive to give you quality time with your provider. You may need to reschedule your appointment if you arrive late (15 or more minutes).  Arriving late affects you and other patients whose appointments are after yours.  Also, if you miss three or more appointments without notifying the office, you may be dismissed from the clinic at the provider's discretion.      For prescription refill requests, have your pharmacy contact our office and allow 72 hours for refills to be completed.    Today you received the following chemotherapy and/or immunotherapy agents: Ruxience   To help prevent nausea and vomiting after your treatment, we encourage you to take your nausea medication as directed.  BELOW ARE SYMPTOMS THAT SHOULD BE REPORTED IMMEDIATELY: . *FEVER GREATER THAN 100.4 F (38 C) OR HIGHER . *CHILLS OR SWEATING . *NAUSEA AND VOMITING THAT IS NOT CONTROLLED WITH YOUR NAUSEA MEDICATION . *UNUSUAL SHORTNESS OF BREATH . *UNUSUAL BRUISING OR BLEEDING . *URINARY PROBLEMS (pain or burning when urinating, or frequent urination) . *BOWEL PROBLEMS (unusual diarrhea, constipation, pain near the anus) . TENDERNESS IN MOUTH AND THROAT WITH OR WITHOUT PRESENCE OF ULCERS (sore throat, sores in mouth, or a toothache) . UNUSUAL RASH, SWELLING OR PAIN  . UNUSUAL VAGINAL DISCHARGE OR ITCHING   Items with * indicate a potential emergency and should be followed up as soon as possible or go to the Emergency Department if any problems should occur.  Please show the CHEMOTHERAPY ALERT CARD or IMMUNOTHERAPY ALERT CARD  at check-in to the Emergency Department and triage nurse.  Should you have questions after your visit or need to cancel or reschedule your appointment, please contact [facility]  Dept: [phone]  and follow the prompts.  Office hours are 8:00 a.m. to 4:30 p.m. Monday - Friday. Please note that voicemails left after 4:00 p.m. may not be returned until the following business day.  We are closed weekends and major holidays. You have access to a nurse at all times for urgent questions. Please call the main number to the clinic Dept: [phone] and follow the prompts.   For any non-urgent questions, you may also contact your provider using MyChart. We now offer e-Visits for anyone 36 and older to request care online for non-urgent symptoms. For details visit [email or web address].   Also download the MyChart app! Go to the app store, search "MyChart", open the app, select [facility], and log in with your MyChart username and password.  Due to Covid, a mask is required upon entering the hospital/clinic. If you do not have a mask, one will be given to you upon arrival. For doctor visits, patients may have 1 support person aged 61 or older with them. For treatment visits, patients cannot have anyone with them due to current Covid guidelines and our immunocompromised population.   Rituximab Injection What is this medicine? RITUXIMAB (ri TUX i mab) is a monoclonal antibody. It is used to treat certain types of cancer like non-Hodgkin lymphoma and chronic lymphocytic leukemia. It is also used to treat rheumatoid arthritis, granulomatosis with polyangiitis,  microscopic polyangiitis, and pemphigus vulgaris. This medicine may be used for other purposes; ask your health care provider or pharmacist if you have questions. COMMON BRAND NAME(S): RIABNI, Rituxan, RUXIENCE What should I tell my health care provider before I take this medicine? They need to know if you have any of  these conditions:  chest pain  heart disease  infection especially a viral infection such as chickenpox, cold sores, hepatitis B, or herpes  immune system problems  irregular heartbeat or rhythm  kidney disease  low blood counts (white cells, platelets, or red cells)  lung disease  recent or upcoming vaccine  an unusual or allergic reaction to rituximab, other medicines, foods, dyes, or preservatives  pregnant or trying to get pregnant  breast-feeding How should I use this medicine? This medicine is injected into a vein. It is given by a health care provider in a hospital or clinic setting. A special MedGuide will be given to you before each treatment. Be sure to read this information carefully each time. Talk to your health care provider about the use of this medicine in children. While this drug may be prescribed for children as young as 2 years for selected conditions, precautions do apply. Overdosage: If you think you have taken too much of this medicine contact a poison control center or emergency room at once. NOTE: This medicine is only for you. Do not share this medicine with others. What if I miss a dose? Keep appointments for follow-up doses. It is important not to miss your dose. Call your health care provider if you are unable to keep an appointment. What may interact with this medicine? Do not take this medicine with any of the following medicines:  live vaccines This medicine may also interact with the following medicines:  cisplatin This list may not describe all possible interactions. Give your health care provider a list of all the medicines, herbs, non-prescription drugs, or dietary supplements you use. Also tell them if you smoke, drink alcohol, or use illegal drugs. Some items may interact with your medicine. What should I watch for while using this medicine? Your condition will be monitored carefully while you are receiving this medicine. You may need  blood work done while you are taking this medicine. This medicine can cause serious infusion reactions. To reduce the risk your health care provider may give you other medicines to take before receiving this one. Be sure to follow the directions from your health care provider. This medicine may increase your risk of getting an infection. Call your health care provider for advice if you get a fever, chills, sore throat, or other symptoms of a cold or flu. Do not treat yourself. Try to avoid being around people who are sick. Call your health care provider if you are around anyone with measles, chickenpox, or if you develop sores or blisters that do not heal properly. Avoid taking medicines that contain aspirin, acetaminophen, ibuprofen, naproxen, or ketoprofen unless instructed by your health care provider. These medicines may hide a fever. This medicine may cause serious skin reactions. They can happen weeks to months after starting the medicine. Contact your health care provider right away if you notice fevers or flu-like symptoms with a rash. The rash may be red or purple and then turn into blisters or peeling of the skin. Or, you might notice a red rash with swelling of the face, lips or lymph nodes in your neck or under your arms. In some patients, this medicine may  cause a serious brain infection that may cause death. If you have any problems seeing, thinking, speaking, walking, or standing, tell your healthcare professional right away. If you cannot reach your healthcare professional, urgently seek other source of medical care. Do not become pregnant while taking this medicine or for at least 12 months after stopping it. Women should inform their health care provider if they wish to become pregnant or think they might be pregnant. There is potential for serious harm to an unborn child. Talk to your health care provider for more information. Women should use a reliable form of birth control while taking  this medicine and for 12 months after stopping it. Do not breast-feed while taking this medicine or for at least 6 months after stopping it. What side effects may I notice from receiving this medicine? Side effects that you should report to your health care provider as soon as possible:  allergic reactions (skin rash, itching or hives; swelling of the face, lips, or tongue)  diarrhea  edema (sudden weight gain; swelling of the ankles, feet, hands or other unusual swelling; trouble breathing)  fast, irregular heartbeat  heart attack (trouble breathing; pain or tightness in the chest, neck, back or arms; unusually weak or tired)  infection (fever, chills, cough, sore throat, pain or trouble passing urine)  kidney injury (trouble passing urine or change in the amount of urine)  liver injury (dark yellow or brown urine; general ill feeling or flu-like symptoms; loss of appetite, right upper belly pain; unusually weak or tired, yellowing of the eyes or skin)  low blood pressure (dizziness; feeling faint or lightheaded, falls; unusually weak or tired)  low red blood cell counts (trouble breathing; feeling faint; lightheaded, falls; unusually weak or tired)  mouth sores  redness, blistering, peeling, or loosening of the skin, including inside the mouth  stomach pain  unusual bruising or bleeding  wheezing (trouble breathing with loud or whistling sounds)  vomiting Side effects that usually do not require medical attention (report to your health care provider if they continue or are bothersome):  headache  joint pain  muscle cramps, pain  nausea This list may not describe all possible side effects. Call your doctor for medical advice about side effects. You may report side effects to FDA at 1-800-FDA-1088. Where should I keep my medicine? This medicine is given in a hospital or clinic. It will not be stored at home. NOTE: This sheet is a summary. It may not cover all possible  information. If you have questions about this medicine, talk to your doctor, pharmacist, or health care provider.  2021 Elsevier/Gold Standard (2020-09-23 21:35:50)

## 2021-05-05 ENCOUNTER — Inpatient Hospital Stay: Payer: Medicare Other

## 2021-05-05 ENCOUNTER — Other Ambulatory Visit: Payer: Self-pay

## 2021-05-05 ENCOUNTER — Encounter: Payer: Self-pay | Admitting: Hematology and Oncology

## 2021-05-05 ENCOUNTER — Ambulatory Visit: Payer: Medicare Other

## 2021-05-05 ENCOUNTER — Inpatient Hospital Stay (HOSPITAL_BASED_OUTPATIENT_CLINIC_OR_DEPARTMENT_OTHER): Payer: Medicare Other | Admitting: Hematology and Oncology

## 2021-05-05 VITALS — BP 102/62 | HR 80 | Temp 98.2°F | Resp 18

## 2021-05-05 DIAGNOSIS — D693 Immune thrombocytopenic purpura: Secondary | ICD-10-CM

## 2021-05-05 DIAGNOSIS — M21961 Unspecified acquired deformity of right lower leg: Secondary | ICD-10-CM | POA: Diagnosis not present

## 2021-05-05 DIAGNOSIS — M419 Scoliosis, unspecified: Secondary | ICD-10-CM | POA: Diagnosis not present

## 2021-05-05 DIAGNOSIS — E538 Deficiency of other specified B group vitamins: Secondary | ICD-10-CM | POA: Diagnosis not present

## 2021-05-05 DIAGNOSIS — Z5112 Encounter for antineoplastic immunotherapy: Secondary | ICD-10-CM | POA: Diagnosis not present

## 2021-05-05 DIAGNOSIS — D61818 Other pancytopenia: Secondary | ICD-10-CM | POA: Diagnosis not present

## 2021-05-05 DIAGNOSIS — D72819 Decreased white blood cell count, unspecified: Secondary | ICD-10-CM

## 2021-05-05 LAB — CBC WITH DIFFERENTIAL/PLATELET
Abs Immature Granulocytes: 0 10*3/uL (ref 0.00–0.07)
Basophils Absolute: 0 10*3/uL (ref 0.0–0.1)
Basophils Relative: 0 %
Eosinophils Absolute: 0 10*3/uL (ref 0.0–0.5)
Eosinophils Relative: 0 %
HCT: 38.7 % (ref 36.0–46.0)
Hemoglobin: 12.4 g/dL (ref 12.0–15.0)
Immature Granulocytes: 0 %
Lymphocytes Relative: 34 %
Lymphs Abs: 1.1 10*3/uL (ref 0.7–4.0)
MCH: 32 pg (ref 26.0–34.0)
MCHC: 32 g/dL (ref 30.0–36.0)
MCV: 100 fL (ref 80.0–100.0)
Monocytes Absolute: 0.2 10*3/uL (ref 0.1–1.0)
Monocytes Relative: 5 %
Neutro Abs: 1.9 10*3/uL (ref 1.7–7.7)
Neutrophils Relative %: 61 %
Platelets: 147 10*3/uL — ABNORMAL LOW (ref 150–400)
RBC: 3.87 MIL/uL (ref 3.87–5.11)
RDW: 12.4 % (ref 11.5–15.5)
WBC: 3.2 10*3/uL — ABNORMAL LOW (ref 4.0–10.5)
nRBC: 0 % (ref 0.0–0.2)

## 2021-05-05 LAB — COMPREHENSIVE METABOLIC PANEL
ALT: <6 U/L (ref 0–44)
AST: 17 U/L (ref 15–41)
Albumin: 3.7 g/dL (ref 3.5–5.0)
Alkaline Phosphatase: 87 U/L (ref 38–126)
Anion gap: 9 (ref 5–15)
BUN: 25 mg/dL — ABNORMAL HIGH (ref 8–23)
CO2: 28 mmol/L (ref 22–32)
Calcium: 9.7 mg/dL (ref 8.9–10.3)
Chloride: 107 mmol/L (ref 98–111)
Creatinine, Ser: 0.84 mg/dL (ref 0.44–1.00)
GFR, Estimated: >60 mL/min (ref >60)
Glucose, Bld: 73 mg/dL (ref 70–99)
Potassium: 4.2 mmol/L (ref 3.5–5.1)
Sodium: 144 mmol/L (ref 135–145)
Total Bilirubin: 0.4 mg/dL (ref 0.3–1.2)
Total Protein: 6.5 g/dL (ref 6.5–8.1)

## 2021-05-05 MED ORDER — ACETAMINOPHEN 325 MG PO TABS
650.0000 mg | ORAL_TABLET | Freq: Once | ORAL | Status: AC
  Administered 2021-05-05: 650 mg via ORAL

## 2021-05-05 MED ORDER — SODIUM CHLORIDE 0.9 % IV SOLN
375.0000 mg/m2 | Freq: Once | INTRAVENOUS | Status: AC
  Administered 2021-05-05: 600 mg via INTRAVENOUS
  Filled 2021-05-05: qty 10

## 2021-05-05 MED ORDER — DIPHENHYDRAMINE HCL 25 MG PO CAPS
ORAL_CAPSULE | ORAL | Status: AC
  Filled 2021-05-05: qty 1

## 2021-05-05 MED ORDER — SODIUM CHLORIDE 0.9 % IV SOLN
Freq: Once | INTRAVENOUS | Status: AC
Start: 2021-05-05 — End: 2021-05-05
  Filled 2021-05-05: qty 250

## 2021-05-05 MED ORDER — ACETAMINOPHEN 325 MG PO TABS
ORAL_TABLET | ORAL | Status: AC
  Filled 2021-05-05: qty 2

## 2021-05-05 MED ORDER — DIPHENHYDRAMINE HCL 25 MG PO CAPS
25.0000 mg | ORAL_CAPSULE | Freq: Once | ORAL | Status: AC
Start: 2021-05-05 — End: 2021-05-05
  Administered 2021-05-05: 25 mg via ORAL

## 2021-05-05 NOTE — Assessment & Plan Note (Signed)
She is not symptomatic. Observe for now 

## 2021-05-05 NOTE — Assessment & Plan Note (Addendum)
She has amazing response with only 1 dose of rituximab She will continue treatment as scheduled So far, she has no infusion reactions If her platelet count remains good next week, we will discontinue prednisone altogether

## 2021-05-05 NOTE — Progress Notes (Signed)
Rapid Infusion Rituximab Pharmacist Evaluation  MANUELITA MOXON is a 60 y.o. female being treated with rituximab for ITP. This patient may be considered for RIR.   A pharmacist has verified the patient tolerated rituximab infusions per the [facility] standard infusion protocol without grade 3-4 infusion reactions. The treatment plan will be updated to reflect RIR if the patient qualifies per the checklist below:   Age > 46 years old Yes   Clinically significant cardiovascular disease No   Circulating lymphocyte count < 5000/uL prior to cycle two Yes  Lab Results  Component Value Date   LYMPHSABS 1.1 05/05/2021    Prior documented grade 3-4 infusion reaction to rituximab No   Prior documented grade 1-2 infusion reaction to rituximab (If YES, Pharmacist will confirm with Physician if patient is still a candidate for RIR) No   Previous rituximab infusion within the past 6 months Yes   Treatment Plan updated orders to reflect [facility] does meet the criteria for Rapid Infusion Rituximab. This patient is going to be switched to rapid infusion rituximab.    Kennith Center, Pharm.D., CPP 05/05/2021@11 :25 AM

## 2021-05-05 NOTE — Patient Instructions (Signed)
[facility]  Discharge Instructions: Thank you for choosing [facility] to provide your oncology and hematology care.   If you have a lab appointment with the [facility], please go directly to the [facility] and check in at the registration area.   Wear comfortable clothing and clothing appropriate for easy access to any Portacath or PICC line.   We strive to give you quality time with your provider. You may need to reschedule your appointment if you arrive late (15 or more minutes).  Arriving late affects you and other patients whose appointments are after yours.  Also, if you miss three or more appointments without notifying the office, you may be dismissed from the clinic at the provider's discretion.      For prescription refill requests, have your pharmacy contact our office and allow 72 hours for refills to be completed.    Today you received the following chemotherapy and/or immunotherapy agents rituxumab    To help prevent nausea and vomiting after your treatment, we encourage you to take your nausea medication as directed.  BELOW ARE SYMPTOMS THAT SHOULD BE REPORTED IMMEDIATELY: . *FEVER GREATER THAN 100.4 F (38 C) OR HIGHER . *CHILLS OR SWEATING . *NAUSEA AND VOMITING THAT IS NOT CONTROLLED WITH YOUR NAUSEA MEDICATION . *UNUSUAL SHORTNESS OF BREATH . *UNUSUAL BRUISING OR BLEEDING . *URINARY PROBLEMS (pain or burning when urinating, or frequent urination) . *BOWEL PROBLEMS (unusual diarrhea, constipation, pain near the anus) . TENDERNESS IN MOUTH AND THROAT WITH OR WITHOUT PRESENCE OF ULCERS (sore throat, sores in mouth, or a toothache) . UNUSUAL RASH, SWELLING OR PAIN  . UNUSUAL VAGINAL DISCHARGE OR ITCHING   Items with * indicate a potential emergency and should be followed up as soon as possible or go to the Emergency Department if any problems should occur.  Please show the CHEMOTHERAPY ALERT CARD or IMMUNOTHERAPY ALERT  CARD at check-in to the Emergency Department and triage nurse.  Should you have questions after your visit or need to cancel or reschedule your appointment, please contact [facility]  Dept: [phone]  and follow the prompts.  Office hours are 8:00 a.m. to 4:30 p.m. Monday - Friday. Please note that voicemails left after 4:00 p.m. may not be returned until the following business day.  We are closed weekends and major holidays. You have access to a nurse at all times for urgent questions. Please call the main number to the clinic Dept: [phone] and follow the prompts.   For any non-urgent questions, you may also contact your provider using MyChart. We now offer e-Visits for anyone 1 and older to request care online for non-urgent symptoms. For details visit [email or web address].   Also download the MyChart app! Go to the app store, search "MyChart", open the app, select [facility], and log in with your MyChart username and password.  Due to Covid, a mask is required upon entering the hospital/clinic. If you do not have a mask, one will be given to you upon arrival. For doctor visits, patients may have 1 support person aged 59 or older with them. For treatment visits, patients cannot have anyone with them due to current Covid guidelines and our immunocompromised population.

## 2021-05-05 NOTE — Progress Notes (Signed)
[facility] OFFICE PROGRESS NOTE  He[name], [place]  ASSESSMENT & PLAN:  Chronic ITP (idiopathic thrombocytopenia) (HCC) She has amazing response with only 1 dose of rituximab She will continue treatment as scheduled So far, she has no infusion reactions If her platelet count remains good next week, we will discontinue prednisone altogether  Chronic leukopenia She is not symptomatic Observe for now   No orders of the defined types were placed in this encounter.   The total time spent in the appointment was 20 minutes encounter with patients including review of chart and various tests results, discussions about plan of care and coordination of care plan   All questions were answered. The patient knows to call the clinic with any problems, questions or concerns. No barriers to learning was detected.    Heather Lark, MD 5/12/202211:07 AM  INTERVAL HISTORY: Heather [name] 60 y.o. female returns for second dose of rituximab She tolerated cycle 1 of treatment well except for minor skin bruising from venous access She has no infusion reactions The patient denies any recent signs or symptoms of bleeding such as spontaneous epistaxis, hematuria or hematochezia.  SUMMARY OF HEMATOLOGIC HISTORY:  Heather [name] is here because of thrombocytopenia. She has mild mental deficit.  Her background history is not clear. She lives with Heather [name] for 4 years.  She was found to have abnormal CBC from recent blood work. She has normal CBC as of last year. Most recently, repeat CBC show mild leukopenia with a platelet count of 89,000.  That was subsequently repeated and it got even lower to 69,000. She had discovered recent bruising but denies spontaneous epistaxis, hematuria, melena or hematochezia The patient denies history of liver disease, exposure to heparin, history of cardiac murmur/prior cardiovascular surgery or recent new medications She had received blood  transfusion in the past. Although the patient denies a history of liver disease, ultrasound of the abdomen from April 2012 revealed fatty infiltration of the liver, suspicious for possible fatty liver disease versus other liver condition. She denies recent new medications.  She denies recent infection. Further workup revealed evidence of liver lesion on CT imaging and positive ANA screen. She was being observed from the hematology standpoint to progression of pancytopenia On 09/09/2018, she was started on 40 mg of prednisone daily On October 1st, 2019, the dose of prednisone is reduced to 20 mg daily On October 01, 2018, dose of prednisone is reduced to 10 mg daily On October 08, 2018, the dose of prednisone is reduced to 5 mg Bone density scan in November 2019 showed osteopenia with T score of -2.4 On March 06, 2019, the dose of prednisone is reduced to 2.5 mg daily On March 24, 2019, the dose of prednisone is adjusted to 2.5 mg alternate with 5 mg every other day Starting in February, dose of prednisone is reduced to 2.5 mg daily Starting 03/18/2020, prednisone dose is reduced to every other day at 2.5 mg and then twice a week.  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  ALLERGIES:  has No Known Allergies.  MEDICATIONS:  Current Outpatient Medications  Medication Sig Dispense Refill  .  cholecalciferol (VITAMIN D3) 25 MCG (1000 UNIT) tablet Take 2,000 Units by mouth daily.    Marland Kitchen desvenlafaxine (PRISTIQ) 50 MG 24 hr tablet Take 1 tablet (50 mg total) by mouth daily. 30 tablet 0  . gabapentin (NEURONTIN) 300 MG capsule Take  300 mg by mouth at bedtime.    . meloxicam (MOBIC) 7.5 MG tablet Take 15 mg by mouth daily.    Marland Kitchen MYRBETRIQ 25 MG TB24 tablet Take 25 mg by mouth daily.    . predniSONE (DELTASONE) 2.5 MG tablet Take 2.5 mg twice a week by mouth 30 tablet 9  . traMADol (ULTRAM) 50 MG tablet Take 1 tablet (50 mg total) by mouth every 6 (six) hours as needed. 15 tablet 0  . triamcinolone cream (KENALOG) 0.1 % Apply thin layer to affected area TID PRN itching 30 g 0  . vitamin B-12 (CYANOCOBALAMIN) 1000 MCG tablet Take 1,000 mcg by mouth daily.     No current facility-administered medications for this visit.     REVIEW OF SYSTEMS:   Constitutional: Denies fevers, chills or night sweats Eyes: Denies blurriness of vision Ears, nose, mouth, throat, and face: Denies mucositis or sore throat Respiratory: Denies cough, dyspnea or wheezes Cardiovascular: Denies palpitation, chest discomfort or lower extremity swelling Gastrointestinal:  Denies nausea, heartburn or change in bowel habits Skin: Denies abnormal skin rashes Lymphatics: Denies new lymphadenopathy or easy bruising Neurological:Denies numbness, tingling or new weaknesses Behavioral/Psych: Mood is stable, no new changes  All other systems were reviewed with the patient and are negative.  PHYSICAL EXAMINATION: ECOG PERFORMANCE STATUS: 1 - Symptomatic but completely ambulatory  Vitals:   05/05/21 1057  BP: (!) 105/49  Pulse: 77  Resp: 18  Temp: (!) 97.5 F (36.4 C)  SpO2: 97%   Filed Weights   05/05/21 1057  Weight: 121 lb 9.6 oz (55.2 kg)    GENERAL:alert, no distress and comfortable SKIN: Noted skin bruises NEURO: alert & oriented x 3 with fluent speech, no focal motor/sensory deficits  LABORATORY DATA:  I have reviewed the data as listed     Component Value Date/Time   NA 145 04/25/2021 1043   NA 143 01/03/2018 1108   K 4.3 04/25/2021 1043   CL 106 04/25/2021 1043   CO2 29 04/25/2021 1043   GLUCOSE 83 04/25/2021 1043   BUN 26  (H) 04/25/2021 1043   BUN 19 01/03/2018 1108   CREATININE 0.92 04/25/2021 1043   CREATININE 1.01 (H) 09/09/2018 0913   CREATININE 0.85 08/22/2014 1031   CALCIUM 9.4 04/25/2021 1043   PROT 6.4 (L) 04/25/2021 1043   PROT 6.2 01/03/2018 1108   ALBUMIN 3.6 04/25/2021 1043   ALBUMIN 3.9 01/03/2018 1108   AST 18 04/25/2021 1043   AST 15 04/18/2018 1110   ALT <6 04/25/2021 1043   ALT <6 04/18/2018 1110   ALKPHOS 87 04/25/2021 1043   BILITOT 0.3 04/25/2021 1043   BILITOT 0.3 04/18/2018 1110   GFRNONAA >60 04/25/2021 1043   GFRNONAA 60 (L) 09/09/2018 0913   GFRAA >60 10/15/2018 0823   GFRAA >60 09/09/2018 0913    No results found for: SPEP, UPEP  Lab Results  Component Value Date   WBC 3.2 (L) 05/05/2021   NEUTROABS 1.9 05/05/2021   HGB 12.4 05/05/2021   HCT 38.7 05/05/2021   MCV 100.0 05/05/2021   PLT 147 (L) 05/05/2021      Chemistry      Component Value Date/Time   NA 145 04/25/2021 1043   NA 143  01/03/2018 1108   K 4.3 04/25/2021 1043   CL 106 04/25/2021 1043   CO2 29 04/25/2021 1043   BUN 26 (H) 04/25/2021 1043   BUN 19 01/03/2018 1108   CREATININE 0.92 04/25/2021 1043   CREATININE 1.01 (H) 09/09/2018 0913   CREATININE 0.85 08/22/2014 1031      Component Value Date/Time   CALCIUM 9.4 04/25/2021 1043   ALKPHOS 87 04/25/2021 1043   AST 18 04/25/2021 1043   AST 15 04/18/2018 1110   ALT <6 04/25/2021 1043   ALT <6 04/18/2018 1110   BILITOT 0.3 04/25/2021 1043   BILITOT 0.3 04/18/2018 1110

## 2021-05-12 ENCOUNTER — Ambulatory Visit: Payer: Medicare Other

## 2021-05-12 ENCOUNTER — Telehealth: Payer: Self-pay

## 2021-05-12 ENCOUNTER — Inpatient Hospital Stay: Payer: Medicare Other

## 2021-05-12 ENCOUNTER — Other Ambulatory Visit: Payer: Self-pay

## 2021-05-12 VITALS — BP 118/82 | HR 70 | Temp 98.0°F | Resp 16 | Wt 122.5 lb

## 2021-05-12 DIAGNOSIS — D61818 Other pancytopenia: Secondary | ICD-10-CM | POA: Diagnosis not present

## 2021-05-12 DIAGNOSIS — Z5112 Encounter for antineoplastic immunotherapy: Secondary | ICD-10-CM | POA: Diagnosis not present

## 2021-05-12 DIAGNOSIS — E538 Deficiency of other specified B group vitamins: Secondary | ICD-10-CM | POA: Diagnosis not present

## 2021-05-12 DIAGNOSIS — D693 Immune thrombocytopenic purpura: Secondary | ICD-10-CM

## 2021-05-12 DIAGNOSIS — M419 Scoliosis, unspecified: Secondary | ICD-10-CM | POA: Diagnosis not present

## 2021-05-12 DIAGNOSIS — M21961 Unspecified acquired deformity of right lower leg: Secondary | ICD-10-CM | POA: Diagnosis not present

## 2021-05-12 LAB — COMPREHENSIVE METABOLIC PANEL
ALT: <6 U/L (ref 0–44)
AST: 19 U/L (ref 15–41)
Albumin: 3.6 g/dL (ref 3.5–5.0)
Alkaline Phosphatase: 82 U/L (ref 38–126)
Anion gap: 10 (ref 5–15)
BUN: 22 mg/dL (ref 8–23)
CO2: 26 mmol/L (ref 22–32)
Calcium: 9.4 mg/dL (ref 8.9–10.3)
Chloride: 109 mmol/L (ref 98–111)
Creatinine, Ser: 0.86 mg/dL (ref 0.44–1.00)
GFR, Estimated: >60 mL/min (ref >60)
Glucose, Bld: 82 mg/dL (ref 70–99)
Potassium: 4.2 mmol/L (ref 3.5–5.1)
Sodium: 145 mmol/L (ref 135–145)
Total Bilirubin: 0.3 mg/dL (ref 0.3–1.2)
Total Protein: 6.3 g/dL — ABNORMAL LOW (ref 6.5–8.1)

## 2021-05-12 LAB — CBC WITH DIFFERENTIAL/PLATELET
Abs Immature Granulocytes: 0.01 10*3/uL (ref 0.00–0.07)
Basophils Absolute: 0 10*3/uL (ref 0.0–0.1)
Basophils Relative: 1 %
Eosinophils Absolute: 0 10*3/uL (ref 0.0–0.5)
Eosinophils Relative: 0 %
HCT: 36.8 % (ref 36.0–46.0)
Hemoglobin: 11.9 g/dL — ABNORMAL LOW (ref 12.0–15.0)
Immature Granulocytes: 0 %
Lymphocytes Relative: 32 %
Lymphs Abs: 1.2 10*3/uL (ref 0.7–4.0)
MCH: 32.2 pg (ref 26.0–34.0)
MCHC: 32.3 g/dL (ref 30.0–36.0)
MCV: 99.7 fL (ref 80.0–100.0)
Monocytes Absolute: 0.2 10*3/uL (ref 0.1–1.0)
Monocytes Relative: 6 %
Neutro Abs: 2.3 10*3/uL (ref 1.7–7.7)
Neutrophils Relative %: 61 %
Platelets: 106 10*3/uL — ABNORMAL LOW (ref 150–400)
RBC: 3.69 MIL/uL — ABNORMAL LOW (ref 3.87–5.11)
RDW: 12.7 % (ref 11.5–15.5)
WBC: 3.7 10*3/uL — ABNORMAL LOW (ref 4.0–10.5)
nRBC: 0 % (ref 0.0–0.2)

## 2021-05-12 MED ORDER — DIPHENHYDRAMINE HCL 25 MG PO CAPS
ORAL_CAPSULE | ORAL | Status: AC
  Filled 2021-05-12: qty 1

## 2021-05-12 MED ORDER — SODIUM CHLORIDE 0.9 % IV SOLN
375.0000 mg/m2 | Freq: Once | INTRAVENOUS | Status: AC
  Administered 2021-05-12: 600 mg via INTRAVENOUS
  Filled 2021-05-12: qty 50

## 2021-05-12 MED ORDER — ACETAMINOPHEN 325 MG PO TABS
650.0000 mg | ORAL_TABLET | Freq: Once | ORAL | Status: AC
  Administered 2021-05-12: 650 mg via ORAL

## 2021-05-12 MED ORDER — SODIUM CHLORIDE 0.9 % IV SOLN
Freq: Once | INTRAVENOUS | Status: AC
  Filled 2021-05-12: qty 250

## 2021-05-12 MED ORDER — ACETAMINOPHEN 325 MG PO TABS
ORAL_TABLET | ORAL | Status: AC
  Filled 2021-05-12: qty 2

## 2021-05-12 MED ORDER — DIPHENHYDRAMINE HCL 25 MG PO CAPS
25.0000 mg | ORAL_CAPSULE | Freq: Once | ORAL | Status: AC
  Administered 2021-05-12: 25 mg via ORAL

## 2021-05-12 NOTE — Patient Instructions (Addendum)
[facility]  Discharge Instructions: ?Thank you for choosing [facility] to provide your oncology and hematology care.  ? ?If you have a lab appointment with the Cancer Center, please go directly to the Cancer Center and check in at the registration area. ?  ?Wear comfortable clothing and clothing appropriate for easy access to any Portacath or PICC line.  ? ?We strive to give you quality time with your provider. You may need to reschedule your appointment if you arrive late (15 or more minutes).  Arriving late affects you and other patients whose appointments are after yours.  Also, if you miss three or more appointments without notifying the office, you may be dismissed from the clinic at the provider?s discretion.    ?  ?For prescription refill requests, have your pharmacy contact our office and allow 72 hours for refills to be completed.   ? ?Today you received the following chemotherapy and/or immunotherapy agent: Rituximab ?  ?To help prevent nausea and vomiting after your treatment, we encourage you to take your nausea medication as directed. ? ?BELOW ARE SYMPTOMS THAT SHOULD BE REPORTED IMMEDIATELY: ?*FEVER GREATER THAN 100.4 F (38 ?C) OR HIGHER ?*CHILLS OR SWEATING ?*NAUSEA AND VOMITING THAT IS NOT CONTROLLED WITH YOUR NAUSEA MEDICATION ?*UNUSUAL SHORTNESS OF BREATH ?*UNUSUAL BRUISING OR BLEEDING ?*URINARY PROBLEMS (pain or burning when urinating, or frequent urination) ?*BOWEL PROBLEMS (unusual diarrhea, constipation, pain near the anus) ?TENDERNESS IN MOUTH AND THROAT WITH OR WITHOUT PRESENCE OF ULCERS (sore throat, sores in mouth, or a toothache) ?UNUSUAL RASH, SWELLING OR PAIN  ?UNUSUAL VAGINAL DISCHARGE OR ITCHING  ? ?Items with * indicate a potential emergency and should be followed up as soon as possible or go to the Emergency Department if any problems should occur. ? ?Please show the CHEMOTHERAPY ALERT CARD or IMMUNOTHERAPY ALERT CARD at check-in to the  Emergency Department and triage nurse. ? ?Should you have questions after your visit or need to cancel or reschedule your appointment, please contact [facility]  Dept: [phone]  and follow the prompts.  Office hours are 8:00 a.m. to 4:30 p.m. Monday - Friday. Please note that voicemails left after 4:00 p.m. may not be returned until the following business day.  We are closed weekends and major holidays. You have access to a nurse at all times for urgent questions. Please call the main number to the clinic Dept: [phone] and follow the prompts. ? ? ?For any non-urgent questions, you may also contact your provider using MyChart. We now offer e-Visits for anyone 18 and older to request care online for non-urgent symptoms. For details visit [email or web address]. ?  ?Also download the MyChart app! Go to the app store, search "MyChart", open the app, select [facility], and log in with your MyChart username and password. ? ?Due to Covid, a mask is required upon entering the hospital/clinic. If you do not have a mask, one will be given to you upon arrival. For doctor visits, patients may have 1 support person aged 18 or older with them. For treatment visits, patients cannot have anyone with them due to current Covid guidelines and our immunocompromised population.  ? ?

## 2021-05-12 NOTE — Telephone Encounter (Signed)
Called [facility] and told her, [name] should continue with the same does of Prednisone Rx. She verbalized understanding.

## 2021-05-19 ENCOUNTER — Other Ambulatory Visit: Payer: Self-pay

## 2021-05-19 ENCOUNTER — Inpatient Hospital Stay (HOSPITAL_BASED_OUTPATIENT_CLINIC_OR_DEPARTMENT_OTHER): Payer: Medicare Other | Admitting: Hematology and Oncology

## 2021-05-19 ENCOUNTER — Inpatient Hospital Stay: Payer: Medicare Other

## 2021-05-19 ENCOUNTER — Telehealth: Payer: Self-pay | Admitting: Hematology and Oncology

## 2021-05-19 ENCOUNTER — Ambulatory Visit: Payer: Medicare Other

## 2021-05-19 ENCOUNTER — Encounter: Payer: Self-pay | Admitting: Hematology and Oncology

## 2021-05-19 VITALS — BP 113/63 | HR 65 | Temp 98.4°F | Resp 16

## 2021-05-19 DIAGNOSIS — D693 Immune thrombocytopenic purpura: Secondary | ICD-10-CM

## 2021-05-19 DIAGNOSIS — D61818 Other pancytopenia: Secondary | ICD-10-CM

## 2021-05-19 DIAGNOSIS — E538 Deficiency of other specified B group vitamins: Secondary | ICD-10-CM

## 2021-05-19 DIAGNOSIS — M419 Scoliosis, unspecified: Secondary | ICD-10-CM | POA: Diagnosis not present

## 2021-05-19 DIAGNOSIS — Z5112 Encounter for antineoplastic immunotherapy: Secondary | ICD-10-CM | POA: Diagnosis not present

## 2021-05-19 DIAGNOSIS — M21961 Unspecified acquired deformity of right lower leg: Secondary | ICD-10-CM | POA: Diagnosis not present

## 2021-05-19 LAB — CBC WITH DIFFERENTIAL/PLATELET
Abs Immature Granulocytes: 0.01 10*3/uL (ref 0.00–0.07)
Basophils Absolute: 0 10*3/uL (ref 0.0–0.1)
Basophils Relative: 1 %
Eosinophils Absolute: 0 10*3/uL (ref 0.0–0.5)
Eosinophils Relative: 0 %
HCT: 35.8 % — ABNORMAL LOW (ref 36.0–46.0)
Hemoglobin: 11.6 g/dL — ABNORMAL LOW (ref 12.0–15.0)
Immature Granulocytes: 0 %
Lymphocytes Relative: 35 %
Lymphs Abs: 1.1 10*3/uL (ref 0.7–4.0)
MCH: 32.1 pg (ref 26.0–34.0)
MCHC: 32.4 g/dL (ref 30.0–36.0)
MCV: 99.2 fL (ref 80.0–100.0)
Monocytes Absolute: 0.2 10*3/uL (ref 0.1–1.0)
Monocytes Relative: 7 %
Neutro Abs: 1.8 10*3/uL (ref 1.7–7.7)
Neutrophils Relative %: 57 %
Platelets: 86 10*3/uL — ABNORMAL LOW (ref 150–400)
RBC: 3.61 MIL/uL — ABNORMAL LOW (ref 3.87–5.11)
RDW: 12.6 % (ref 11.5–15.5)
WBC: 3.2 10*3/uL — ABNORMAL LOW (ref 4.0–10.5)
nRBC: 0 % (ref 0.0–0.2)

## 2021-05-19 LAB — COMPREHENSIVE METABOLIC PANEL
ALT: <6 U/L (ref 0–44)
AST: 17 U/L (ref 15–41)
Albumin: 3.6 g/dL (ref 3.5–5.0)
Alkaline Phosphatase: 80 U/L (ref 38–126)
Anion gap: 8 (ref 5–15)
BUN: 21 mg/dL (ref 8–23)
CO2: 29 mmol/L (ref 22–32)
Calcium: 9.6 mg/dL (ref 8.9–10.3)
Chloride: 106 mmol/L (ref 98–111)
Creatinine, Ser: 0.92 mg/dL (ref 0.44–1.00)
GFR, Estimated: >60 mL/min (ref >60)
Glucose, Bld: 75 mg/dL (ref 70–99)
Potassium: 4.3 mmol/L (ref 3.5–5.1)
Sodium: 143 mmol/L (ref 135–145)
Total Bilirubin: 0.3 mg/dL (ref 0.3–1.2)
Total Protein: 6.3 g/dL — ABNORMAL LOW (ref 6.5–8.1)

## 2021-05-19 MED ORDER — ACETAMINOPHEN 325 MG PO TABS
650.0000 mg | ORAL_TABLET | Freq: Once | ORAL | Status: AC
  Administered 2021-05-19: 650 mg via ORAL

## 2021-05-19 MED ORDER — ACETAMINOPHEN 325 MG PO TABS
ORAL_TABLET | ORAL | Status: AC
  Filled 2021-05-19: qty 2

## 2021-05-19 MED ORDER — SODIUM CHLORIDE 0.9 % IV SOLN
375.0000 mg/m2 | Freq: Once | INTRAVENOUS | Status: AC
  Administered 2021-05-19: 600 mg via INTRAVENOUS
  Filled 2021-05-19: qty 10

## 2021-05-19 MED ORDER — DIPHENHYDRAMINE HCL 25 MG PO CAPS
ORAL_CAPSULE | ORAL | Status: AC
  Filled 2021-05-19: qty 1

## 2021-05-19 MED ORDER — DIPHENHYDRAMINE HCL 25 MG PO CAPS
25.0000 mg | ORAL_CAPSULE | Freq: Once | ORAL | Status: AC
  Administered 2021-05-19: 25 mg via ORAL

## 2021-05-19 MED ORDER — SODIUM CHLORIDE 0.9 % IV SOLN
Freq: Once | INTRAVENOUS | Status: AC
Start: 2021-05-19 — End: 2021-05-19
  Filled 2021-05-19: qty 250

## 2021-05-19 NOTE — Assessment & Plan Note (Signed)
She has history of vitamin B12 deficiency Recheck vitamin B12 is adequate She does not need vitamin B-12 injection now Plan to recheck in 6 months

## 2021-05-19 NOTE — Patient Instructions (Signed)
[name]  Discharge Instructions: ?Thank you for choosing [name] to provide your oncology and hematology care.  ? ?If you have a lab appointment with the [name], please go directly to the [name] and check in at the registration area. ?  ?Wear comfortable clothing and clothing appropriate for easy access to any Portacath or PICC line.  ? ?We strive to give you quality time with your provider. You may need to reschedule your appointment if you arrive late (15 or more minutes).  Arriving late affects you and other patients whose appointments are after yours.  Also, if you miss three or more appointments without notifying the office, you may be dismissed from the clinic at the provider?s discretion.    ?  ?For prescription refill requests, have your pharmacy contact our office and allow 72 hours for refills to be completed.   ? ?Today you received the following chemotherapy and/or immunotherapy agent: Rituximab ?  ?To help prevent nausea and vomiting after your treatment, we encourage you to take your nausea medication as directed. ? ?BELOW ARE SYMPTOMS THAT SHOULD BE REPORTED IMMEDIATELY: ?*FEVER GREATER THAN 100.4 F (38 ?C) OR HIGHER ?*CHILLS OR SWEATING ?*NAUSEA AND VOMITING THAT IS NOT CONTROLLED WITH YOUR NAUSEA MEDICATION ?*UNUSUAL SHORTNESS OF BREATH ?*UNUSUAL BRUISING OR BLEEDING ?*URINARY PROBLEMS (pain or burning when urinating, or frequent urination) ?*BOWEL PROBLEMS (unusual diarrhea, constipation, pain near the anus) ?TENDERNESS IN MOUTH AND THROAT WITH OR WITHOUT PRESENCE OF ULCERS (sore throat, sores in mouth, or a toothache) ?UNUSUAL RASH, SWELLING OR PAIN  ?UNUSUAL VAGINAL DISCHARGE OR ITCHING  ? ?Items with * indicate a potential emergency and should be followed up as soon as possible or go to the Emergency Department if any problems should occur. ? ?Please show the CHEMOTHERAPY ALERT CARD or IMMUNOTHERAPY ALERT CARD at check-in to the  Emergency Department and triage nurse. ? ?Should you have questions after your visit or need to cancel or reschedule your appointment, please contact [name]  Dept: [phone]  and follow the prompts.  Office hours are 8:00 a.m. to 4:30 p.m. Monday - Friday. Please note that voicemails left after 4:00 p.m. may not be returned until the following business day.  We are closed weekends and major holidays. You have access to a nurse at all times for urgent questions. Please call the main number to the clinic Dept: [phone] and follow the prompts. ? ? ?For any non-urgent questions, you may also contact your provider using MyChart. We now offer e-Visits for anyone 18 and older to request care online for non-urgent symptoms. For details visit [email or web address]. ?  ?Also download the MyChart app! Go to the app store, search "MyChart", open the app, select [facility], and log in with your MyChart username and password. ? ?Due to Covid, a mask is required upon entering the hospital/clinic. If you do not have a mask, one will be given to you upon arrival. For doctor visits, patients may have 1 support person aged 18 or older with them. For treatment visits, patients cannot have anyone with them due to current Covid guidelines and our immunocompromised population.  ? ?

## 2021-05-19 NOTE — Telephone Encounter (Signed)
Scheduled appointment per 05/26 scheduled message. Patient is aware.

## 2021-05-19 NOTE — Progress Notes (Signed)
[facility] OFFICE PROGRESS NOTE  Heather [name], [place]  ASSESSMENT & PLAN:  Chronic ITP (idiopathic thrombocytopenia) (HCC) She will complete fourth dose of rituximab today So far, she has not responded to rituximab yet She will continue prednisone twice a week I plan to see her back in a month for further follow-up  Pancytopenia, acquired ([place]) The pancytopenia is multifactorial I believe the patient may have component of autoimmune in the background Her recent bone marrow is unremarkable for malignancy Observe for now She does not need treatment for leukopenia or anemia  Vitamin B12 deficiency She has history of vitamin B12 deficiency Recheck vitamin B12 is adequate She does not need vitamin B-12 injection now Plan to recheck in 6 months   No orders of the defined types were placed in this encounter.   The total time spent in the appointment was 20 minutes encounter with patients including review of chart and various tests results, discussions about plan of care and coordination of care plan   All questions were answered. The patient knows to call the clinic with any problems, questions or concerns. No barriers to learning was detected.    [name], MD 5/26/202211:19 AM  INTERVAL HISTORY: Heather [name] 60 y.o. female returns for further follow-up on chronic ITP on rituximab She is here with her caregiver She denies recent bleeding She tolerated treatment well without side effects or infusion reaction  SUMMARY OF HEMATOLOGIC HISTORY:  [name] is here because of thrombocytopenia. She has mild mental deficit.  Her background history is not clear. She lives with [name] for 4 years.  She was found to have abnormal CBC from recent blood work. She has normal CBC as of last year. Most recently, repeat CBC show mild leukopenia with a platelet count of 89,000.  That was subsequently repeated and it got even lower to 69,000. She had  discovered recent bruising but denies spontaneous epistaxis, hematuria, melena or hematochezia The patient denies history of liver disease, exposure to heparin, history of cardiac murmur/prior cardiovascular surgery or recent new medications She had received blood transfusion in the past. Although the patient denies a history of liver disease, ultrasound of the abdomen from April 2012 revealed fatty infiltration of the liver, suspicious for possible fatty liver disease versus other liver condition. She denies recent new medications.  She denies recent infection. Further workup revealed evidence of liver lesion on CT imaging and positive ANA screen. She was being observed from the hematology standpoint to progression of pancytopenia On 09/09/2018, she was started on 40 mg of prednisone daily On October 1st, 2019, the dose of prednisone is reduced to 20 mg daily On October 01, 2018, dose of prednisone is reduced to 10 mg daily On October 08, 2018, the dose of prednisone is reduced to 5 mg Bone density scan in November 2019 showed osteopenia with T score of -2.4 On March 06, 2019, the dose of prednisone is reduced to 2.5 mg daily On March 24, 2019, the dose of prednisone is adjusted to 2.5 mg alternate with 5 mg every other day Starting in February, dose of prednisone is reduced to 2.5 mg daily Starting 03/18/2020, prednisone dose is reduced to every other day at 2.5 mg and then twice a week.  She was found to have vitamin B12 deficiency and started taking oral vitamin B12 supplementation On 03/17/2021, she underwent bone marrow aspirate and biopsy which showed mild hypocellular bone marrow without dysplasia On 04/28/2021, she was started on  rituximab  I have reviewed the past medical history, past surgical history, social history and family history with the patient and they are unchanged from previous note.  ALLERGIES:  has No Known Allergies.  MEDICATIONS:  Current Outpatient Medications   Medication Sig Dispense Refill  . cholecalciferol (VITAMIN D3) 25 MCG (1000 UNIT) tablet Take 2,000 Units by mouth daily.    Marland Kitchen desvenlafaxine (PRISTIQ) 50 MG 24 hr tablet Take 1 tablet (50 mg total) by mouth daily. 30 tablet 0  . meloxicam (MOBIC) 7.5 MG tablet Take 15 mg by mouth daily.    Marland Kitchen MYRBETRIQ 25 MG TB24 tablet Take 25 mg by mouth daily.    . predniSONE (DELTASONE) 2.5 MG tablet Take 2.5 mg twice a week by mouth 30 tablet 9  . traMADol (ULTRAM) 50 MG tablet Take 1 tablet (50 mg total) by mouth every 6 (six) hours as needed. 15 tablet 0  . triamcinolone cream (KENALOG) 0.1 % Apply thin layer to affected area TID PRN itching 30 g 0  . vitamin B-12 (CYANOCOBALAMIN) 1000 MCG tablet Take 1,000 mcg by mouth daily.     No current facility-administered medications for this visit.     REVIEW OF SYSTEMS:   Constitutional: Denies fevers, chills or night sweats Eyes: Denies blurriness of vision Ears, nose, mouth, throat, and face: Denies mucositis or sore throat Respiratory: Denies cough, dyspnea or wheezes Cardiovascular: Denies palpitation, chest discomfort or lower extremity swelling Gastrointestinal:  Denies nausea, heartburn or change in bowel habits Skin: Denies abnormal skin rashes Lymphatics: Denies new lymphadenopathy or easy bruising Neurological:Denies numbness, tingling or new weaknesses Behavioral/Psych: Mood is stable, no new changes  All other systems were reviewed with the patient and are negative.  PHYSICAL EXAMINATION: ECOG PERFORMANCE STATUS: 0 - Asymptomatic  Vitals:   05/19/21 1104  BP: (!) 111/58  Pulse: 69  Resp: 18  Temp: (!) 97.4 F (36.3 C)  SpO2: 100%   Filed Weights   05/19/21 1104  Weight: 122 lb 6.4 oz (55.5 kg)    GENERAL:alert, no distress and comfortable NEURO: alert & oriented x 3 with fluent speech, no focal motor/sensory deficits  LABORATORY DATA:  I have reviewed the data as listed     Component Value Date/Time   NA 145 05/12/2021  0959   NA 143 01/03/2018 1108   K 4.2 05/12/2021 0959   CL 109 05/12/2021 0959   CO2 26 05/12/2021 0959   GLUCOSE 82 05/12/2021 0959   BUN 22 05/12/2021 0959   BUN 19 01/03/2018 1108   CREATININE 0.86 05/12/2021 0959   CREATININE 1.01 (H) 09/09/2018 0913   CREATININE 0.85 08/22/2014 1031   CALCIUM 9.4 05/12/2021 0959   PROT 6.3 (L) 05/12/2021 0959   PROT 6.2 01/03/2018 1108   ALBUMIN 3.6 05/12/2021 0959   ALBUMIN 3.9 01/03/2018 1108   AST 19 05/12/2021 0959   AST 15 04/18/2018 1110   ALT <6 05/12/2021 0959   ALT <6 04/18/2018 1110   ALKPHOS 82 05/12/2021 0959   BILITOT 0.3 05/12/2021 0959   BILITOT 0.3 04/18/2018 1110   GFRNONAA >60 05/12/2021 0959   GFRNONAA 60 (L) 09/09/2018 0913   GFRAA >60 10/15/2018 0823   GFRAA >60 09/09/2018 0913    No results found for: SPEP, UPEP  Lab Results  Component Value Date   WBC 3.2 (L) 05/19/2021   NEUTROABS 1.8 05/19/2021   HGB 11.6 (L) 05/19/2021   HCT 35.8 (L) 05/19/2021   MCV 99.2 05/19/2021   PLT 86 (L)  05/19/2021      Chemistry      Component Value Date/Time   NA 145 05/12/2021 0959   NA 143 01/03/2018 1108   K 4.2 05/12/2021 0959   CL 109 05/12/2021 0959   CO2 26 05/12/2021 0959   BUN 22 05/12/2021 0959   BUN 19 01/03/2018 1108   CREATININE 0.86 05/12/2021 0959   CREATININE 1.01 (H) 09/09/2018 0913   CREATININE 0.85 08/22/2014 1031      Component Value Date/Time   CALCIUM 9.4 05/12/2021 0959   ALKPHOS 82 05/12/2021 0959   AST 19 05/12/2021 0959   AST 15 04/18/2018 1110   ALT <6 05/12/2021 0959   ALT <6 04/18/2018 1110   BILITOT 0.3 05/12/2021 0959   BILITOT 0.3 04/18/2018 1110

## 2021-05-19 NOTE — Assessment & Plan Note (Signed)
She will complete fourth dose of rituximab today So far, she has not responded to rituximab yet She will continue prednisone twice a week I plan to see her back in a month for further follow-up

## 2021-05-19 NOTE — Assessment & Plan Note (Signed)
The pancytopenia is multifactorial I believe the patient may have component of autoimmune in the background Her recent bone marrow is unremarkable for malignancy Observe for now She does not need treatment for leukopenia or anemia

## 2021-05-24 IMAGING — DX DG HIP (WITH OR WITHOUT PELVIS) 2-3V*L*
3 series · 3 of 3 positions shown · non-contrast
Comparison: None.

CLINICAL DATA: Pain.

EXAM:
DG HIP (WITH OR WITHOUT PELVIS) 2-3V LEFT

[pelvis ap]
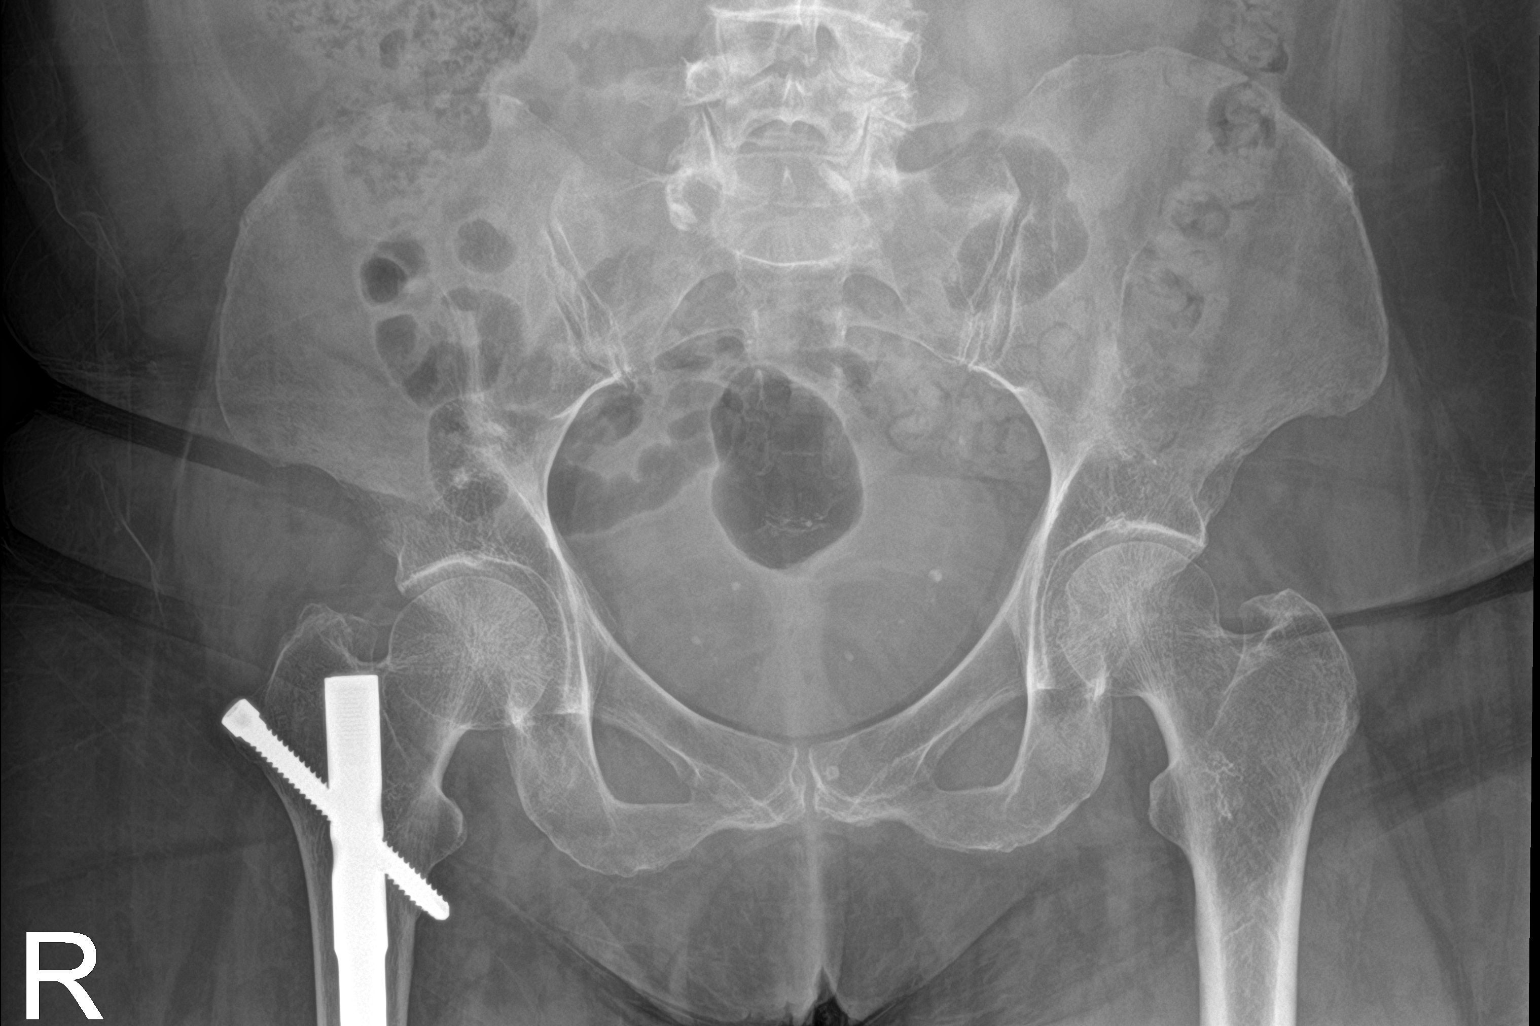

[hip ap]
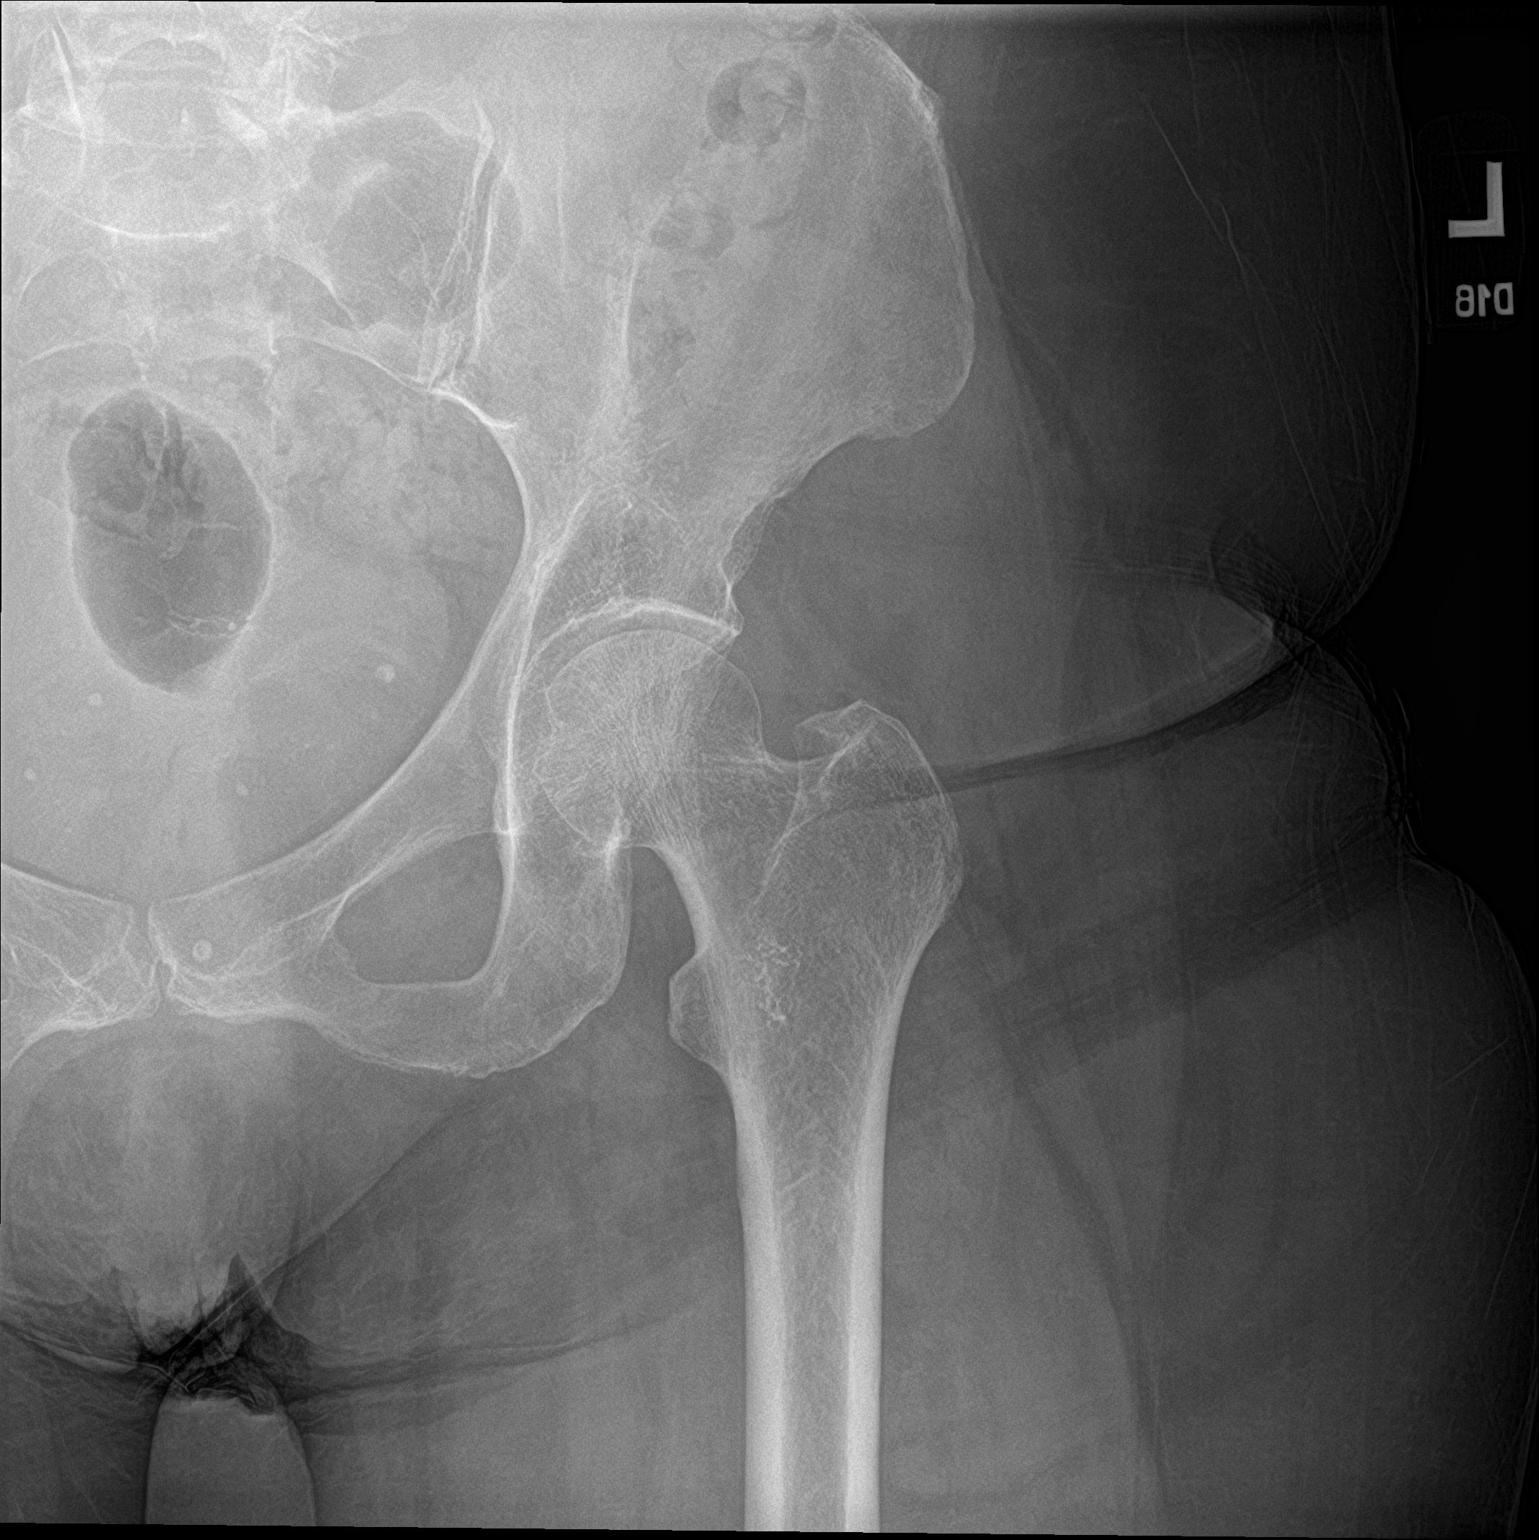

[hip lat]
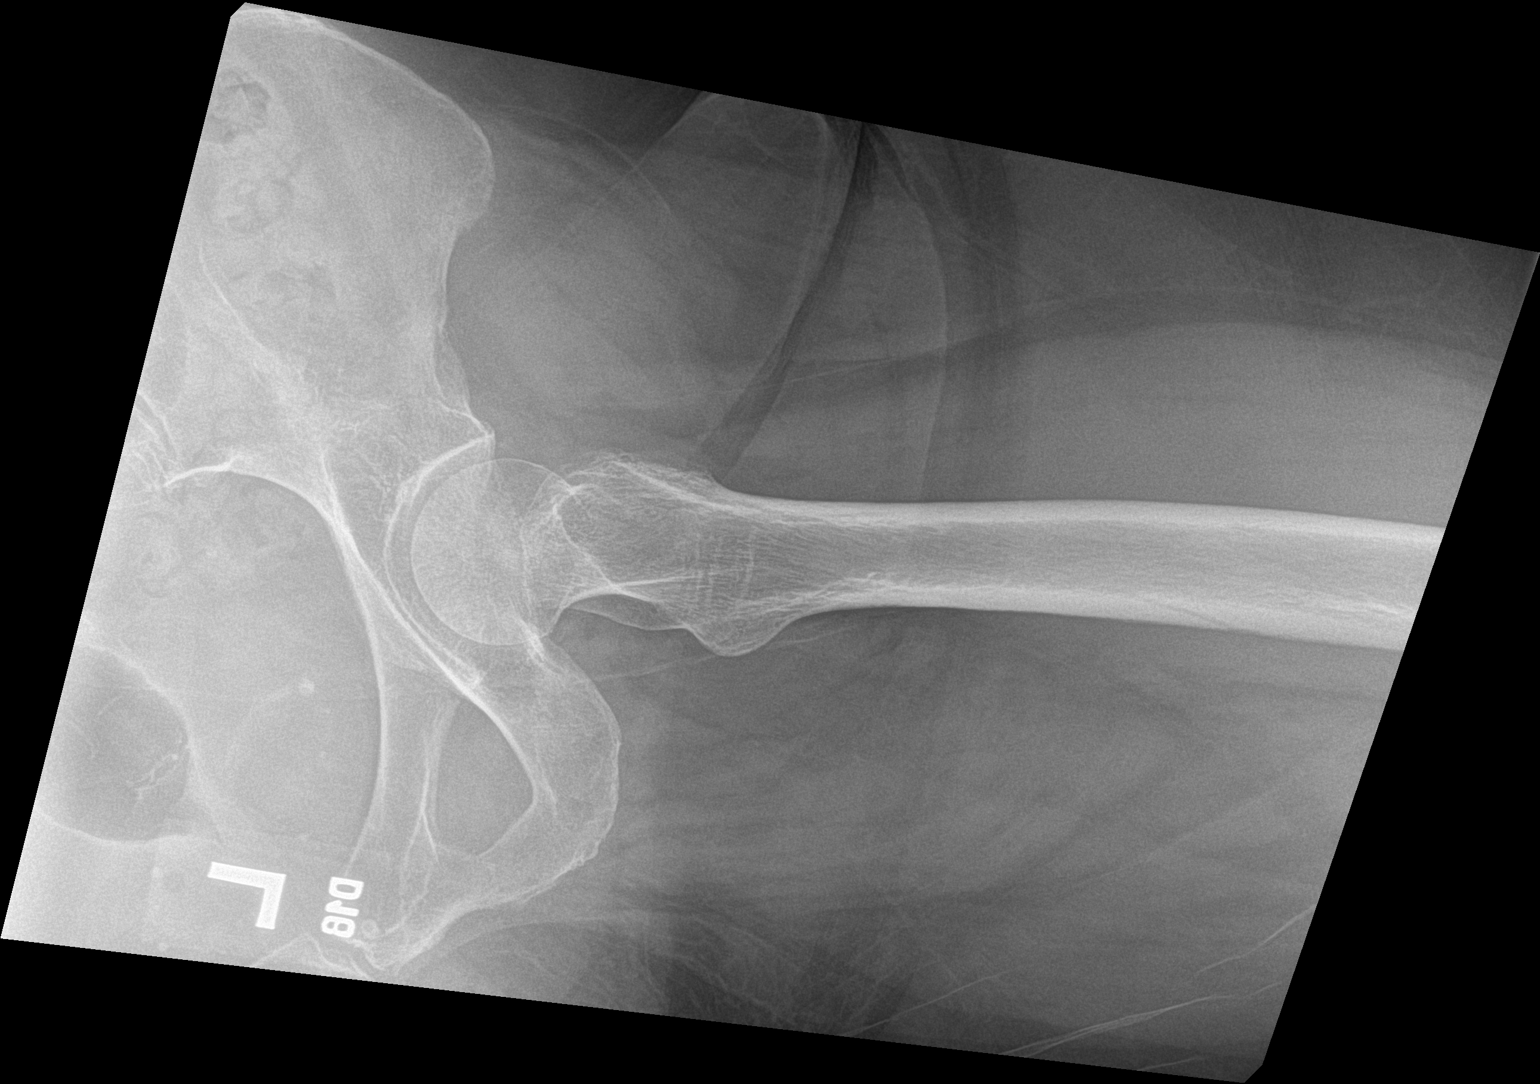

[3 of 3 positions shown; findings below may reference images not displayed]

FINDINGS: The patient is status post prior right-sided intramedullary nail
placement. There are mild degenerative changes of both hips. There
is osteopenia. Phleboliths project over the patient's pelvis. There
is no acute displaced fracture or dislocation.
IMPRESSION: No acute displaced fracture or dislocation. Mild degenerative
changes of both hips.

## 2021-06-18 ENCOUNTER — Other Ambulatory Visit: Payer: Self-pay | Admitting: Hematology and Oncology

## 2021-06-20 ENCOUNTER — Telehealth: Payer: Self-pay

## 2021-06-20 NOTE — Telephone Encounter (Signed)
Per the caregiver. She has enough until appt.

## 2021-06-20 NOTE — Telephone Encounter (Signed)
Called ask caregiver if she has enough Prednisone to last until appt on 6/30. She has enough to last until next appt.

## 2021-06-21 ENCOUNTER — Encounter: Payer: Self-pay | Admitting: Hematology and Oncology

## 2021-06-23 ENCOUNTER — Telehealth: Payer: Self-pay

## 2021-06-23 ENCOUNTER — Inpatient Hospital Stay (HOSPITAL_BASED_OUTPATIENT_CLINIC_OR_DEPARTMENT_OTHER): Payer: Medicare Other | Admitting: Hematology and Oncology

## 2021-06-23 ENCOUNTER — Inpatient Hospital Stay: Payer: Medicare Other | Attending: Hematology and Oncology

## 2021-06-23 ENCOUNTER — Encounter: Payer: Self-pay | Admitting: Hematology and Oncology

## 2021-06-23 ENCOUNTER — Other Ambulatory Visit: Payer: Self-pay

## 2021-06-23 DIAGNOSIS — D72819 Decreased white blood cell count, unspecified: Secondary | ICD-10-CM

## 2021-06-23 DIAGNOSIS — R768 Other specified abnormal immunological findings in serum: Secondary | ICD-10-CM

## 2021-06-23 DIAGNOSIS — R760 Raised antibody titer: Secondary | ICD-10-CM | POA: Diagnosis not present

## 2021-06-23 DIAGNOSIS — D693 Immune thrombocytopenic purpura: Secondary | ICD-10-CM | POA: Diagnosis present

## 2021-06-23 DIAGNOSIS — Z299 Encounter for prophylactic measures, unspecified: Secondary | ICD-10-CM

## 2021-06-23 DIAGNOSIS — E538 Deficiency of other specified B group vitamins: Secondary | ICD-10-CM

## 2021-06-23 DIAGNOSIS — D61818 Other pancytopenia: Secondary | ICD-10-CM

## 2021-06-23 LAB — CBC WITH DIFFERENTIAL/PLATELET
Abs Immature Granulocytes: 0.01 10*3/uL (ref 0.00–0.07)
Basophils Absolute: 0 10*3/uL (ref 0.0–0.1)
Basophils Relative: 1 %
Eosinophils Absolute: 0 10*3/uL (ref 0.0–0.5)
Eosinophils Relative: 0 %
HCT: 35.8 % — ABNORMAL LOW (ref 36.0–46.0)
Hemoglobin: 11.7 g/dL — ABNORMAL LOW (ref 12.0–15.0)
Immature Granulocytes: 0 %
Lymphocytes Relative: 39 %
Lymphs Abs: 1.5 10*3/uL (ref 0.7–4.0)
MCH: 32.6 pg (ref 26.0–34.0)
MCHC: 32.7 g/dL (ref 30.0–36.0)
MCV: 99.7 fL (ref 80.0–100.0)
Monocytes Absolute: 0.3 10*3/uL (ref 0.1–1.0)
Monocytes Relative: 9 %
Neutro Abs: 2 10*3/uL (ref 1.7–7.7)
Neutrophils Relative %: 51 %
Platelets: 107 10*3/uL — ABNORMAL LOW (ref 150–400)
RBC: 3.59 MIL/uL — ABNORMAL LOW (ref 3.87–5.11)
RDW: 12.5 % (ref 11.5–15.5)
WBC: 3.9 10*3/uL — ABNORMAL LOW (ref 4.0–10.5)
nRBC: 0 % (ref 0.0–0.2)

## 2021-06-23 LAB — COMPREHENSIVE METABOLIC PANEL
ALT: <6 U/L (ref 0–44)
AST: 20 U/L (ref 15–41)
Albumin: 3.4 g/dL — ABNORMAL LOW (ref 3.5–5.0)
Alkaline Phosphatase: 99 U/L (ref 38–126)
Anion gap: 7 (ref 5–15)
BUN: 27 mg/dL — ABNORMAL HIGH (ref 8–23)
CO2: 28 mmol/L (ref 22–32)
Calcium: 9.2 mg/dL (ref 8.9–10.3)
Chloride: 103 mmol/L (ref 98–111)
Creatinine, Ser: 0.82 mg/dL (ref 0.44–1.00)
GFR, Estimated: >60 mL/min (ref >60)
Glucose, Bld: 79 mg/dL (ref 70–99)
Potassium: 4.1 mmol/L (ref 3.5–5.1)
Sodium: 138 mmol/L (ref 135–145)
Total Bilirubin: 0.4 mg/dL (ref 0.3–1.2)
Total Protein: 6.3 g/dL — ABNORMAL LOW (ref 6.5–8.1)

## 2021-06-23 MED ORDER — PREDNISONE 2.5 MG PO TABS: ORAL_TABLET | ORAL | 9 refills | Status: DC

## 2021-06-23 NOTE — Assessment & Plan Note (Signed)
Recommend she continue vitamin B12 and vitamin D supplement indefinitely

## 2021-06-23 NOTE — Assessment & Plan Note (Signed)
Her prior positive ANA test could indicate some sort of autoimmune disorder and rituximab would be an appropriate treatment for that Observe closely for now She is not symptomatic

## 2021-06-23 NOTE — Telephone Encounter (Signed)
Mailed out office to [employer].

## 2021-06-23 NOTE — Assessment & Plan Note (Signed)
She is not symptomatic. Observe for now 

## 2021-06-23 NOTE — Telephone Encounter (Signed)
-----   Message from [name], MD sent at 06/23/2021 10:12 AM EDT ----- I have completed my note Please print a copy of my notes and mail it to [facility]

## 2021-06-23 NOTE — Progress Notes (Signed)
[name] OFFICE PROGRESS NOTE  Heather [name], [place]  ASSESSMENT & PLAN:  Chronic ITP (idiopathic thrombocytopenia) (HCC) She will complete rituximab a month ago Her platelet count has improved but not all the way to normal Moving forward, I recommend she continue prednisone 2.5 mg twice a week and I plan to see her again in 3 months for further follow-up  Chronic leukopenia She is not symptomatic Observe for now  Positive ANA (antinuclear antibody) Her prior positive ANA test could indicate some sort of autoimmune disorder and rituximab would be an appropriate treatment for that Observe closely for now She is not symptomatic  Preventive measure Recommend she continue vitamin B12 and vitamin D supplement indefinitely  No orders of the defined types were placed in this encounter.   The total time spent in the appointment was 20 minutes encounter with patients including review of chart and various tests results, discussions about plan of care and coordination of care plan   All questions were answered. The patient knows to call the clinic with any problems, questions or concerns. No barriers to learning was detected.    Heather Lark, MD 6/30/202210:11 AM  INTERVAL HISTORY: Heather [name] 60 y.o. female returns for further follow-up on chronic pancytopenia She is here accompanied by her caregiver She is doing well No recent infection Denies bleeding  SUMMARY OF HEMATOLOGIC HISTORY:  Heather [name] is here because of thrombocytopenia. She has mild mental deficit.  Her background history is not clear. She lives with Heather [name] for 4 years.  She was found to have abnormal CBC from recent blood work. She has normal CBC as of last year. Most recently, repeat CBC show mild leukopenia with a platelet count of 89,000.  That was subsequently repeated and it got even lower to 69,000. She had discovered recent bruising but denies spontaneous epistaxis, hematuria,  melena or hematochezia The patient denies history of liver disease, exposure to heparin, history of cardiac murmur/prior cardiovascular surgery or recent new medications She had received blood transfusion in the past. Although the patient denies a history of liver disease, ultrasound of the abdomen from April 2012 revealed fatty infiltration of the liver, suspicious for possible fatty liver disease versus other liver condition. She denies recent new medications.  She denies recent infection. Further workup revealed evidence of liver lesion on CT imaging and positive ANA screen. She was being observed from the hematology standpoint to progression of pancytopenia On 09/09/2018, she was started on 40 mg of prednisone daily On October 1st, 2019, the dose of prednisone is reduced to 20 mg daily On October 01, 2018, dose of prednisone is reduced to 10 mg daily On October 08, 2018, the dose of prednisone is reduced to 5 mg Bone density scan in November 2019 showed osteopenia with T score of -2.4 On March 06, 2019, the dose of prednisone is reduced to 2.5 mg daily On March 24, 2019, the dose of prednisone is adjusted to 2.5 mg alternate with 5 mg every other day Starting in February, dose of prednisone is reduced to 2.5 mg daily Starting 03/18/2020, prednisone dose is reduced to every other day at 2.5 mg and then twice a week.  She was found to have vitamin B12 deficiency and started taking oral vitamin B12 supplementation On 03/17/2021, she underwent bone marrow aspirate and biopsy which showed mild hypocellular bone marrow without dysplasia In May 2022, she received 4 doses of rituximab and is maintained on 2.5 mg prednisone twice a  week  I have reviewed the past medical history, past surgical history, social history and family history with the patient and they are unchanged from previous note.  ALLERGIES:  has No Known Allergies.  MEDICATIONS:  Current Outpatient Medications  Medication Sig Dispense  Refill   cholecalciferol (VITAMIN D3) 25 MCG (1000 UNIT) tablet Take 2,000 Units by mouth daily.     desvenlafaxine (PRISTIQ) 50 MG 24 hr tablet Take 1 tablet (50 mg total) by mouth daily. 30 tablet 0   meloxicam (MOBIC) 7.5 MG tablet Take 15 mg by mouth daily.     MYRBETRIQ 25 MG TB24 tablet Take 25 mg by mouth daily.     predniSONE (DELTASONE) 2.5 MG tablet Take 2.5 mg twice a week by mouth 30 tablet 9   traMADol (ULTRAM) 50 MG tablet Take 1 tablet (50 mg total) by mouth every 6 (six) hours as needed. 15 tablet 0   triamcinolone cream (KENALOG) 0.1 % Apply thin layer to affected area TID PRN itching 30 g 0   vitamin B-12 (CYANOCOBALAMIN) 1000 MCG tablet Take 1,000 mcg by mouth daily.     No current facility-administered medications for this visit.     REVIEW OF SYSTEMS:   Constitutional: Denies fevers, chills or night sweats Eyes: Denies blurriness of vision Ears, nose, mouth, throat, and face: Denies mucositis or sore throat Respiratory: Denies cough, dyspnea or wheezes Cardiovascular: Denies palpitation, chest discomfort or lower extremity swelling Gastrointestinal:  Denies nausea, heartburn or change in bowel habits Skin: Denies abnormal skin rashes Lymphatics: Denies new lymphadenopathy or easy bruising Neurological:Denies numbness, tingling or new weaknesses Behavioral/Psych: Mood is stable, no new changes  All other systems were reviewed with the patient and are negative.  PHYSICAL EXAMINATION: ECOG PERFORMANCE STATUS: 1 - Symptomatic but completely ambulatory  Vitals:   06/23/21 0947  BP: (!) 108/54  Pulse: 83  Resp: 17  Temp: (!) 97.2 F (36.2 C)  SpO2: 100%   Filed Weights   06/23/21 0947  Weight: 125 lb 12.8 oz (57.1 kg)    GENERAL:alert, no distress and comfortable SKIN: skin color, texture, turgor are normal, no rashes or significant lesions EYES: normal, Conjunctiva are pink and non-injected, sclera clear OROPHARYNX:no exudate, no erythema and lips, buccal  mucosa, and tongue normal  NECK: supple, thyroid normal size, non-tender, without nodularity LYMPH:  no palpable lymphadenopathy in the cervical, axillary or inguinal LUNGS: clear to auscultation and percussion with normal breathing effort HEART: regular rate & rhythm and no murmurs and no lower extremity edema ABDOMEN:abdomen soft, non-tender and normal bowel sounds Musculoskeletal:no cyanosis of digits and no clubbing  NEURO: alert & oriented x 3 with fluent speech, no focal motor/sensory deficits  LABORATORY DATA:  I have reviewed the data as listed     Component Value Date/Time   NA 143 05/19/2021 1036   NA 143 01/03/2018 1108   K 4.3 05/19/2021 1036   CL 106 05/19/2021 1036   CO2 29 05/19/2021 1036   GLUCOSE 75 05/19/2021 1036   BUN 21 05/19/2021 1036   BUN 19 01/03/2018 1108   CREATININE 0.92 05/19/2021 1036   CREATININE 1.01 (H) 09/09/2018 0913   CREATININE 0.85 08/22/2014 1031   CALCIUM 9.6 05/19/2021 1036   PROT 6.3 (L) 05/19/2021 1036   PROT 6.2 01/03/2018 1108   ALBUMIN 3.6 05/19/2021 1036   ALBUMIN 3.9 01/03/2018 1108   AST 17 05/19/2021 1036   AST 15 04/18/2018 1110   ALT <6 05/19/2021 1036   ALT <6 04/18/2018  1110   ALKPHOS 80 05/19/2021 1036   BILITOT 0.3 05/19/2021 1036   BILITOT 0.3 04/18/2018 1110   GFRNONAA >60 05/19/2021 1036   GFRNONAA 60 (L) 09/09/2018 0913   GFRAA >60 10/15/2018 0823   GFRAA >60 09/09/2018 0913    No results found for: SPEP, UPEP  Lab Results  Component Value Date   WBC 3.9 (L) 06/23/2021   NEUTROABS 2.0 06/23/2021   HGB 11.7 (L) 06/23/2021   HCT 35.8 (L) 06/23/2021   MCV 99.7 06/23/2021   PLT 107 (L) 06/23/2021      Chemistry      Component Value Date/Time   NA 143 05/19/2021 1036   NA 143 01/03/2018 1108   K 4.3 05/19/2021 1036   CL 106 05/19/2021 1036   CO2 29 05/19/2021 1036   BUN 21 05/19/2021 1036   BUN 19 01/03/2018 1108   CREATININE 0.92 05/19/2021 1036   CREATININE 1.01 (H) 09/09/2018 0913   CREATININE  0.85 08/22/2014 1031      Component Value Date/Time   CALCIUM 9.6 05/19/2021 1036   ALKPHOS 80 05/19/2021 1036   AST 17 05/19/2021 1036   AST 15 04/18/2018 1110   ALT <6 05/19/2021 1036   ALT <6 04/18/2018 1110   BILITOT 0.3 05/19/2021 1036   BILITOT 0.3 04/18/2018 1110

## 2021-06-23 NOTE — Assessment & Plan Note (Signed)
She will complete rituximab a month ago Her platelet count has improved but not all the way to normal Moving forward, I recommend she continue prednisone 2.5 mg twice a week and I plan to see her again in 3 months for further follow-up

## 2021-09-15 DIAGNOSIS — Z131 Encounter for screening for diabetes mellitus: Secondary | ICD-10-CM | POA: Diagnosis not present

## 2021-09-15 DIAGNOSIS — I69319 Unspecified symptoms and signs involving cognitive functions following cerebral infarction: Secondary | ICD-10-CM | POA: Diagnosis not present

## 2021-09-15 DIAGNOSIS — R109 Unspecified abdominal pain: Secondary | ICD-10-CM | POA: Diagnosis not present

## 2021-09-15 DIAGNOSIS — M79605 Pain in left leg: Secondary | ICD-10-CM | POA: Diagnosis not present

## 2021-09-15 DIAGNOSIS — D696 Thrombocytopenia, unspecified: Secondary | ICD-10-CM | POA: Diagnosis not present

## 2021-09-15 DIAGNOSIS — R251 Tremor, unspecified: Secondary | ICD-10-CM | POA: Diagnosis not present

## 2021-09-15 DIAGNOSIS — M4186 Other forms of scoliosis, lumbar region: Secondary | ICD-10-CM | POA: Diagnosis not present

## 2021-09-15 DIAGNOSIS — E782 Mixed hyperlipidemia: Secondary | ICD-10-CM | POA: Diagnosis not present

## 2021-09-22 ENCOUNTER — Other Ambulatory Visit: Payer: Medicare Other

## 2021-09-22 ENCOUNTER — Ambulatory Visit: Payer: Medicare Other | Admitting: Hematology and Oncology

## 2021-09-29 ENCOUNTER — Inpatient Hospital Stay (HOSPITAL_BASED_OUTPATIENT_CLINIC_OR_DEPARTMENT_OTHER): Payer: Medicare Other | Admitting: Hematology and Oncology

## 2021-09-29 ENCOUNTER — Encounter: Payer: Self-pay | Admitting: Hematology and Oncology

## 2021-09-29 ENCOUNTER — Inpatient Hospital Stay: Payer: Medicare Other

## 2021-09-29 ENCOUNTER — Other Ambulatory Visit: Payer: Self-pay

## 2021-09-29 ENCOUNTER — Inpatient Hospital Stay: Payer: Medicare Other | Attending: Hematology and Oncology

## 2021-09-29 DIAGNOSIS — E538 Deficiency of other specified B group vitamins: Secondary | ICD-10-CM

## 2021-09-29 DIAGNOSIS — D693 Immune thrombocytopenic purpura: Secondary | ICD-10-CM | POA: Insufficient documentation

## 2021-09-29 DIAGNOSIS — D61818 Other pancytopenia: Secondary | ICD-10-CM

## 2021-09-29 DIAGNOSIS — Z23 Encounter for immunization: Secondary | ICD-10-CM | POA: Diagnosis not present

## 2021-09-29 DIAGNOSIS — M8580 Other specified disorders of bone density and structure, unspecified site: Secondary | ICD-10-CM | POA: Diagnosis not present

## 2021-09-29 DIAGNOSIS — Z79899 Other long term (current) drug therapy: Secondary | ICD-10-CM | POA: Insufficient documentation

## 2021-09-29 DIAGNOSIS — Z299 Encounter for prophylactic measures, unspecified: Secondary | ICD-10-CM | POA: Diagnosis not present

## 2021-09-29 DIAGNOSIS — Z7952 Long term (current) use of systemic steroids: Secondary | ICD-10-CM | POA: Insufficient documentation

## 2021-09-29 LAB — CBC WITH DIFFERENTIAL/PLATELET
Abs Immature Granulocytes: 0.01 10*3/uL (ref 0.00–0.07)
Basophils Absolute: 0 10*3/uL (ref 0.0–0.1)
Basophils Relative: 0 %
Eosinophils Absolute: 0 10*3/uL (ref 0.0–0.5)
Eosinophils Relative: 0 %
HCT: 34.4 % — ABNORMAL LOW (ref 36.0–46.0)
Hemoglobin: 11 g/dL — ABNORMAL LOW (ref 12.0–15.0)
Immature Granulocytes: 0 %
Lymphocytes Relative: 21 %
Lymphs Abs: 1 10*3/uL (ref 0.7–4.0)
MCH: 32.3 pg (ref 26.0–34.0)
MCHC: 32 g/dL (ref 30.0–36.0)
MCV: 100.9 fL — ABNORMAL HIGH (ref 80.0–100.0)
Monocytes Absolute: 0.3 10*3/uL (ref 0.1–1.0)
Monocytes Relative: 7 %
Neutro Abs: 3.3 10*3/uL (ref 1.7–7.7)
Neutrophils Relative %: 72 %
Platelets: 173 10*3/uL (ref 150–400)
RBC: 3.41 MIL/uL — ABNORMAL LOW (ref 3.87–5.11)
RDW: 13.2 % (ref 11.5–15.5)
WBC: 4.6 10*3/uL (ref 4.0–10.5)
nRBC: 0 % (ref 0.0–0.2)

## 2021-09-29 LAB — COMPREHENSIVE METABOLIC PANEL
ALT: <5 U/L (ref 0–44)
AST: 16 U/L (ref 15–41)
Albumin: 3.5 g/dL (ref 3.5–5.0)
Alkaline Phosphatase: 100 U/L (ref 38–126)
Anion gap: 8 (ref 5–15)
BUN: 24 mg/dL — ABNORMAL HIGH (ref 8–23)
CO2: 28 mmol/L (ref 22–32)
Calcium: 9.7 mg/dL (ref 8.9–10.3)
Chloride: 106 mmol/L (ref 98–111)
Creatinine, Ser: 1.14 mg/dL — ABNORMAL HIGH (ref 0.44–1.00)
GFR, Estimated: 54 mL/min — ABNORMAL LOW (ref >60)
Glucose, Bld: 76 mg/dL (ref 70–99)
Potassium: 4.1 mmol/L (ref 3.5–5.1)
Sodium: 142 mmol/L (ref 135–145)
Total Bilirubin: 0.3 mg/dL (ref 0.3–1.2)
Total Protein: 6.5 g/dL (ref 6.5–8.1)

## 2021-09-29 MED ORDER — PREDNISONE 2.5 MG PO TABS: ORAL_TABLET | ORAL | 9 refills | Status: DC

## 2021-09-29 MED ORDER — INFLUENZA VAC SPLIT QUAD 0.5 ML IM SUSY
0.5000 mL | PREFILLED_SYRINGE | Freq: Once | INTRAMUSCULAR | Status: AC
  Administered 2021-09-29: 0.5 mL via INTRAMUSCULAR
  Filled 2021-09-29: qty 0.5

## 2021-09-29 NOTE — Assessment & Plan Note (Signed)
We discussed the importance of preventive care and reviewed the vaccination programs. She does not have any prior allergic reactions to influenza vaccination. She agrees to proceed with influenza vaccination today and we will administer it today at the clinic.  

## 2021-09-29 NOTE — Assessment & Plan Note (Signed)
I believe rituximab is finally working Thrombocytopenia and leukopenia has completely resolved She has mild anemia likely due to anemia of chronic illness I recommend reducing prednisone to once a week and see her back in the month If her next set of blood work still looks good, I will space out her appointment to every 6 months

## 2021-09-29 NOTE — Progress Notes (Signed)
[facility] OFFICE PROGRESS NOTE  [name], [place]  ASSESSMENT & PLAN:  Chronic ITP (idiopathic thrombocytopenia) (HCC) I believe rituximab is finally working Thrombocytopenia and leukopenia has completely resolved She has mild anemia likely due to anemia of chronic illness I recommend reducing prednisone to once a week and see her back in the month If her next set of blood work still looks good, I will space out her appointment to every 6 months  Vitamin B12 deficiency She has history of vitamin B12 deficiency Recheck vitamin B12 is adequate She does not need vitamin B-12 injection now Plan to recheck in 6 months  Preventive measure We discussed the importance of preventive care and reviewed the vaccination programs. She does not have any prior allergic reactions to influenza vaccination. She agrees to proceed with influenza vaccination today and we will administer it today at the clinic.   No orders of the defined types were placed in this encounter.   The total time spent in the appointment was 20 minutes encounter with patients including review of chart and various tests results, discussions about plan of care and coordination of care plan   All questions were answered. The patient knows to call the clinic with any problems, questions or concerns. No barriers to learning was detected.    [name], MD 10/6/202211:55 AM  INTERVAL HISTORY: Heather [name] 60 y.o. female returns for further follow-up on chronic ITP, history of borderline vitamin B12 deficiency, recently treated with rituximab and ongoing twice daily prednisone She is here accompanied by her caregiver She is doing very well No recent infection, fever or chills No recent bleeding  SUMMARY OF HEMATOLOGIC HISTORY:  [name] is here because of thrombocytopenia. She has mild mental deficit.  Her background history is not clear. She lives with Heather [name] for 4 years.  She was found  to have abnormal CBC from recent blood work. She has normal CBC as of last year. Most recently, repeat CBC show mild leukopenia with a platelet count of 89,000.  That was subsequently repeated and it got even lower to 69,000. She had discovered recent bruising but denies spontaneous epistaxis, hematuria, melena or hematochezia The patient denies history of liver disease, exposure to heparin, history of cardiac murmur/prior cardiovascular surgery or recent new medications She had received blood transfusion in the past. Although the patient denies a history of liver disease, ultrasound of the abdomen from April 2012 revealed fatty infiltration of the liver, suspicious for possible fatty liver disease versus other liver condition. She denies recent new medications.  She denies recent infection. Further workup revealed evidence of liver lesion on CT imaging and positive ANA screen. She was being observed from the hematology standpoint to progression of pancytopenia On 09/09/2018, she was started on 40 mg of prednisone daily On October 1st, 2019, the dose of prednisone is reduced to 20 mg daily On October 01, 2018, dose of prednisone is reduced to 10 mg daily On October 08, 2018, the dose of prednisone is reduced to 5 mg Bone density scan in November 2019 showed osteopenia with T score of -2.4 On March 06, 2019, the dose of prednisone is reduced to 2.5 mg daily On March 24, 2019, the dose of prednisone is adjusted to 2.5 mg alternate with 5 mg every other day Starting in February, dose of prednisone is reduced to 2.5 mg daily Starting 03/18/2020, prednisone dose is reduced to every other day at 2.5 mg and then twice a week.  She was found to have vitamin B12 deficiency and started taking oral vitamin B12 supplementation On 03/17/2021, she underwent bone marrow aspirate and biopsy which showed mild hypocellular bone marrow without dysplasia In May 2022, she received 4 doses of rituximab and is maintained  on 2.5 mg prednisone twice a week   I have reviewed the past medical history, past surgical history, social history and family history with the patient and they are unchanged from previous note.  ALLERGIES:  has No Known Allergies.  MEDICATIONS:  Current Outpatient Medications  Medication Sig Dispense Refill   cholecalciferol (VITAMIN D3) 25 MCG (1000 UNIT) tablet Take 2,000 Units by mouth daily.     desvenlafaxine (PRISTIQ) 50 MG 24 hr tablet Take 1 tablet (50 mg total) by mouth daily. 30 tablet 0   meloxicam (MOBIC) 7.5 MG tablet Take 15 mg by mouth daily.     MYRBETRIQ 25 MG TB24 tablet Take 25 mg by mouth daily.     predniSONE (DELTASONE) 2.5 MG tablet Take 2.5 mg once a week by mouth 30 tablet 9   traMADol (ULTRAM) 50 MG tablet Take 1 tablet (50 mg total) by mouth every 6 (six) hours as needed. 15 tablet 0   triamcinolone cream (KENALOG) 0.1 % Apply thin layer to affected area TID PRN itching 30 g 0   vitamin B-12 (CYANOCOBALAMIN) 1000 MCG tablet Take 1,000 mcg by mouth daily.     No current facility-administered medications for this visit.   Facility-Administered Medications Ordered in Other Visits  Medication Dose Route Frequency Provider Last Rate Last Admin   influenza vac split quadrivalent PF (FLUARIX) injection 0.5 mL  0.5 mL Intramuscular Once Alvy Bimler, Keoshia Steinmetz, MD         REVIEW OF SYSTEMS:   Constitutional: Denies fevers, chills or night sweats Eyes: Denies blurriness of vision Ears, nose, mouth, throat, and face: Denies mucositis or sore throat Respiratory: Denies cough, dyspnea or wheezes Cardiovascular: Denies palpitation, chest discomfort or lower extremity swelling Gastrointestinal:  Denies nausea, heartburn or change in bowel habits Skin: Denies abnormal skin rashes Lymphatics: Denies new lymphadenopathy or easy bruising Neurological:Denies numbness, tingling or new weaknesses Behavioral/Psych: Mood is stable, no new changes  All other systems were reviewed with  the patient and are negative.  PHYSICAL EXAMINATION: ECOG PERFORMANCE STATUS: 0 - Asymptomatic  Vitals:   09/29/21 1131  BP: 118/60  Pulse: 78  Resp: 18  Temp: 98.2 F (36.8 C)  SpO2: 100%   Filed Weights   09/29/21 1131  Weight: 126 lb 12.8 oz (57.5 kg)    GENERAL:alert, no distress and comfortable NEURO: alert & oriented x 3 with fluent speech, no focal motor/sensory deficits  LABORATORY DATA:  I have reviewed the data as listed     Component Value Date/Time   NA 142 09/29/2021 1043   NA 143 01/03/2018 1108   K 4.1 09/29/2021 1043   CL 106 09/29/2021 1043   CO2 28 09/29/2021 1043   GLUCOSE 76 09/29/2021 1043   BUN 24 (H) 09/29/2021 1043   BUN 19 01/03/2018 1108   CREATININE 1.14 (H) 09/29/2021 1043   CREATININE 1.01 (H) 09/09/2018 0913   CREATININE 0.85 08/22/2014 1031   CALCIUM 9.7 09/29/2021 1043   PROT 6.5 09/29/2021 1043   PROT 6.2 01/03/2018 1108   ALBUMIN 3.5 09/29/2021 1043   ALBUMIN 3.9 01/03/2018 1108   AST 16 09/29/2021 1043   AST 15 04/18/2018 1110   ALT <5 09/29/2021 1043   ALT <6 04/18/2018 1110  ALKPHOS 100 09/29/2021 1043   BILITOT 0.3 09/29/2021 1043   BILITOT 0.3 04/18/2018 1110   GFRNONAA 54 (L) 09/29/2021 1043   GFRNONAA 60 (L) 09/09/2018 0913   GFRAA >60 10/15/2018 0823   GFRAA >60 09/09/2018 0913    No results found for: SPEP, UPEP  Lab Results  Component Value Date   WBC 4.6 09/29/2021   NEUTROABS 3.3 09/29/2021   HGB 11.0 (L) 09/29/2021   HCT 34.4 (L) 09/29/2021   MCV 100.9 (H) 09/29/2021   PLT 173 09/29/2021      Chemistry      Component Value Date/Time   NA 142 09/29/2021 1043   NA 143 01/03/2018 1108   K 4.1 09/29/2021 1043   CL 106 09/29/2021 1043   CO2 28 09/29/2021 1043   BUN 24 (H) 09/29/2021 1043   BUN 19 01/03/2018 1108   CREATININE 1.14 (H) 09/29/2021 1043   CREATININE 1.01 (H) 09/09/2018 0913   CREATININE 0.85 08/22/2014 1031      Component Value Date/Time   CALCIUM 9.7 09/29/2021 1043   ALKPHOS  100 09/29/2021 1043   AST 16 09/29/2021 1043   AST 15 04/18/2018 1110   ALT <5 09/29/2021 1043   ALT <6 04/18/2018 1110   BILITOT 0.3 09/29/2021 1043   BILITOT 0.3 04/18/2018 1110

## 2021-09-29 NOTE — Assessment & Plan Note (Signed)
She has history of vitamin B12 deficiency Recheck vitamin B12 is adequate She does not need vitamin B-12 injection now Plan to recheck in 6 months

## 2021-09-29 NOTE — Patient Instructions (Signed)
Influenza (Flu) Vaccine (Inactivated or Recombinant): What You Need to Know 1. Why get vaccinated? Influenza vaccine can prevent influenza (flu). Flu is a contagious disease that spreads around the [place] every year, usually between October and May. Anyone can get the flu, but it is more dangerous for some people. Infants and young children, people 65 years and older, pregnant people, and people with certain health conditions or a weakened immune system are at greatest risk of flu complications. Pneumonia, bronchitis, sinus infections, and ear infections are examples of flu-related complications. If you have a medical condition, such as heart disease, cancer, or diabetes, flu can make it worse. Flu can cause fever and chills, sore throat, muscle aches, fatigue, cough, headache, and runny or stuffy nose. Some people may have vomiting and diarrhea, though this is more common in children than adults. In an average year, thousands of people in the [place] die from flu, and many more are hospitalized. Flu vaccine prevents millions of illnesses and flu-related visits to the doctor each year. 2. Influenza vaccines CDC recommends everyone 6 months and older get vaccinated every flu season. Children 6 months through 8 years of age may need 2 doses during a single flu season. Everyone else needs only 1 dose each flu season. It takes about 2 weeks for protection to develop after vaccination. There are many flu viruses, and they are always changing. Each year a new flu vaccine is made to protect against the influenza viruses believed to be likely to cause disease in the upcoming flu season. Even when the vaccine doesn't exactly match these viruses, it may still provide some protection. Influenza vaccine does not cause flu. Influenza vaccine may be given at the same time as other vaccines. 3. Talk with your health care provider Tell your vaccination provider if the person getting the vaccine: Has had  an allergic reaction after a previous dose of influenza vaccine, or has any severe, life-threatening allergies Has ever had Guillain-Barr Syndrome (also called "GBS") In some cases, your health care provider may decide to postpone influenza vaccination until a future visit. Influenza vaccine can be administered at any time during pregnancy. People who are or will be pregnant during influenza season should receive inactivated influenza vaccine. People with minor illnesses, such as a cold, may be vaccinated. People who are moderately or severely ill should usually wait until they recover before getting influenza vaccine. Your health care provider can give you more information. 4. Risks of a vaccine reaction Soreness, redness, and swelling where the shot is given, fever, muscle aches, and headache can happen after influenza vaccination. There may be a very small increased risk of Guillain-Barr Syndrome (GBS) after inactivated influenza vaccine (the flu shot). Young children who get the flu shot along with pneumococcal vaccine (PCV13) and/or DTaP vaccine at the same time might be slightly more likely to have a seizure caused by fever. Tell your health care provider if a child who is getting flu vaccine has ever had a seizure. People sometimes faint after medical procedures, including vaccination. Tell your provider if you feel dizzy or have vision changes or ringing in the ears. As with any medicine, there is a very remote chance of a vaccine causing a severe allergic reaction, other serious injury, or death. 5. What if there is a serious problem? An allergic reaction could occur after the vaccinated person leaves the clinic. If you see signs of a severe allergic reaction (hives, swelling of the face and throat, difficulty breathing,   a fast heartbeat, dizziness, or weakness), call 9-1-1 and get the person to the nearest hospital. For other signs that concern you, call your health care provider. Adverse  reactions should be reported to the Vaccine Adverse Event Reporting System (VAERS). Your health care provider will usually file this report, or you can do it yourself. Visit the VAERS website at www.vaers.hhs.gov or call 1-800-822-7967. VAERS is only for reporting reactions, and VAERS staff members do not give medical advice. 6. The National Vaccine Injury Compensation Program The National Vaccine Injury Compensation Program (VICP) is a federal program that was created to compensate people who may have been injured by certain vaccines. Claims regarding alleged injury or death due to vaccination have a time limit for filing, which may be as short as two years. Visit the VICP website at www.hrsa.gov/vaccinecompensation or call 1-800-338-2382 to learn about the program and about filing a claim. 7. How can I learn more? Ask your health care provider. Call your local or state health department. Visit the website of the Food and Drug Administration (FDA) for vaccine package inserts and additional information at www.fda.gov/vaccines-blood-biologics/vaccines. Contact the Centers for Disease Control and Prevention (CDC): Call 1-800-232-4636 (1-800-CDC-INFO) or Visit CDC's website at www.cdc.gov/flu. Vaccine Information Statement Inactivated Influenza Vaccine (07/30/2020) This information is not intended to replace advice given to you by your health care provider. Make sure you discuss any questions you have with your health care provider. Document Revised: 09/16/2020 Document Reviewed: 09/16/2020 Elsevier Patient Education  2022 Elsevier Inc.  

## 2021-10-19 ENCOUNTER — Telehealth: Payer: Self-pay

## 2021-10-19 NOTE — Telephone Encounter (Signed)
Called and moved 11/10 appts to 11/11. She verbalized understanding with [facility].

## 2021-10-27 DIAGNOSIS — M4186 Other forms of scoliosis, lumbar region: Secondary | ICD-10-CM | POA: Diagnosis not present

## 2021-10-27 DIAGNOSIS — M79605 Pain in left leg: Secondary | ICD-10-CM | POA: Diagnosis not present

## 2021-10-27 DIAGNOSIS — E782 Mixed hyperlipidemia: Secondary | ICD-10-CM | POA: Diagnosis not present

## 2021-10-27 DIAGNOSIS — U071 COVID-19: Secondary | ICD-10-CM | POA: Diagnosis not present

## 2021-10-27 DIAGNOSIS — D696 Thrombocytopenia, unspecified: Secondary | ICD-10-CM | POA: Diagnosis not present

## 2021-10-27 DIAGNOSIS — I69319 Unspecified symptoms and signs involving cognitive functions following cerebral infarction: Secondary | ICD-10-CM | POA: Diagnosis not present

## 2021-10-27 DIAGNOSIS — R251 Tremor, unspecified: Secondary | ICD-10-CM | POA: Diagnosis not present

## 2021-11-03 ENCOUNTER — Other Ambulatory Visit: Payer: Medicare Other

## 2021-11-03 ENCOUNTER — Ambulatory Visit: Payer: Medicare Other | Admitting: Hematology and Oncology

## 2021-11-04 ENCOUNTER — Other Ambulatory Visit: Payer: Self-pay

## 2021-11-04 ENCOUNTER — Encounter: Payer: Self-pay | Admitting: Hematology and Oncology

## 2021-11-04 ENCOUNTER — Inpatient Hospital Stay: Payer: Medicare Other | Attending: Hematology and Oncology

## 2021-11-04 ENCOUNTER — Inpatient Hospital Stay (HOSPITAL_BASED_OUTPATIENT_CLINIC_OR_DEPARTMENT_OTHER): Payer: Medicare Other | Admitting: Hematology and Oncology

## 2021-11-04 DIAGNOSIS — E538 Deficiency of other specified B group vitamins: Secondary | ICD-10-CM | POA: Diagnosis not present

## 2021-11-04 DIAGNOSIS — D61818 Other pancytopenia: Secondary | ICD-10-CM

## 2021-11-04 DIAGNOSIS — Z79899 Other long term (current) drug therapy: Secondary | ICD-10-CM | POA: Insufficient documentation

## 2021-11-04 DIAGNOSIS — Z7952 Long term (current) use of systemic steroids: Secondary | ICD-10-CM | POA: Insufficient documentation

## 2021-11-04 DIAGNOSIS — D693 Immune thrombocytopenic purpura: Secondary | ICD-10-CM

## 2021-11-04 DIAGNOSIS — M8580 Other specified disorders of bone density and structure, unspecified site: Secondary | ICD-10-CM | POA: Diagnosis not present

## 2021-11-04 LAB — COMPREHENSIVE METABOLIC PANEL
ALT: 6 U/L (ref 0–44)
AST: 15 U/L (ref 15–41)
Albumin: 3.8 g/dL (ref 3.5–5.0)
Alkaline Phosphatase: 107 U/L (ref 38–126)
Anion gap: 9 (ref 5–15)
BUN: 25 mg/dL — ABNORMAL HIGH (ref 8–23)
CO2: 28 mmol/L (ref 22–32)
Calcium: 9.4 mg/dL (ref 8.9–10.3)
Chloride: 107 mmol/L (ref 98–111)
Creatinine, Ser: 0.9 mg/dL (ref 0.44–1.00)
GFR, Estimated: >60 mL/min (ref >60)
Glucose, Bld: 91 mg/dL (ref 70–99)
Potassium: 4.1 mmol/L (ref 3.5–5.1)
Sodium: 144 mmol/L (ref 135–145)
Total Bilirubin: 0.5 mg/dL (ref 0.3–1.2)
Total Protein: 6.5 g/dL (ref 6.5–8.1)

## 2021-11-04 LAB — CBC WITH DIFFERENTIAL/PLATELET
Abs Immature Granulocytes: 0.01 10*3/uL (ref 0.00–0.07)
Basophils Absolute: 0 10*3/uL (ref 0.0–0.1)
Basophils Relative: 0 %
Eosinophils Absolute: 0 10*3/uL (ref 0.0–0.5)
Eosinophils Relative: 0 %
HCT: 35.7 % — ABNORMAL LOW (ref 36.0–46.0)
Hemoglobin: 11.8 g/dL — ABNORMAL LOW (ref 12.0–15.0)
Immature Granulocytes: 0 %
Lymphocytes Relative: 28 %
Lymphs Abs: 1.7 10*3/uL (ref 0.7–4.0)
MCH: 32.6 pg (ref 26.0–34.0)
MCHC: 33.1 g/dL (ref 30.0–36.0)
MCV: 98.6 fL (ref 80.0–100.0)
Monocytes Absolute: 0.2 10*3/uL (ref 0.1–1.0)
Monocytes Relative: 4 %
Neutro Abs: 4 10*3/uL (ref 1.7–7.7)
Neutrophils Relative %: 68 %
Platelets: 226 10*3/uL (ref 150–400)
RBC: 3.62 MIL/uL — ABNORMAL LOW (ref 3.87–5.11)
RDW: 12.5 % (ref 11.5–15.5)
WBC: 6 10*3/uL (ref 4.0–10.5)
nRBC: 0 % (ref 0.0–0.2)

## 2021-11-04 NOTE — Assessment & Plan Note (Signed)
Her platelet count has gone much higher likely due to recent increased dose of prednisone that was prescribed due to respiratory symptoms I am not enthusiastic to change the dose of her current maintenance prednisone I plan to see her again in a few months for further follow-up

## 2021-11-04 NOTE — Progress Notes (Signed)
[facility] OFFICE PROGRESS NOTE  [name], [place]  ASSESSMENT & PLAN:  Chronic ITP (idiopathic thrombocytopenia) (HCC) Her platelet count has gone much higher likely due to recent increased dose of prednisone that was prescribed due to respiratory symptoms I am not enthusiastic to change the dose of her current maintenance prednisone I plan to see her again in a few months for further follow-up  No orders of the defined types were placed in this encounter.   The total time spent in the appointment was 20 minutes encounter with patients including review of chart and various tests results, discussions about plan of care and coordination of care plan   All questions were answered. The patient knows to call the clinic with any problems, questions or concerns. No barriers to learning was detected.    [name], MD 11/11/20224:43 PM  INTERVAL HISTORY: Desiree Hane 60 y.o. female returns for further follow-up of with her caregiver for history of chronic ITP Since last time I saw her, she was prescribed decongestion, inhaler and a higher dose of prednisone for a brief period of time due to recent upper respiratory tract infection She is doing better No recent fevers  SUMMARY OF HEMATOLOGIC HISTORY:  [name] is here because of thrombocytopenia. She has mild mental deficit.  Her background history is not clear. She lives with [name] for 4 years.  She was found to have abnormal CBC from recent blood work. She has normal CBC as of last year. Most recently, repeat CBC show mild leukopenia with a platelet count of 89,000.  That was subsequently repeated and it got even lower to 69,000. She had discovered recent bruising but denies spontaneous epistaxis, hematuria, melena or hematochezia The patient denies history of liver disease, exposure to heparin, history of cardiac murmur/prior cardiovascular surgery or recent new medications She had received  blood transfusion in the past. Although the patient denies a history of liver disease, ultrasound of the abdomen from April 2012 revealed fatty infiltration of the liver, suspicious for possible fatty liver disease versus other liver condition. She denies recent new medications.  She denies recent infection. Further workup revealed evidence of liver lesion on CT imaging and positive ANA screen. She was being observed from the hematology standpoint to progression of pancytopenia On 09/09/2018, she was started on 40 mg of prednisone daily On October 1st, 2019, the dose of prednisone is reduced to 20 mg daily On October 01, 2018, dose of prednisone is reduced to 10 mg daily On October 08, 2018, the dose of prednisone is reduced to 5 mg Bone density scan in November 2019 showed osteopenia with T score of -2.4 On March 06, 2019, the dose of prednisone is reduced to 2.5 mg daily On March 24, 2019, the dose of prednisone is adjusted to 2.5 mg alternate with 5 mg every other day Starting in February, dose of prednisone is reduced to 2.5 mg daily Starting 03/18/2020, prednisone dose is reduced to every other day at 2.5 mg and then twice a week.  She was found to have vitamin B12 deficiency and started taking oral vitamin B12 supplementation On 03/17/2021, she underwent bone marrow aspirate and biopsy which showed mild hypocellular bone marrow without dysplasia In May 2022, she received 4 doses of rituximab and is maintained on 2.5 mg prednisone twice a week  I have reviewed the past medical history, past surgical history, social history and family history with the patient and they are unchanged from  previous note.  ALLERGIES:  has No Known Allergies.  MEDICATIONS:  Current Outpatient Medications  Medication Sig Dispense Refill   cholecalciferol (VITAMIN D3) 25 MCG (1000 UNIT) tablet Take 2,000 Units by mouth daily.     desvenlafaxine (PRISTIQ) 50 MG 24 hr tablet Take 1 tablet (50 mg total) by mouth  daily. 30 tablet 0   meloxicam (MOBIC) 7.5 MG tablet Take 15 mg by mouth daily.     MYRBETRIQ 25 MG TB24 tablet Take 25 mg by mouth daily.     predniSONE (DELTASONE) 2.5 MG tablet Take 2.5 mg once a week by mouth 30 tablet 9   traMADol (ULTRAM) 50 MG tablet Take 1 tablet (50 mg total) by mouth every 6 (six) hours as needed. 15 tablet 0   triamcinolone cream (KENALOG) 0.1 % Apply thin layer to affected area TID PRN itching 30 g 0   vitamin B-12 (CYANOCOBALAMIN) 1000 MCG tablet Take 1,000 mcg by mouth daily.     No current facility-administered medications for this visit.     REVIEW OF SYSTEMS:   Constitutional: Denies fevers, chills or night sweats Eyes: Denies blurriness of vision Ears, nose, mouth, throat, and face: Denies mucositis or sore throat Respiratory: Denies cough, dyspnea or wheezes Cardiovascular: Denies palpitation, chest discomfort or lower extremity swelling Gastrointestinal:  Denies nausea, heartburn or change in bowel habits Skin: Denies abnormal skin rashes Lymphatics: Denies new lymphadenopathy or easy bruising Neurological:Denies numbness, tingling or new weaknesses Behavioral/Psych: Mood is stable, no new changes  All other systems were reviewed with the patient and are negative.  PHYSICAL EXAMINATION: ECOG PERFORMANCE STATUS: 0 - Asymptomatic  Vitals:   11/04/21 0836  BP: (!) 110/57  Pulse: 90  Resp: 18  Temp: (!) 97.4 F (36.3 C)  SpO2: 100%   Filed Weights   11/04/21 0836  Weight: 124 lb 12.8 oz (56.6 kg)    GENERAL:alert, no distress and comfortable SKIN: skin color, texture, turgor are normal, no rashes or significant lesions EYES: normal, Conjunctiva are pink and non-injected, sclera clear OROPHARYNX:no exudate, no erythema and lips, buccal mucosa, and tongue normal  NECK: supple, thyroid normal size, non-tender, without nodularity LYMPH:  no palpable lymphadenopathy in the cervical, axillary or inguinal LUNGS: clear to auscultation and  percussion with normal breathing effort HEART: regular rate & rhythm and no murmurs and no lower extremity edema ABDOMEN:abdomen soft, non-tender and normal bowel sounds Musculoskeletal:no cyanosis of digits and no clubbing  NEURO: alert & oriented x 3 with fluent speech, no focal motor/sensory deficits  LABORATORY DATA:  I have reviewed the data as listed     Component Value Date/Time   NA 144 11/04/2021 0816   NA 143 01/03/2018 1108   K 4.1 11/04/2021 0816   CL 107 11/04/2021 0816   CO2 28 11/04/2021 0816   GLUCOSE 91 11/04/2021 0816   BUN 25 (H) 11/04/2021 0816   BUN 19 01/03/2018 1108   CREATININE 0.90 11/04/2021 0816   CREATININE 1.01 (H) 09/09/2018 0913   CREATININE 0.85 08/22/2014 1031   CALCIUM 9.4 11/04/2021 0816   PROT 6.5 11/04/2021 0816   PROT 6.2 01/03/2018 1108   ALBUMIN 3.8 11/04/2021 0816   ALBUMIN 3.9 01/03/2018 1108   AST 15 11/04/2021 0816   AST 15 04/18/2018 1110   ALT 6 11/04/2021 0816   ALT <6 04/18/2018 1110   ALKPHOS 107 11/04/2021 0816   BILITOT 0.5 11/04/2021 0816   BILITOT 0.3 04/18/2018 1110   GFRNONAA >60 11/04/2021 0816   GFRNONAA  60 (L) 09/09/2018 0913   GFRAA >60 10/15/2018 0823   GFRAA >60 09/09/2018 0913    No results found for: SPEP, UPEP  Lab Results  Component Value Date   WBC 6.0 11/04/2021   NEUTROABS 4.0 11/04/2021   HGB 11.8 (L) 11/04/2021   HCT 35.7 (L) 11/04/2021   MCV 98.6 11/04/2021   PLT 226 11/04/2021      Chemistry      Component Value Date/Time   NA 144 11/04/2021 0816   NA 143 01/03/2018 1108   K 4.1 11/04/2021 0816   CL 107 11/04/2021 0816   CO2 28 11/04/2021 0816   BUN 25 (H) 11/04/2021 0816   BUN 19 01/03/2018 1108   CREATININE 0.90 11/04/2021 0816   CREATININE 1.01 (H) 09/09/2018 0913   CREATININE 0.85 08/22/2014 1031      Component Value Date/Time   CALCIUM 9.4 11/04/2021 0816   ALKPHOS 107 11/04/2021 0816   AST 15 11/04/2021 0816   AST 15 04/18/2018 1110   ALT 6 11/04/2021 0816   ALT <6  04/18/2018 1110   BILITOT 0.5 11/04/2021 0816   BILITOT 0.3 04/18/2018 1110

## 2021-11-08 ENCOUNTER — Telehealth: Payer: Self-pay | Admitting: *Deleted

## 2021-11-08 NOTE — Telephone Encounter (Signed)
Per Dr. Calton Dach directions - mailed copy of OV note 11/04/21 to [name], patient's caregiver.

## 2021-12-01 DIAGNOSIS — E782 Mixed hyperlipidemia: Secondary | ICD-10-CM | POA: Diagnosis not present

## 2021-12-01 DIAGNOSIS — Z0001 Encounter for general adult medical examination with abnormal findings: Secondary | ICD-10-CM | POA: Diagnosis not present

## 2021-12-01 DIAGNOSIS — R251 Tremor, unspecified: Secondary | ICD-10-CM | POA: Diagnosis not present

## 2021-12-01 DIAGNOSIS — I69319 Unspecified symptoms and signs involving cognitive functions following cerebral infarction: Secondary | ICD-10-CM | POA: Diagnosis not present

## 2021-12-01 DIAGNOSIS — M4186 Other forms of scoliosis, lumbar region: Secondary | ICD-10-CM | POA: Diagnosis not present

## 2021-12-01 DIAGNOSIS — D696 Thrombocytopenia, unspecified: Secondary | ICD-10-CM | POA: Diagnosis not present

## 2021-12-01 DIAGNOSIS — M79605 Pain in left leg: Secondary | ICD-10-CM | POA: Diagnosis not present

## 2022-04-06 ENCOUNTER — Ambulatory Visit: Payer: Medicare Other | Admitting: Hematology and Oncology

## 2022-04-06 ENCOUNTER — Other Ambulatory Visit: Payer: Medicare Other

## 2022-04-07 ENCOUNTER — Telehealth: Payer: Self-pay

## 2022-04-07 ENCOUNTER — Inpatient Hospital Stay (HOSPITAL_BASED_OUTPATIENT_CLINIC_OR_DEPARTMENT_OTHER): Payer: Medicare Other | Admitting: Hematology and Oncology

## 2022-04-07 ENCOUNTER — Encounter: Payer: Self-pay | Admitting: Hematology and Oncology

## 2022-04-07 ENCOUNTER — Other Ambulatory Visit: Payer: Self-pay

## 2022-04-07 ENCOUNTER — Inpatient Hospital Stay: Payer: Medicare Other | Attending: Hematology and Oncology

## 2022-04-07 DIAGNOSIS — M8580 Other specified disorders of bone density and structure, unspecified site: Secondary | ICD-10-CM | POA: Diagnosis not present

## 2022-04-07 DIAGNOSIS — D693 Immune thrombocytopenic purpura: Secondary | ICD-10-CM

## 2022-04-07 DIAGNOSIS — Z7952 Long term (current) use of systemic steroids: Secondary | ICD-10-CM | POA: Insufficient documentation

## 2022-04-07 DIAGNOSIS — D72819 Decreased white blood cell count, unspecified: Secondary | ICD-10-CM

## 2022-04-07 DIAGNOSIS — E538 Deficiency of other specified B group vitamins: Secondary | ICD-10-CM

## 2022-04-07 DIAGNOSIS — Z79899 Other long term (current) drug therapy: Secondary | ICD-10-CM | POA: Diagnosis not present

## 2022-04-07 DIAGNOSIS — D61818 Other pancytopenia: Secondary | ICD-10-CM

## 2022-04-07 LAB — COMPREHENSIVE METABOLIC PANEL
ALT: 5 U/L (ref 0–44)
AST: 17 U/L (ref 15–41)
Albumin: 4 g/dL (ref 3.5–5.0)
Alkaline Phosphatase: 96 U/L (ref 38–126)
Anion gap: 4 — ABNORMAL LOW (ref 5–15)
BUN: 22 mg/dL (ref 8–23)
CO2: 31 mmol/L (ref 22–32)
Calcium: 9.7 mg/dL (ref 8.9–10.3)
Chloride: 105 mmol/L (ref 98–111)
Creatinine, Ser: 0.92 mg/dL (ref 0.44–1.00)
GFR, Estimated: >60 mL/min (ref >60)
Glucose, Bld: 73 mg/dL (ref 70–99)
Potassium: 4 mmol/L (ref 3.5–5.1)
Sodium: 140 mmol/L (ref 135–145)
Total Bilirubin: 0.5 mg/dL (ref 0.3–1.2)
Total Protein: 6.8 g/dL (ref 6.5–8.1)

## 2022-04-07 LAB — CBC WITH DIFFERENTIAL/PLATELET
Abs Immature Granulocytes: 0 10*3/uL (ref 0.00–0.07)
Basophils Absolute: 0 10*3/uL (ref 0.0–0.1)
Basophils Relative: 1 %
Eosinophils Absolute: 0 10*3/uL (ref 0.0–0.5)
Eosinophils Relative: 1 %
HCT: 37.7 % (ref 36.0–46.0)
Hemoglobin: 12.1 g/dL (ref 12.0–15.0)
Immature Granulocytes: 0 %
Lymphocytes Relative: 36 %
Lymphs Abs: 1.1 10*3/uL (ref 0.7–4.0)
MCH: 31.6 pg (ref 26.0–34.0)
MCHC: 32.1 g/dL (ref 30.0–36.0)
MCV: 98.4 fL (ref 80.0–100.0)
Monocytes Absolute: 0.2 10*3/uL (ref 0.1–1.0)
Monocytes Relative: 8 %
Neutro Abs: 1.6 10*3/uL — ABNORMAL LOW (ref 1.7–7.7)
Neutrophils Relative %: 54 %
Platelets: 140 10*3/uL — ABNORMAL LOW (ref 150–400)
RBC: 3.83 MIL/uL — ABNORMAL LOW (ref 3.87–5.11)
RDW: 13.5 % (ref 11.5–15.5)
WBC: 3 10*3/uL — ABNORMAL LOW (ref 4.0–10.5)
nRBC: 0 % (ref 0.0–0.2)

## 2022-04-07 NOTE — Progress Notes (Signed)
? ?[facility] ?OFFICE PROGRESS NOTE ? ?Heather Sailors, PA ? ?ASSESSMENT & PLAN:  ?Chronic ITP (idiopathic thrombocytopenia) (HCC) ?She has slight mild worsening pancytopenia but overall not symptomatic ?I am not enthusiastic to change the dose of her current maintenance prednisone ?I plan to see her again in a few months for further follow-up ? ?Chronic leukopenia ?She is not symptomatic ?Observe for now ? ?No orders of the defined types were placed in this encounter. ? ? ?The total time spent in the appointment was 20 minutes encounter with patients including review of chart and various tests results, discussions about plan of care and coordination of care plan ? ? All questions were answered. The patient knows to call the clinic with any problems, questions or concerns. No barriers to learning was detected. ? ? ? Heather Lark, MD ?4/14/202310:52 AM ? ?INTERVAL HISTORY: ?Heather [name] 61 y.o. female returns for follow-up for history of chronic ITP and mild leukopenia ?She is here accompanied by her caregiver ?She is only maintained on 1 dose of prednisone once a week ?She has no documented infection or bleeding recently ?She is very active with community programs ?It is noted that the patient has gained a lot of weight since last time I saw her due to reduced physical activity ? ?SUMMARY OF HEMATOLOGIC HISTORY: ? ?Heather [name] is here because of thrombocytopenia. ?She has mild mental deficit.  ?Her background history is not clear. She lives with Heather [name] for 4 years.  ?She was found to have abnormal CBC from recent blood work. ?She has normal CBC as of last year. ?Most recently, repeat CBC show mild leukopenia with a platelet count of 89,000.  That was subsequently repeated and it got even lower to 69,000. ?She had discovered recent bruising but denies spontaneous epistaxis, hematuria, melena or hematochezia ?The patient denies history of liver disease, exposure to heparin, history of  cardiac murmur/prior cardiovascular surgery or recent new medications ?She had received blood transfusion in the past. ?Although the patient denies a history of liver disease, ultrasound of the abdomen from April 2012 revealed fatty infiltration of the liver, suspicious for possible fatty liver disease versus other liver condition. ?She denies recent new medications.  She denies recent infection. ?Further workup revealed evidence of liver lesion on CT imaging and positive ANA screen. ?She was being observed from the hematology standpoint to progression of pancytopenia ?On 09/09/2018, she was started on 40 mg of prednisone daily ?On October 1st, 2019, the dose of prednisone is reduced to 20 mg daily ?On October 01, 2018, dose of prednisone is reduced to 10 mg daily ?On October 08, 2018, the dose of prednisone is reduced to 5 mg ?Bone density scan in November 2019 showed osteopenia with T score of -2.4 ?On March 06, 2019, the dose of prednisone is reduced to 2.5 mg daily ?On March 24, 2019, the dose of prednisone is adjusted to 2.5 mg alternate with 5 mg every other day ?Starting in February, dose of prednisone is reduced to 2.5 mg daily ?Starting 03/18/2020, prednisone dose is reduced to every other day at 2.5 mg and then twice a week.  She was found to have vitamin B12 deficiency and started taking oral vitamin B12 supplementation ?On 03/17/2021, she underwent bone marrow aspirate and biopsy which showed mild hypocellular bone marrow without dysplasia ?In May 2022, she received 4 doses of rituximab and is maintained on 2.5 mg prednisone twice a week ?Starting late 2022, she is maintained on only  2.5 mg of prednisone once a week ? ?I have reviewed the past medical history, past surgical history, social history and family history with the patient and they are unchanged from previous note. ? ?ALLERGIES:  has No Known Allergies. ? ?MEDICATIONS:  ?Current Outpatient Medications  ?Medication Sig Dispense Refill  ?  cholecalciferol (VITAMIN D3) 25 MCG (1000 UNIT) tablet Take 2,000 Units by mouth daily.    ? desvenlafaxine (PRISTIQ) 50 MG 24 hr tablet Take 1 tablet (50 mg total) by mouth daily. 30 tablet 0  ? meloxicam (MOBIC) 7.5 MG tablet Take 15 mg by mouth daily.    ? MYRBETRIQ 25 MG TB24 tablet Take 25 mg by mouth daily.    ? predniSONE (DELTASONE) 2.5 MG tablet Take 2.5 mg once a week by mouth 30 tablet 9  ? traMADol (ULTRAM) 50 MG tablet Take 1 tablet (50 mg total) by mouth every 6 (six) hours as needed. 15 tablet 0  ? triamcinolone cream (KENALOG) 0.1 % Apply thin layer to affected area TID PRN itching 30 g 0  ? vitamin B-12 (CYANOCOBALAMIN) 1000 MCG tablet Take 1,000 mcg by mouth daily.    ? ?No current facility-administered medications for this visit.  ?  ? ?REVIEW OF SYSTEMS:   ?Constitutional: Denies fevers, chills or night sweats ?Eyes: Denies blurriness of vision ?Ears, nose, mouth, throat, and face: Denies mucositis or sore throat ?Respiratory: Denies cough, dyspnea or wheezes ?Cardiovascular: Denies palpitation, chest discomfort or lower extremity swelling ?Gastrointestinal:  Denies nausea, heartburn or change in bowel habits ?Skin: Denies abnormal skin rashes ?Lymphatics: Denies new lymphadenopathy or easy bruising ?Neurological:Denies numbness, tingling or new weaknesses ?Behavioral/Psych: Mood is stable, no new changes  ?All other systems were reviewed with the patient and are negative. ? ?PHYSICAL EXAMINATION: ?ECOG PERFORMANCE STATUS: 1 - Symptomatic but completely ambulatory ? ?Vitals:  ? 04/07/22 0914  ?BP: (!) 112/52  ?Pulse: 85  ?Resp: 16  ?Temp: 98.3 ?F (36.8 ?C)  ?SpO2: 100%  ? ?Filed Weights  ? 04/07/22 0914  ?Weight: 132 lb 12.8 oz (60.2 kg)  ? ? ?GENERAL:alert, no distress and comfortable ?NEURO: alert & oriented x 3 with fluent speech, no focal motor/sensory deficits ? ?LABORATORY DATA:  ?I have reviewed the data as listed ? ?   ?Component Value Date/Time  ? NA 140 04/07/2022 0854  ? NA 143  01/03/2018 1108  ? K 4.0 04/07/2022 0854  ? CL 105 04/07/2022 0854  ? CO2 31 04/07/2022 0854  ? GLUCOSE 73 04/07/2022 0854  ? BUN 22 04/07/2022 0854  ? BUN 19 01/03/2018 1108  ? CREATININE 0.92 04/07/2022 0854  ? CREATININE 1.01 (H) 09/09/2018 0913  ? CREATININE 0.85 08/22/2014 1031  ? CALCIUM 9.7 04/07/2022 0854  ? PROT 6.8 04/07/2022 0854  ? PROT 6.2 01/03/2018 1108  ? ALBUMIN 4.0 04/07/2022 0854  ? ALBUMIN 3.9 01/03/2018 1108  ? AST 17 04/07/2022 0854  ? AST 15 04/18/2018 1110  ? ALT 5 04/07/2022 0854  ? ALT <6 04/18/2018 1110  ? ALKPHOS 96 04/07/2022 0854  ? BILITOT 0.5 04/07/2022 0854  ? BILITOT 0.3 04/18/2018 1110  ? GFRNONAA >60 04/07/2022 0854  ? GFRNONAA 60 (L) 09/09/2018 0913  ? GFRAA >60 10/15/2018 0823  ? GFRAA >60 09/09/2018 0913  ? ? ?No results found for: SPEP, UPEP ? ?Lab Results  ?Component Value Date  ? WBC 3.0 (L) 04/07/2022  ? NEUTROABS 1.6 (L) 04/07/2022  ? HGB 12.1 04/07/2022  ? HCT 37.7 04/07/2022  ? MCV 98.4  04/07/2022  ? PLT 140 (L) 04/07/2022  ? ? ?  Chemistry   ?   ?Component Value Date/Time  ? NA 140 04/07/2022 0854  ? NA 143 01/03/2018 1108  ? K 4.0 04/07/2022 0854  ? CL 105 04/07/2022 0854  ? CO2 31 04/07/2022 0854  ? BUN 22 04/07/2022 0854  ? BUN 19 01/03/2018 1108  ? CREATININE 0.92 04/07/2022 0854  ? CREATININE 1.01 (H) 09/09/2018 0913  ? CREATININE 0.85 08/22/2014 1031  ?    ?Component Value Date/Time  ? CALCIUM 9.7 04/07/2022 0854  ? ALKPHOS 96 04/07/2022 0854  ? AST 17 04/07/2022 0854  ? AST 15 04/18/2018 1110  ? ALT 5 04/07/2022 0854  ? ALT <6 04/18/2018 1110  ? BILITOT 0.5 04/07/2022 0854  ? BILITOT 0.3 04/18/2018 1110  ?  ? ?

## 2022-04-07 NOTE — Telephone Encounter (Signed)
-----   Message from [name], MD sent at 04/07/2022 10:53 AM EDT ----- ?Her caregiver, beverly usually wants a copy of my notes ?Please send/mail to her ? ?

## 2022-04-07 NOTE — Assessment & Plan Note (Signed)
She is not symptomatic. Observe for now 

## 2022-04-07 NOTE — Telephone Encounter (Signed)
Mailed office note to [name] the caregiver to address provided. ?

## 2022-04-07 NOTE — Assessment & Plan Note (Signed)
She has slight mild worsening pancytopenia but overall not symptomatic ?I am not enthusiastic to change the dose of her current maintenance prednisone ?I plan to see her again in a few months for further follow-up ?

## 2022-04-13 ENCOUNTER — Other Ambulatory Visit: Payer: Self-pay | Admitting: Physician Assistant

## 2022-04-13 DIAGNOSIS — Z1231 Encounter for screening mammogram for malignant neoplasm of breast: Secondary | ICD-10-CM

## 2022-04-27 ENCOUNTER — Ambulatory Visit: Payer: Medicare Other

## 2022-05-04 ENCOUNTER — Ambulatory Visit
Admission: RE | Admit: 2022-05-04 | Discharge: 2022-05-04 | Disposition: A | Discharge status: Home / Self Care | Payer: Medicare Other | Source: Ambulatory Visit | Attending: Physician Assistant | Admitting: Physician Assistant

## 2022-05-04 DIAGNOSIS — Z1231 Encounter for screening mammogram for malignant neoplasm of breast: Secondary | ICD-10-CM | POA: Diagnosis not present

## 2022-06-07 DIAGNOSIS — R058 Other specified cough: Secondary | ICD-10-CM | POA: Diagnosis not present

## 2022-06-22 DIAGNOSIS — R058 Other specified cough: Secondary | ICD-10-CM | POA: Diagnosis not present

## 2022-08-04 DIAGNOSIS — Z0000 Encounter for general adult medical examination without abnormal findings: Secondary | ICD-10-CM | POA: Diagnosis not present

## 2022-08-04 DIAGNOSIS — N62 Hypertrophy of breast: Secondary | ICD-10-CM | POA: Diagnosis not present

## 2022-08-04 DIAGNOSIS — E782 Mixed hyperlipidemia: Secondary | ICD-10-CM | POA: Diagnosis not present

## 2022-08-04 DIAGNOSIS — M79605 Pain in left leg: Secondary | ICD-10-CM | POA: Diagnosis not present

## 2022-08-04 DIAGNOSIS — M4186 Other forms of scoliosis, lumbar region: Secondary | ICD-10-CM | POA: Diagnosis not present

## 2022-08-04 DIAGNOSIS — D696 Thrombocytopenia, unspecified: Secondary | ICD-10-CM | POA: Diagnosis not present

## 2022-08-04 DIAGNOSIS — R251 Tremor, unspecified: Secondary | ICD-10-CM | POA: Diagnosis not present

## 2022-08-04 DIAGNOSIS — I69319 Unspecified symptoms and signs involving cognitive functions following cerebral infarction: Secondary | ICD-10-CM | POA: Diagnosis not present

## 2022-08-22 DIAGNOSIS — M47816 Spondylosis without myelopathy or radiculopathy, lumbar region: Secondary | ICD-10-CM | POA: Diagnosis not present

## 2022-08-22 DIAGNOSIS — I69319 Unspecified symptoms and signs involving cognitive functions following cerebral infarction: Secondary | ICD-10-CM | POA: Diagnosis not present

## 2022-09-08 ENCOUNTER — Ambulatory Visit (INDEPENDENT_AMBULATORY_CARE_PROVIDER_SITE_OTHER): Payer: Medicare Other | Admitting: Plastic Surgery

## 2022-09-08 ENCOUNTER — Encounter: Payer: Self-pay | Admitting: Plastic Surgery

## 2022-09-08 VITALS — BP 105/66 | HR 73 | Ht 59.0 in | Wt 133.4 lb

## 2022-09-08 DIAGNOSIS — G8929 Other chronic pain: Secondary | ICD-10-CM

## 2022-09-08 DIAGNOSIS — M549 Dorsalgia, unspecified: Secondary | ICD-10-CM | POA: Insufficient documentation

## 2022-09-08 DIAGNOSIS — M4180 Other forms of scoliosis, site unspecified: Secondary | ICD-10-CM | POA: Diagnosis not present

## 2022-09-08 DIAGNOSIS — D696 Thrombocytopenia, unspecified: Secondary | ICD-10-CM

## 2022-09-08 DIAGNOSIS — Z6826 Body mass index (BMI) 26.0-26.9, adult: Secondary | ICD-10-CM

## 2022-09-08 DIAGNOSIS — M542 Cervicalgia: Secondary | ICD-10-CM

## 2022-09-08 DIAGNOSIS — M546 Pain in thoracic spine: Secondary | ICD-10-CM

## 2022-09-08 DIAGNOSIS — N62 Hypertrophy of breast: Secondary | ICD-10-CM | POA: Insufficient documentation

## 2022-09-08 NOTE — Progress Notes (Signed)
Patient ID: Heather [name], female    DOB: 1961-06-13, 61 y.o.   MRN: [id number]   Chief Complaint  Patient presents with   Advice Only   Breast Problem    Mammary Hyperplasia: The patient is a 61 y.o. female with a history of mammary hyperplasia for several years.  She has extremely large breasts causing symptoms that include the following: Back pain in the upper and lower back, including neck pain. She pulls or pins her bra straps to provide better lift and relief of the pressure and pain. She notices relief by holding her breast up manually.  Her shoulder straps cause grooves and pain and pressure that requires padding for relief. Pain medication is sometimes required with motrin and tylenol.  Activities that are hindered by enlarged breasts include: exercise and running.  She has tried supportive clothing as well as fitted bras without improvement.  Her breasts are extremely large and fairly symmetric.  She has hyperpigmentation of the inframammary area on both sides.  The sternal to nipple distance on the right is 28 cm and the left is 27 cm.  The IMF distance is 17 cm.  She is 4 feet 11 inches tall and weighs 133 pounds.  The BMI = 26.9 kg/m.  Preoperative bra size = 38 D cup.  The estimated excess breast tissue to be removed at the time of surgery = 260 grams on the left and 260 grams on the right.  Mammogram history: May 2023 negative.  Family history of breast cancer:  no.  Tobacco use:  no.   The patient expresses the desire to pursue surgical intervention.  Patient has developmental delay and severe scoliosis.  She also has a right lower extremity Raad secondary to a fracture.  She lives with her mother's friend since her mother passed away.  She is currently on prednisone according to the friend for issues with her hemoglobin.  She will be seeing the hematologist again in the next month or so.  She also has arthritis and idiopathic thrombocytopenia and the rod is for a right knee  effusion.     Review of Systems  Constitutional:  Positive for activity change. Negative for appetite change.  Eyes: Negative.   Respiratory:  Negative for chest tightness and shortness of breath.   Cardiovascular: Negative.   Gastrointestinal: Negative.   Endocrine: Negative.   Genitourinary: Negative.   Musculoskeletal:  Positive for back pain.  Skin: Negative.   Neurological:  Positive for headaches.  Hematological:  Bruises/bleeds easily.  Psychiatric/Behavioral: Negative.      Past Medical History:  Diagnosis Date   Allergy    Anxiety    history restored after error with record merge       Arthritis    history restored after error with record merge       Depression    Fatty infiltration of liver    history restored after error with record merge       Intestinal obstruction ([place])    as an infant history restored after error with record merge       Mental retardation    history restored after error with record merge        Past Surgical History:  Procedure Laterality Date   ABDOMINAL HYSTERECTOMY     COLONOSCOPY     fused knee     KNEE DISLOCATION SURGERY     POLYPECTOMY     SIGMOIDOSCOPY  Current Outpatient Medications:    predniSONE (DELTASONE) 2.5 MG tablet, Take 2.5 mg once a week by mouth, Disp: 30 tablet, Rfl: 9   albuterol (VENTOLIN HFA) 108 (90 Base) MCG/ACT inhaler, Inhale 2 puffs into the lungs every 6 (six) hours as needed., Disp: , Rfl:    cetirizine (ZYRTEC) 10 MG tablet, Take 10 mg by mouth daily., Disp: , Rfl:    cholecalciferol (VITAMIN D3) 25 MCG (1000 UNIT) tablet, Take 2,000 Units by mouth daily., Disp: , Rfl:    Objective:   Vitals:   09/08/22 1105  BP: 105/66  Pulse: 73  SpO2: 98%    Physical Exam Vitals reviewed.  HENT:     Head: Normocephalic and atraumatic.  Cardiovascular:     Rate and Rhythm: Normal rate.     Pulses: Normal pulses.  Pulmonary:     Effort: Pulmonary effort is normal.  Abdominal:     General:  There is no distension.     Palpations: Abdomen is soft.     Tenderness: There is no abdominal tenderness.  Musculoskeletal:        General: Deformity present. No swelling.  Skin:    General: Skin is warm.     Capillary Refill: Capillary refill takes less than 2 seconds.     Coloration: Skin is not jaundiced.  Neurological:     Mental Status: She is alert and oriented to person, place, and time.  Psychiatric:        Mood and Affect: Mood normal.        Behavior: Behavior normal.        Thought Content: Thought content normal.        Judgment: Judgment normal.     Assessment & Plan:  Thrombocytopenia ([place])  Other form of scoliosis, unspecified spinal region  Symptomatic mammary hypertrophy  Chronic bilateral thoracic back pain  The procedure the patient selected and that was best for the patient was discussed. The risk were discussed and include but not limited to the following:  Breast asymmetry, fluid accumulation, firmness of the breast, inability to breast feed, loss of nipple or areola, skin loss, change in skin and nipple sensation, fat necrosis of the breast tissue, bleeding, infection and healing delay.  There are risks of anesthesia and injury to nerves or blood vessels.  Allergic reaction to tape, suture and skin glue are possible.  There will be swelling.  Any of these can lead to the need for revisional surgery.  A breast reduction has potential to interfere with diagnostic procedures in the future.  This procedure is best done when the breast is fully developed.  Changes in the breast will continue to occur over time: pregnancy, weight gain or weigh loss.    Total time: 40 minutes. This includes time spent with the patient during the visit as well as time spent before and after the visit reviewing the chart, documenting the encounter, ordering pertinent studies and literature for the patient.    I have concerns about the patient's ability to undergo the surgery.  She would  need to be off the prednisone for a minimum of 6 weeks prior to surgery.  I also do not think that the breast reduction would help her scoliosis in any way.  I will need to see her back and evaluate the situation again prior to committing to surgery. If she has surgery she will need to spend one night after the surgery.  Pictures were obtained of the patient and placed in  the chart with the patient's or guardian's permission.   [place], DO

## 2022-09-13 ENCOUNTER — Other Ambulatory Visit: Payer: Self-pay | Admitting: Hematology and Oncology

## 2022-09-15 DIAGNOSIS — I69319 Unspecified symptoms and signs involving cognitive functions following cerebral infarction: Secondary | ICD-10-CM | POA: Diagnosis not present

## 2022-09-15 DIAGNOSIS — N62 Hypertrophy of breast: Secondary | ICD-10-CM | POA: Diagnosis not present

## 2022-09-15 DIAGNOSIS — E782 Mixed hyperlipidemia: Secondary | ICD-10-CM | POA: Diagnosis not present

## 2022-09-15 DIAGNOSIS — M79605 Pain in left leg: Secondary | ICD-10-CM | POA: Diagnosis not present

## 2022-09-15 DIAGNOSIS — M4186 Other forms of scoliosis, lumbar region: Secondary | ICD-10-CM | POA: Diagnosis not present

## 2022-09-15 DIAGNOSIS — R251 Tremor, unspecified: Secondary | ICD-10-CM | POA: Diagnosis not present

## 2022-09-15 DIAGNOSIS — D696 Thrombocytopenia, unspecified: Secondary | ICD-10-CM | POA: Diagnosis not present

## 2022-10-05 ENCOUNTER — Other Ambulatory Visit: Payer: Self-pay

## 2022-10-05 ENCOUNTER — Inpatient Hospital Stay: Payer: Medicare Other | Attending: Hematology and Oncology

## 2022-10-05 ENCOUNTER — Inpatient Hospital Stay (HOSPITAL_BASED_OUTPATIENT_CLINIC_OR_DEPARTMENT_OTHER): Payer: Medicare Other | Admitting: Hematology and Oncology

## 2022-10-05 ENCOUNTER — Encounter: Payer: Self-pay | Admitting: Hematology and Oncology

## 2022-10-05 VITALS — BP 106/56 | HR 78 | Temp 97.4°F | Resp 18 | Ht 59.0 in | Wt 135.4 lb

## 2022-10-05 DIAGNOSIS — Z79899 Other long term (current) drug therapy: Secondary | ICD-10-CM | POA: Insufficient documentation

## 2022-10-05 DIAGNOSIS — D693 Immune thrombocytopenic purpura: Secondary | ICD-10-CM

## 2022-10-05 DIAGNOSIS — Z7952 Long term (current) use of systemic steroids: Secondary | ICD-10-CM | POA: Diagnosis not present

## 2022-10-05 DIAGNOSIS — E538 Deficiency of other specified B group vitamins: Secondary | ICD-10-CM

## 2022-10-05 DIAGNOSIS — D61818 Other pancytopenia: Secondary | ICD-10-CM

## 2022-10-05 DIAGNOSIS — M8580 Other specified disorders of bone density and structure, unspecified site: Secondary | ICD-10-CM | POA: Diagnosis not present

## 2022-10-05 LAB — CBC WITH DIFFERENTIAL/PLATELET
Abs Immature Granulocytes: 0.02 10*3/uL (ref 0.00–0.07)
Basophils Absolute: 0 10*3/uL (ref 0.0–0.1)
Basophils Relative: 0 %
Eosinophils Absolute: 0.1 10*3/uL (ref 0.0–0.5)
Eosinophils Relative: 1 %
HCT: 37.5 % (ref 36.0–46.0)
Hemoglobin: 12.3 g/dL (ref 12.0–15.0)
Immature Granulocytes: 0 %
Lymphocytes Relative: 20 %
Lymphs Abs: 1.4 10*3/uL (ref 0.7–4.0)
MCH: 31.9 pg (ref 26.0–34.0)
MCHC: 32.8 g/dL (ref 30.0–36.0)
MCV: 97.4 fL (ref 80.0–100.0)
Monocytes Absolute: 0.4 10*3/uL (ref 0.1–1.0)
Monocytes Relative: 6 %
Neutro Abs: 5 10*3/uL (ref 1.7–7.7)
Neutrophils Relative %: 73 %
Platelets: 164 10*3/uL (ref 150–400)
RBC: 3.85 MIL/uL — ABNORMAL LOW (ref 3.87–5.11)
RDW: 13.6 % (ref 11.5–15.5)
WBC: 6.8 10*3/uL (ref 4.0–10.5)
nRBC: 0 % (ref 0.0–0.2)

## 2022-10-05 LAB — COMPREHENSIVE METABOLIC PANEL
ALT: <5 U/L (ref 0–44)
AST: 15 U/L (ref 15–41)
Albumin: 4 g/dL (ref 3.5–5.0)
Alkaline Phosphatase: 115 U/L (ref 38–126)
Anion gap: 4 — ABNORMAL LOW (ref 5–15)
BUN: 22 mg/dL (ref 8–23)
CO2: 30 mmol/L (ref 22–32)
Calcium: 9.6 mg/dL (ref 8.9–10.3)
Chloride: 106 mmol/L (ref 98–111)
Creatinine, Ser: 0.87 mg/dL (ref 0.44–1.00)
GFR, Estimated: >60 mL/min (ref >60)
Glucose, Bld: 84 mg/dL (ref 70–99)
Potassium: 4.3 mmol/L (ref 3.5–5.1)
Sodium: 140 mmol/L (ref 135–145)
Total Bilirubin: 0.6 mg/dL (ref 0.3–1.2)
Total Protein: 6.8 g/dL (ref 6.5–8.1)

## 2022-10-05 NOTE — Progress Notes (Signed)
[facility] OFFICE PROGRESS NOTE  [name], [place]  ASSESSMENT & PLAN:  Chronic ITP (idiopathic thrombocytopenia) (HCC) Her pancytopenia has resolved She is only taking prednisone once a week I recommend discontinuation of prednisone and we will bring her back in a month for observation There is no contraindication for her to proceed with surgery if needed  No orders of the defined types were placed in this encounter.   The total time spent in the appointment was 20 minutes encounter with patients including review of chart and various tests results, discussions about plan of care and coordination of care plan   All questions were answered. The patient knows to call the clinic with any problems, questions or concerns. No barriers to learning was detected.    [name], MD 10/12/20239:48 AM  INTERVAL HISTORY: Heather [name] 61 y.o. female returns for with caregiver for history of pancytopenia/ITP She has no recent relapse of ITP No recent bleeding She has chronic back pain and had pendulous breasts, desire breast reduction surgery in the future Her surgeon requested the patient to, prednisone prior to surgery  SUMMARY OF HEMATOLOGIC HISTORY:  Heather [name] is here because of thrombocytopenia. She has mild mental deficit.  Her background history is not clear. She lives with Heather [name] for 4 years.  She was found to have abnormal CBC from recent blood work. She has normal CBC as of last year. Most recently, repeat CBC show mild leukopenia with a platelet count of 89,000.  That was subsequently repeated and it got even lower to 69,000. She had discovered recent bruising but denies spontaneous epistaxis, hematuria, melena or hematochezia The patient denies history of liver disease, exposure to heparin, history of cardiac murmur/prior cardiovascular surgery or recent new medications She had received blood transfusion in the past. Although the patient  denies a history of liver disease, ultrasound of the abdomen from April 2012 revealed fatty infiltration of the liver, suspicious for possible fatty liver disease versus other liver condition. She denies recent new medications.  She denies recent infection. Further workup revealed evidence of liver lesion on CT imaging and positive ANA screen. She was being observed from the hematology standpoint to progression of pancytopenia On 09/09/2018, she was started on 40 mg of prednisone daily On October 1st, 2019, the dose of prednisone is reduced to 20 mg daily On October 01, 2018, dose of prednisone is reduced to 10 mg daily On October 08, 2018, the dose of prednisone is reduced to 5 mg Bone density scan in November 2019 showed osteopenia with T score of -2.4 On March 06, 2019, the dose of prednisone is reduced to 2.5 mg daily On March 24, 2019, the dose of prednisone is adjusted to 2.5 mg alternate with 5 mg every other day Starting in February, dose of prednisone is reduced to 2.5 mg daily Starting 03/18/2020, prednisone dose is reduced to every other day at 2.5 mg and then twice a week.  She was found to have vitamin B12 deficiency and started taking oral vitamin B12 supplementation On 03/17/2021, she underwent bone marrow aspirate and biopsy which showed mild hypocellular bone marrow without dysplasia In May 2022, she received 4 doses of rituximab and is maintained on 2.5 mg prednisone twice a week Starting late 2022, she is maintained on only 2.5 mg of prednisone once a week October 05, 2022, decision is made to stop prednisone completely  I have reviewed the past medical history, past surgical history, social history  and family history with the patient and they are unchanged from previous note.  ALLERGIES:  has No Known Allergies.  MEDICATIONS:  Current Outpatient Medications  Medication Sig Dispense Refill   albuterol (VENTOLIN HFA) 108 (90 Base) MCG/ACT inhaler Inhale 2 puffs into the lungs  every 6 (six) hours as needed.     cetirizine (ZYRTEC) 10 MG tablet Take 10 mg by mouth daily.     cholecalciferol (VITAMIN D3) 25 MCG (1000 UNIT) tablet Take 2,000 Units by mouth daily.     No current facility-administered medications for this visit.     REVIEW OF SYSTEMS:   Constitutional: Denies fevers, chills or night sweats Eyes: Denies blurriness of vision Ears, nose, mouth, throat, and face: Denies mucositis or sore throat Respiratory: Denies cough, dyspnea or wheezes Cardiovascular: Denies palpitation, chest discomfort or lower extremity swelling Gastrointestinal:  Denies nausea, heartburn or change in bowel habits Skin: Denies abnormal skin rashes Lymphatics: Denies new lymphadenopathy or easy bruising Neurological:Denies numbness, tingling or new weaknesses Behavioral/Psych: Mood is stable, no new changes  All other systems were reviewed with the patient and are negative.  PHYSICAL EXAMINATION: ECOG PERFORMANCE STATUS: 0 - Asymptomatic  Vitals:   10/05/22 0918  BP: (!) 106/56  Pulse: 78  Resp: 18  Temp: (!) 97.4 F (36.3 C)  SpO2: 100%   Filed Weights   10/05/22 0918  Weight: 135 lb 6.4 oz (61.4 kg)    GENERAL:alert, no distress and comfortable NEURO: alert & oriented x 3 with fluent speech, no focal motor/sensory deficits  LABORATORY DATA:  I have reviewed the data as listed     Component Value Date/Time   NA 140 10/05/2022 0905   NA 143 01/03/2018 1108   K 4.3 10/05/2022 0905   CL 106 10/05/2022 0905   CO2 30 10/05/2022 0905   GLUCOSE 84 10/05/2022 0905   BUN 22 10/05/2022 0905   BUN 19 01/03/2018 1108   CREATININE 0.87 10/05/2022 0905   CREATININE 1.01 (H) 09/09/2018 0913   CREATININE 0.85 08/22/2014 1031   CALCIUM 9.6 10/05/2022 0905   PROT 6.8 10/05/2022 0905   PROT 6.2 01/03/2018 1108   ALBUMIN 4.0 10/05/2022 0905   ALBUMIN 3.9 01/03/2018 1108   AST 15 10/05/2022 0905   AST 15 04/18/2018 1110   ALT PENDING 10/05/2022 0905   ALT <6  04/18/2018 1110   ALKPHOS 115 10/05/2022 0905   BILITOT 0.6 10/05/2022 0905   BILITOT 0.3 04/18/2018 1110   GFRNONAA >60 10/05/2022 0905   GFRNONAA 60 (L) 09/09/2018 0913   GFRAA >60 10/15/2018 0823   GFRAA >60 09/09/2018 0913    No results found for: "SPEP", "UPEP"  Lab Results  Component Value Date   WBC 6.8 10/05/2022   NEUTROABS 5.0 10/05/2022   HGB 12.3 10/05/2022   HCT 37.5 10/05/2022   MCV 97.4 10/05/2022   PLT 164 10/05/2022      Chemistry      Component Value Date/Time   NA 140 10/05/2022 0905   NA 143 01/03/2018 1108   K 4.3 10/05/2022 0905   CL 106 10/05/2022 0905   CO2 30 10/05/2022 0905   BUN 22 10/05/2022 0905   BUN 19 01/03/2018 1108   CREATININE 0.87 10/05/2022 0905   CREATININE 1.01 (H) 09/09/2018 0913   CREATININE 0.85 08/22/2014 1031      Component Value Date/Time   CALCIUM 9.6 10/05/2022 0905   ALKPHOS 115 10/05/2022 0905   AST 15 10/05/2022 0905   AST 15 04/18/2018 1110  ALT PENDING 10/05/2022 0905   ALT <6 04/18/2018 1110   BILITOT 0.6 10/05/2022 0905   BILITOT 0.3 04/18/2018 1110

## 2022-10-05 NOTE — Assessment & Plan Note (Signed)
Her pancytopenia has resolved She is only taking prednisone once a week I recommend discontinuation of prednisone and we will bring her back in a month for observation There is no contraindication for her to proceed with surgery if needed

## 2022-10-06 ENCOUNTER — Telehealth: Payer: Self-pay

## 2022-10-06 NOTE — Telephone Encounter (Signed)
-----   Message from [name], MD sent at 10/05/2022  5:53 PM EDT ----- Pls mail [place] a copy of my notes

## 2022-10-06 NOTE — Telephone Encounter (Signed)
Mail out office notes to Joy who is caregiver.

## 2022-11-09 ENCOUNTER — Encounter: Payer: Self-pay | Admitting: Hematology and Oncology

## 2022-11-09 ENCOUNTER — Inpatient Hospital Stay (HOSPITAL_BASED_OUTPATIENT_CLINIC_OR_DEPARTMENT_OTHER): Payer: Medicare Other | Admitting: Hematology and Oncology

## 2022-11-09 ENCOUNTER — Telehealth: Payer: Self-pay

## 2022-11-09 ENCOUNTER — Ambulatory Visit: Payer: Medicare Other | Admitting: Student

## 2022-11-09 ENCOUNTER — Ambulatory Visit: Payer: Medicare Other | Admitting: Hematology and Oncology

## 2022-11-09 ENCOUNTER — Other Ambulatory Visit: Payer: Medicare Other

## 2022-11-09 ENCOUNTER — Other Ambulatory Visit: Payer: Self-pay

## 2022-11-09 ENCOUNTER — Inpatient Hospital Stay: Payer: Medicare Other | Attending: Hematology and Oncology

## 2022-11-09 VITALS — BP 111/47 | HR 83 | Temp 98.1°F | Resp 18 | Ht 59.0 in | Wt 134.0 lb

## 2022-11-09 DIAGNOSIS — D61818 Other pancytopenia: Secondary | ICD-10-CM

## 2022-11-09 DIAGNOSIS — N62 Hypertrophy of breast: Secondary | ICD-10-CM | POA: Diagnosis not present

## 2022-11-09 DIAGNOSIS — D693 Immune thrombocytopenic purpura: Secondary | ICD-10-CM

## 2022-11-09 DIAGNOSIS — E782 Mixed hyperlipidemia: Secondary | ICD-10-CM | POA: Diagnosis not present

## 2022-11-09 DIAGNOSIS — R251 Tremor, unspecified: Secondary | ICD-10-CM | POA: Diagnosis not present

## 2022-11-09 DIAGNOSIS — E538 Deficiency of other specified B group vitamins: Secondary | ICD-10-CM

## 2022-11-09 DIAGNOSIS — M79605 Pain in left leg: Secondary | ICD-10-CM | POA: Diagnosis not present

## 2022-11-09 DIAGNOSIS — M4186 Other forms of scoliosis, lumbar region: Secondary | ICD-10-CM | POA: Diagnosis not present

## 2022-11-09 DIAGNOSIS — I69319 Unspecified symptoms and signs involving cognitive functions following cerebral infarction: Secondary | ICD-10-CM | POA: Diagnosis not present

## 2022-11-09 DIAGNOSIS — D696 Thrombocytopenia, unspecified: Secondary | ICD-10-CM | POA: Diagnosis not present

## 2022-11-09 LAB — CBC WITH DIFFERENTIAL/PLATELET
Abs Immature Granulocytes: 0.01 10*3/uL (ref 0.00–0.07)
Basophils Absolute: 0 10*3/uL (ref 0.0–0.1)
Basophils Relative: 1 %
Eosinophils Absolute: 0 10*3/uL (ref 0.0–0.5)
Eosinophils Relative: 1 %
HCT: 36.3 % (ref 36.0–46.0)
Hemoglobin: 11.7 g/dL — ABNORMAL LOW (ref 12.0–15.0)
Immature Granulocytes: 0 %
Lymphocytes Relative: 35 %
Lymphs Abs: 1.1 10*3/uL (ref 0.7–4.0)
MCH: 31.8 pg (ref 26.0–34.0)
MCHC: 32.2 g/dL (ref 30.0–36.0)
MCV: 98.6 fL (ref 80.0–100.0)
Monocytes Absolute: 0.2 10*3/uL (ref 0.1–1.0)
Monocytes Relative: 8 %
Neutro Abs: 1.8 10*3/uL (ref 1.7–7.7)
Neutrophils Relative %: 55 %
Platelets: 174 10*3/uL (ref 150–400)
RBC: 3.68 MIL/uL — ABNORMAL LOW (ref 3.87–5.11)
RDW: 13.3 % (ref 11.5–15.5)
WBC: 3.2 10*3/uL — ABNORMAL LOW (ref 4.0–10.5)
nRBC: 0 % (ref 0.0–0.2)

## 2022-11-09 NOTE — Assessment & Plan Note (Signed)
Despite discontinuation of prednisone a month ago, her platelet count is higher She does not have any signs of ITP I plan to see her in 6 months for further follow-up There is no contraindication for her to proceed with surgery as indicated

## 2022-11-09 NOTE — Assessment & Plan Note (Signed)
She has mild, chronic intermittent leukopenia and reduced hemoglobin She does not need extensive work-up Observe only

## 2022-11-09 NOTE — Telephone Encounter (Signed)
-----   Message from [name], MD sent at 11/09/2022 12:25 PM EST ----- Pls mail a copy of my notes to [facility]

## 2022-11-09 NOTE — Progress Notes (Signed)
[facility] OFFICE PROGRESS NOTE  Heather [name], [place]  ASSESSMENT & PLAN:  Chronic ITP (idiopathic thrombocytopenia) (HCC) Despite discontinuation of prednisone a month ago, her platelet count is higher She does not have any signs of ITP I plan to see her in 6 months for further follow-up There is no contraindication for her to proceed with surgery as indicated  Pancytopenia, acquired ([place]) She has mild, chronic intermittent leukopenia and reduced hemoglobin She does not need extensive work-up Observe only  No orders of the defined types were placed in this encounter.   The total time spent in the appointment was 20 minutes encounter with patients including review of chart and various tests results, discussions about plan of care and coordination of care plan   All questions were answered. The patient knows to call the clinic with any problems, questions or concerns. No barriers to learning was detected.    Heather Lark, MD 11/16/202312:24 PM  INTERVAL HISTORY: Heather [name] 62 y.o. female returns for further follow-up Her caregiver is present She is doing well Her caregiver is hopeful she can proceed with bilateral mastectomy due to pendulous breasts causing back pain and difficulties with mobility  SUMMARY OF HEMATOLOGIC HISTORY:  Heather [name] is here because of thrombocytopenia. She has mild mental deficit.  Her background history is not clear. She lives with Heather [name] for 4 years.  She was found to have abnormal CBC from recent blood work. She has normal CBC as of last year. Most recently, repeat CBC show mild leukopenia with a platelet count of 89,000.  That was subsequently repeated and it got even lower to 69,000. She had discovered recent bruising but denies spontaneous epistaxis, hematuria, melena or hematochezia The patient denies history of liver disease, exposure to heparin, history of cardiac murmur/prior cardiovascular surgery or  recent new medications She had received blood transfusion in the past. Although the patient denies a history of liver disease, ultrasound of the abdomen from April 2012 revealed fatty infiltration of the liver, suspicious for possible fatty liver disease versus other liver condition. She denies recent new medications.  She denies recent infection. Further workup revealed evidence of liver lesion on CT imaging and positive ANA screen. She was being observed from the hematology standpoint to progression of pancytopenia On 09/09/2018, she was started on 40 mg of prednisone daily On October 1st, 2019, the dose of prednisone is reduced to 20 mg daily On October 01, 2018, dose of prednisone is reduced to 10 mg daily On October 08, 2018, the dose of prednisone is reduced to 5 mg Bone density scan in November 2019 showed osteopenia with T score of -2.4 On March 06, 2019, the dose of prednisone is reduced to 2.5 mg daily On March 24, 2019, the dose of prednisone is adjusted to 2.5 mg alternate with 5 mg every other day Starting in February, dose of prednisone is reduced to 2.5 mg daily Starting 03/18/2020, prednisone dose is reduced to every other day at 2.5 mg and then twice a week.  She was found to have vitamin B12 deficiency and started taking oral vitamin B12 supplementation On 03/17/2021, she underwent bone marrow aspirate and biopsy which showed mild hypocellular bone marrow without dysplasia In May 2022, she received 4 doses of rituximab and is maintained on 2.5 mg prednisone twice a week Starting late 2022, she is maintained on only 2.5 mg of prednisone once a week October 05, 2022, decision is made to stop prednisone completely  I have reviewed the past medical history, past surgical history, social history and family history with the patient and they are unchanged from previous note.  ALLERGIES:  has No Known Allergies.  MEDICATIONS:  Current Outpatient Medications  Medication Sig Dispense  Refill   albuterol (VENTOLIN HFA) 108 (90 Base) MCG/ACT inhaler Inhale 2 puffs into the lungs every 6 (six) hours as needed.     cetirizine (ZYRTEC) 10 MG tablet Take 10 mg by mouth daily.     cholecalciferol (VITAMIN D3) 25 MCG (1000 UNIT) tablet Take 2,000 Units by mouth daily.     No current facility-administered medications for this visit.     REVIEW OF SYSTEMS:   Constitutional: Denies fevers, chills or night sweats Eyes: Denies blurriness of vision Ears, nose, mouth, throat, and face: Denies mucositis or sore throat Respiratory: Denies cough, dyspnea or wheezes Cardiovascular: Denies palpitation, chest discomfort or lower extremity swelling Gastrointestinal:  Denies nausea, heartburn or change in bowel habits Skin: Denies abnormal skin rashes Lymphatics: Denies new lymphadenopathy or easy bruising Neurological:Denies numbness, tingling or new weaknesses Behavioral/Psych: Mood is stable, no new changes  All other systems were reviewed with the patient and are negative.  PHYSICAL EXAMINATION: ECOG PERFORMANCE STATUS: 1 - Symptomatic but completely ambulatory  Vitals:   11/09/22 1030  BP: (!) 111/47  Pulse: 83  Resp: 18  Temp: 98.1 F (36.7 C)  SpO2: 100%   Filed Weights   11/09/22 1030  Weight: 134 lb (60.8 kg)    GENERAL:alert, no distress and comfortable  NEURO: alert & oriented x 3 with fluent speech, no focal motor/sensory deficits  LABORATORY DATA:  I have reviewed the data as listed     Component Value Date/Time   NA 140 10/05/2022 0905   NA 143 01/03/2018 1108   K 4.3 10/05/2022 0905   CL 106 10/05/2022 0905   CO2 30 10/05/2022 0905   GLUCOSE 84 10/05/2022 0905   BUN 22 10/05/2022 0905   BUN 19 01/03/2018 1108   CREATININE 0.87 10/05/2022 0905   CREATININE 1.01 (H) 09/09/2018 0913   CREATININE 0.85 08/22/2014 1031   CALCIUM 9.6 10/05/2022 0905   PROT 6.8 10/05/2022 0905   PROT 6.2 01/03/2018 1108   ALBUMIN 4.0 10/05/2022 0905   ALBUMIN 3.9  01/03/2018 1108   AST 15 10/05/2022 0905   AST 15 04/18/2018 1110   ALT <5 10/05/2022 0905   ALT <6 04/18/2018 1110   ALKPHOS 115 10/05/2022 0905   BILITOT 0.6 10/05/2022 0905   BILITOT 0.3 04/18/2018 1110   GFRNONAA >60 10/05/2022 0905   GFRNONAA 60 (L) 09/09/2018 0913   GFRAA >60 10/15/2018 0823   GFRAA >60 09/09/2018 0913    No results found for: "SPEP", "UPEP"  Lab Results  Component Value Date   WBC 3.2 (L) 11/09/2022   NEUTROABS 1.8 11/09/2022   HGB 11.7 (L) 11/09/2022   HCT 36.3 11/09/2022   MCV 98.6 11/09/2022   PLT 174 11/09/2022      Chemistry      Component Value Date/Time   NA 140 10/05/2022 0905   NA 143 01/03/2018 1108   K 4.3 10/05/2022 0905   CL 106 10/05/2022 0905   CO2 30 10/05/2022 0905   BUN 22 10/05/2022 0905   BUN 19 01/03/2018 1108   CREATININE 0.87 10/05/2022 0905   CREATININE 1.01 (H) 09/09/2018 0913   CREATININE 0.85 08/22/2014 1031      Component Value Date/Time   CALCIUM 9.6 10/05/2022 0905   ALKPHOS  115 10/05/2022 0905   AST 15 10/05/2022 0905   AST 15 04/18/2018 1110   ALT <5 10/05/2022 0905   ALT <6 04/18/2018 1110   BILITOT 0.6 10/05/2022 0905   BILITOT 0.3 04/18/2018 1110

## 2022-11-09 NOTE — Telephone Encounter (Signed)
Mailed office note to [facility] [place] [facility] via mail.

## 2022-11-10 ENCOUNTER — Ambulatory Visit: Payer: Medicare Other | Admitting: Physician Assistant

## 2022-11-28 ENCOUNTER — Institutional Professional Consult (permissible substitution): Payer: Medicare Other | Admitting: Plastic Surgery

## 2022-12-07 DIAGNOSIS — E559 Vitamin D deficiency, unspecified: Secondary | ICD-10-CM | POA: Diagnosis not present

## 2022-12-07 DIAGNOSIS — Z0001 Encounter for general adult medical examination with abnormal findings: Secondary | ICD-10-CM | POA: Diagnosis not present

## 2022-12-07 DIAGNOSIS — I69319 Unspecified symptoms and signs involving cognitive functions following cerebral infarction: Secondary | ICD-10-CM | POA: Diagnosis not present

## 2022-12-07 DIAGNOSIS — M79605 Pain in left leg: Secondary | ICD-10-CM | POA: Diagnosis not present

## 2022-12-07 DIAGNOSIS — Z23 Encounter for immunization: Secondary | ICD-10-CM | POA: Diagnosis not present

## 2022-12-07 DIAGNOSIS — D696 Thrombocytopenia, unspecified: Secondary | ICD-10-CM | POA: Diagnosis not present

## 2022-12-07 DIAGNOSIS — M4186 Other forms of scoliosis, lumbar region: Secondary | ICD-10-CM | POA: Diagnosis not present

## 2022-12-07 DIAGNOSIS — E782 Mixed hyperlipidemia: Secondary | ICD-10-CM | POA: Diagnosis not present

## 2022-12-07 DIAGNOSIS — R251 Tremor, unspecified: Secondary | ICD-10-CM | POA: Diagnosis not present

## 2022-12-07 DIAGNOSIS — N62 Hypertrophy of breast: Secondary | ICD-10-CM | POA: Diagnosis not present

## 2022-12-29 ENCOUNTER — Ambulatory Visit (INDEPENDENT_AMBULATORY_CARE_PROVIDER_SITE_OTHER): Payer: Medicare Other | Admitting: Plastic Surgery

## 2022-12-29 ENCOUNTER — Encounter: Payer: Self-pay | Admitting: Plastic Surgery

## 2022-12-29 VITALS — BP 115/79 | HR 84

## 2022-12-29 DIAGNOSIS — R21 Rash and other nonspecific skin eruption: Secondary | ICD-10-CM

## 2022-12-29 DIAGNOSIS — N62 Hypertrophy of breast: Secondary | ICD-10-CM | POA: Diagnosis not present

## 2022-12-29 DIAGNOSIS — R2689 Other abnormalities of gait and mobility: Secondary | ICD-10-CM | POA: Diagnosis not present

## 2022-12-29 DIAGNOSIS — M542 Cervicalgia: Secondary | ICD-10-CM

## 2022-12-29 NOTE — Addendum Note (Signed)
Addended by: Tresa Moore on: 12/29/2022 03:26 PM   Modules accepted: Orders

## 2022-12-29 NOTE — Progress Notes (Signed)
   Subjective:    Patient ID: Heather [name], female    DOB: [birth year], 62 y.o.   MRN: [id number]  The patient is a 62 year old female here with her medical power of attorney/family member.  They are here for a reevaluation of her large breasts.  The thought process has now changed to the patient wanting bilateral mastectomies.  She states that 2 doctors now have agreed that it would be in her best interest.  She would then not need mammograms anymore.  The patient has cognitive issues and has had them for her whole life.  She has arthritis and idiopathic thrombocytopenia.  She has a rod in her right knee. She also has severe scoliosis.      Review of Systems  Constitutional:  Positive for activity change. Negative for appetite change.  Eyes: Negative.   Respiratory: Negative.    Cardiovascular: Negative.   Gastrointestinal: Negative.   Endocrine: Negative.   Genitourinary: Negative.   Musculoskeletal:  Positive for back pain, gait problem and neck pain.  Skin:  Positive for rash.       Objective:   Physical Exam Cardiovascular:     Rate and Rhythm: Normal rate.     Pulses: Normal pulses.  Pulmonary:     Effort: Pulmonary effort is normal.  Abdominal:     Palpations: Abdomen is soft.  Musculoskeletal:        General: No swelling or deformity.  Skin:    General: Skin is warm.     Capillary Refill: Capillary refill takes less than 2 seconds.     Coloration: Skin is not jaundiced.  Neurological:     Mental Status: She is alert and oriented to person, place, and time.       Assessment & Plan:     ICD-10-CM   1. Symptomatic mammary hypertrophy  N62       This is a quite a change from wanting a breast reduction.  I have prepared them that there may be issues with insurance but I understand.  It is worth trying to see if she is eligible for bilateral mastectomies.  We have made a referral to [facility].  Certainly willing to see them back if that route is not viable  and we can talk further about breast reduction.

## 2023-02-09 ENCOUNTER — Other Ambulatory Visit: Payer: Self-pay | Admitting: General Surgery

## 2023-02-09 DIAGNOSIS — K760 Fatty (change of) liver, not elsewhere classified: Secondary | ICD-10-CM

## 2023-02-09 DIAGNOSIS — D696 Thrombocytopenia, unspecified: Secondary | ICD-10-CM

## 2023-02-09 DIAGNOSIS — N62 Hypertrophy of breast: Secondary | ICD-10-CM

## 2023-02-09 DIAGNOSIS — G8929 Other chronic pain: Secondary | ICD-10-CM | POA: Diagnosis not present

## 2023-02-09 DIAGNOSIS — D72819 Decreased white blood cell count, unspecified: Secondary | ICD-10-CM

## 2023-02-09 DIAGNOSIS — D693 Immune thrombocytopenic purpura: Secondary | ICD-10-CM

## 2023-02-09 DIAGNOSIS — M549 Dorsalgia, unspecified: Secondary | ICD-10-CM | POA: Diagnosis not present

## 2023-02-28 ENCOUNTER — Encounter (HOSPITAL_COMMUNITY): Payer: Self-pay

## 2023-02-28 ENCOUNTER — Other Ambulatory Visit: Payer: Self-pay

## 2023-02-28 ENCOUNTER — Encounter (HOSPITAL_COMMUNITY)
Admission: RE | Admit: 2023-02-28 | Discharge: 2023-02-28 | Disposition: A | Discharge status: Home / Self Care | Payer: 59 | Source: Ambulatory Visit | Attending: General Surgery | Admitting: General Surgery

## 2023-02-28 DIAGNOSIS — N62 Hypertrophy of breast: Secondary | ICD-10-CM | POA: Diagnosis not present

## 2023-02-28 DIAGNOSIS — D693 Immune thrombocytopenic purpura: Secondary | ICD-10-CM | POA: Diagnosis not present

## 2023-02-28 DIAGNOSIS — D72819 Decreased white blood cell count, unspecified: Secondary | ICD-10-CM | POA: Insufficient documentation

## 2023-02-28 DIAGNOSIS — I69319 Unspecified symptoms and signs involving cognitive functions following cerebral infarction: Secondary | ICD-10-CM | POA: Diagnosis not present

## 2023-02-28 DIAGNOSIS — Z01812 Encounter for preprocedural laboratory examination: Secondary | ICD-10-CM | POA: Diagnosis not present

## 2023-02-28 DIAGNOSIS — K760 Fatty (change of) liver, not elsewhere classified: Secondary | ICD-10-CM | POA: Insufficient documentation

## 2023-02-28 DIAGNOSIS — D696 Thrombocytopenia, unspecified: Secondary | ICD-10-CM | POA: Diagnosis not present

## 2023-02-28 DIAGNOSIS — M4186 Other forms of scoliosis, lumbar region: Secondary | ICD-10-CM | POA: Diagnosis not present

## 2023-02-28 DIAGNOSIS — M79605 Pain in left leg: Secondary | ICD-10-CM | POA: Diagnosis not present

## 2023-02-28 DIAGNOSIS — E782 Mixed hyperlipidemia: Secondary | ICD-10-CM | POA: Diagnosis not present

## 2023-02-28 DIAGNOSIS — R251 Tremor, unspecified: Secondary | ICD-10-CM | POA: Diagnosis not present

## 2023-02-28 HISTORY — DX: Headache, unspecified: R51.9

## 2023-02-28 HISTORY — DX: Other complications of anesthesia, initial encounter: T88.59XA

## 2023-02-28 LAB — COMPREHENSIVE METABOLIC PANEL
ALT: 9 U/L (ref 0–44)
AST: 22 U/L (ref 15–41)
Albumin: 3.5 g/dL (ref 3.5–5.0)
Alkaline Phosphatase: 84 U/L (ref 38–126)
Anion gap: 6 (ref 5–15)
BUN: 25 mg/dL — ABNORMAL HIGH (ref 8–23)
CO2: 30 mmol/L (ref 22–32)
Calcium: 9.7 mg/dL (ref 8.9–10.3)
Chloride: 106 mmol/L (ref 98–111)
Creatinine, Ser: 0.88 mg/dL (ref 0.44–1.00)
GFR, Estimated: >60 mL/min (ref >60)
Glucose, Bld: 90 mg/dL (ref 70–99)
Potassium: 4.6 mmol/L (ref 3.5–5.1)
Sodium: 142 mmol/L (ref 135–145)
Total Bilirubin: 0.5 mg/dL (ref 0.3–1.2)
Total Protein: 6.2 g/dL — ABNORMAL LOW (ref 6.5–8.1)

## 2023-02-28 LAB — CBC WITH DIFFERENTIAL/PLATELET
Abs Immature Granulocytes: 0.01 10*3/uL (ref 0.00–0.07)
Basophils Absolute: 0 10*3/uL (ref 0.0–0.1)
Basophils Relative: 0 %
Eosinophils Absolute: 0 10*3/uL (ref 0.0–0.5)
Eosinophils Relative: 1 %
HCT: 39.8 % (ref 36.0–46.0)
Hemoglobin: 12.6 g/dL (ref 12.0–15.0)
Immature Granulocytes: 0 %
Lymphocytes Relative: 32 %
Lymphs Abs: 1.2 10*3/uL (ref 0.7–4.0)
MCH: 32.2 pg (ref 26.0–34.0)
MCHC: 31.7 g/dL (ref 30.0–36.0)
MCV: 101.8 fL — ABNORMAL HIGH (ref 80.0–100.0)
Monocytes Absolute: 0.3 10*3/uL (ref 0.1–1.0)
Monocytes Relative: 8 %
Neutro Abs: 2.1 10*3/uL (ref 1.7–7.7)
Neutrophils Relative %: 59 %
Platelets: 151 10*3/uL (ref 150–400)
RBC: 3.91 MIL/uL (ref 3.87–5.11)
RDW: 13 % (ref 11.5–15.5)
WBC: 3.6 10*3/uL — ABNORMAL LOW (ref 4.0–10.5)
nRBC: 0 % (ref 0.0–0.2)

## 2023-02-28 LAB — TYPE AND SCREEN
ABO/RH(D): A POS
Antibody Screen: NEGATIVE

## 2023-02-28 LAB — PROTIME-INR
INR: 0.9 (ref 0.8–1.2)
Prothrombin Time: 12.3 seconds (ref 11.4–15.2)

## 2023-02-28 NOTE — Progress Notes (Signed)
Surgical Instructions    Your procedure is scheduled on March 12.  Report to [facility] Main Entrance "A" at 10:30 A.M., then check in with the Admitting office.  Call this number if you have problems the morning of surgery:  [phone]   If you have any questions prior to your surgery date call [phone]: Open Monday-Friday 8am-4pm If you experience any cold or flu symptoms such as cough, fever, chills, shortness of breath, etc. between now and your scheduled surgery, please notify us at the above number     Remember:  Do not eat after midnight the night before your surgery  You may drink clear liquids until 10:30 the morning of your surgery.   Clear liquids allowed are: Water, Non-Citrus Juices (without pulp), Carbonated Beverages, Clear Tea, Black Coffee ONLY (NO MILK, CREAM OR POWDERED CREAMER of any kind), and Gatorade.    Enhanced Recovery after Surgery for Orthopedics Enhanced Recovery after Surgery is a protocol used to improve the stress on your body and your recovery after surgery.  Patient Instructions  The day of surgery (if you do NOT have diabetes):  Drink ONE (1) Pre-Surgery Clear Ensure by 10:30 am the morning of surgery   This drink was given to you during your hospital  pre-op appointment visit. Nothing else to drink after completing the  Pre-Surgery Clear Ensure.         If you have questions, please contact your surgeon's office.     Take these medicines the morning of surgery with A SIP OF WATER:  cetirizine (ZYRTEC)  desvenlafaxine (PRISTIQ)  If needed:  acetaminophen (TYLENOL)  albuterol (VENTOLIN HFA) please bring day of surgery. oxymetazoline (AFRIN)   As of today, STOP taking any Aspirin (unless otherwise instructed by your surgeon) Aleve, Naproxen, Ibuprofen, Motrin, Advil, Goody's, BC's, all herbal medications, fish oil, and all vitamins.           Do not wear jewelry or makeup. Do not wear lotions, powders, perfumes/cologne or  deodorant. Do not shave 48 hours prior to surgery.  Men may shave face and neck. Do not bring valuables to the hospital. Do not wear nail polish, gel polish, artificial nails, or any other type of covering on natural nails (fingers and toes) If you have artificial nails or gel coating that need to be removed by a nail salon, please have this removed prior to surgery. Artificial nails or gel coating may interfere with anesthesia's ability to adequately monitor your vital signs.  [facility] is not responsible for any belongings or valuables.    Do NOT Smoke (Tobacco/Vaping)  24 hours prior to your procedure  If you use a CPAP at night, you may bring your mask for your overnight stay.   Contacts, glasses, hearing aids, dentures or partials may not be worn into surgery, please bring cases for these belongings   For patients admitted to the hospital, discharge time will be determined by your treatment team.   Patients discharged the day of surgery will not be allowed to drive home, and someone needs to stay with them for 24 hours.   SURGICAL WAITING ROOM VISITATION Patients having surgery or a procedure may have no more than 2 support people in the waiting area - these visitors may rotate.   Children under the age of 38 must have an adult with them who is not the patient. If the patient needs to stay at the hospital during part of their recovery, the visitor guidelines for inpatient rooms apply.  Pre-op nurse will coordinate an appropriate time for 1 support person to accompany patient in pre-op.  This support person may not rotate.   Please refer to [email or web address] for the visitor guidelines for Inpatients (after your surgery is over and you are in a regular room).    Special instructions:    Oral Hygiene is also important to reduce your risk of infection.  Remember - BRUSH YOUR TEETH THE MORNING OF SURGERY WITH YOUR REGULAR  TOOTHPASTE   [facility]- Preparing For Surgery  Before surgery, you can play an important role. Because skin is not sterile, your skin needs to be as free of germs as possible. You can reduce the number of germs on your skin by washing with CHG (chlorahexidine gluconate) Soap before surgery.  CHG is an antiseptic cleaner which kills germs and bonds with the skin to continue killing germs even after washing.     Please do not use if you have an allergy to CHG or antibacterial soaps. If your skin becomes reddened/irritated stop using the CHG.  Do not shave (including legs and underarms) for at least 48 hours prior to first CHG shower. It is OK to shave your face.  Please follow these instructions carefully.     Shower the NIGHT BEFORE SURGERY and the MORNING OF SURGERY with CHG Soap.   If you chose to wash your hair, wash your hair first as usual with your normal shampoo. After you shampoo, rinse your hair and body thoroughly to remove the shampoo.  Then [employer] and genitals (private parts) with your normal soap and rinse thoroughly to remove soap.  After that Use CHG Soap as you would any other liquid soap. You can apply CHG directly to the skin and wash gently with a scrungie or a clean washcloth.   Apply the CHG Soap to your body ONLY FROM THE NECK DOWN.  Do not use on open wounds or open sores. Avoid contact with your eyes, ears, mouth and genitals (private parts). Wash Face and genitals (private parts)  with your normal soap.   Wash thoroughly, paying special attention to the area where your surgery will be performed.  Thoroughly rinse your body with warm water from the neck down.  DO NOT shower/wash with your normal soap after using and rinsing off the CHG Soap.  Pat yourself dry with a CLEAN TOWEL.  Wear CLEAN PAJAMAS to bed the night before surgery  Place CLEAN SHEETS on your bed the night before your surgery  DO NOT SLEEP WITH PETS.   Day of Surgery:  Take a shower  with CHG soap. Wear Clean/Comfortable clothing the morning of surgery Do not apply any deodorants/lotions.   Remember to brush your teeth WITH YOUR REGULAR TOOTHPASTE.    If you received a COVID test during your pre-op visit, it is requested that you wear a mask when out in public, stay away from anyone that may not be feeling well, and notify your surgeon if you develop symptoms. If you have been in contact with anyone that has tested positive in the last 10 days, please notify your surgeon.    Please read over the following fact sheets that you were given.

## 2023-02-28 NOTE — Progress Notes (Signed)
PCP - Raelyn Number PA Cardiologist - Denies  PPM/ICD - Denies  Chest x-ray - Denies EKG - Denies Stress Test - Denies ECHO - Denies Cardiac Cath - Denies  Sleep Study - Denies  DM: Denies  Blood Thinner Instructions: N/A Aspirin Instructions: N/A  ERAS Protcol - Yes PRE-SURGERY Ensure or G2- Ensure  COVID TEST- N/A   Anesthesia review: No.  Patient denies shortness of breath, fever, cough and chest pain at PAT appointment   All instructions explained to the patient, with a verbal understanding of the material. Patient agrees to go over the instructions while at home for a better understanding.The opportunity to ask questions was provided.

## 2023-03-05 NOTE — Progress Notes (Signed)
Patient's care giver voiced understanding of new arrival time of 1100 tomorrow

## 2023-03-06 ENCOUNTER — Ambulatory Visit (HOSPITAL_COMMUNITY)
Admission: RE | Admit: 2023-03-06 | Discharge: 2023-03-07 | Disposition: A | Discharge status: Home / Self Care | Payer: 59 | Attending: General Surgery | Admitting: General Surgery

## 2023-03-06 ENCOUNTER — Ambulatory Visit (HOSPITAL_COMMUNITY): Payer: 59 | Admitting: Certified Registered"

## 2023-03-06 ENCOUNTER — Encounter (HOSPITAL_COMMUNITY)
Admission: RE | Disposition: A | Discharge status: Home / Self Care | Payer: Self-pay | Source: Home / Self Care | Attending: General Surgery

## 2023-03-06 ENCOUNTER — Other Ambulatory Visit: Payer: Self-pay

## 2023-03-06 ENCOUNTER — Ambulatory Visit (HOSPITAL_BASED_OUTPATIENT_CLINIC_OR_DEPARTMENT_OTHER): Payer: 59 | Admitting: Certified Registered"

## 2023-03-06 ENCOUNTER — Encounter (HOSPITAL_COMMUNITY): Payer: Self-pay | Admitting: General Surgery

## 2023-03-06 DIAGNOSIS — N62 Hypertrophy of breast: Secondary | ICD-10-CM

## 2023-03-06 DIAGNOSIS — N6001 Solitary cyst of right breast: Secondary | ICD-10-CM | POA: Insufficient documentation

## 2023-03-06 DIAGNOSIS — D693 Immune thrombocytopenic purpura: Secondary | ICD-10-CM | POA: Diagnosis not present

## 2023-03-06 DIAGNOSIS — G8929 Other chronic pain: Secondary | ICD-10-CM | POA: Diagnosis not present

## 2023-03-06 DIAGNOSIS — F79 Unspecified intellectual disabilities: Secondary | ICD-10-CM | POA: Diagnosis not present

## 2023-03-06 DIAGNOSIS — F418 Other specified anxiety disorders: Secondary | ICD-10-CM | POA: Diagnosis not present

## 2023-03-06 DIAGNOSIS — D242 Benign neoplasm of left breast: Secondary | ICD-10-CM | POA: Insufficient documentation

## 2023-03-06 DIAGNOSIS — M1990 Unspecified osteoarthritis, unspecified site: Secondary | ICD-10-CM

## 2023-03-06 DIAGNOSIS — N6011 Diffuse cystic mastopathy of right breast: Secondary | ICD-10-CM | POA: Diagnosis not present

## 2023-03-06 DIAGNOSIS — M549 Dorsalgia, unspecified: Secondary | ICD-10-CM | POA: Insufficient documentation

## 2023-03-06 DIAGNOSIS — N6489 Other specified disorders of breast: Secondary | ICD-10-CM | POA: Diagnosis not present

## 2023-03-06 DIAGNOSIS — M419 Scoliosis, unspecified: Secondary | ICD-10-CM | POA: Diagnosis not present

## 2023-03-06 DIAGNOSIS — N6012 Diffuse cystic mastopathy of left breast: Secondary | ICD-10-CM | POA: Diagnosis not present

## 2023-03-06 DIAGNOSIS — G8918 Other acute postprocedural pain: Secondary | ICD-10-CM | POA: Diagnosis not present

## 2023-03-06 DIAGNOSIS — Z4001 Encounter for prophylactic removal of breast: Secondary | ICD-10-CM | POA: Diagnosis not present

## 2023-03-06 HISTORY — PX: TOTAL MASTECTOMY: SHX6129

## 2023-03-06 LAB — ABO/RH: ABO/RH(D): A POS

## 2023-03-06 SURGERY — MASTECTOMY, SIMPLE
Anesthesia: General | Site: Breast | Laterality: Bilateral

## 2023-03-06 MED ORDER — 0.9 % SODIUM CHLORIDE (POUR BTL) OPTIME
TOPICAL | Status: DC | PRN
  Administered 2023-03-06: 1000 mL

## 2023-03-06 MED ORDER — DEXAMETHASONE SODIUM PHOSPHATE 10 MG/ML IJ SOLN
INTRAMUSCULAR | Status: AC
  Filled 2023-03-06: qty 1

## 2023-03-06 MED ORDER — FENTANYL CITRATE (PF) 100 MCG/2ML IJ SOLN
INTRAMUSCULAR | Status: AC
  Administered 2023-03-06: 50 ug via INTRAVENOUS
  Filled 2023-03-06: qty 2

## 2023-03-06 MED ORDER — PROMETHAZINE HCL 25 MG/ML IJ SOLN: 6.2500 mg | INTRAMUSCULAR | Status: DC | PRN

## 2023-03-06 MED ORDER — FENTANYL CITRATE (PF) 250 MCG/5ML IJ SOLN
INTRAMUSCULAR | Status: DC | PRN
  Administered 2023-03-06 (×3): 25 ug via INTRAVENOUS
  Administered 2023-03-06: 50 ug via INTRAVENOUS
  Administered 2023-03-06: 25 ug via INTRAVENOUS
  Administered 2023-03-06: 50 ug via INTRAVENOUS

## 2023-03-06 MED ORDER — DEXAMETHASONE SODIUM PHOSPHATE 10 MG/ML IJ SOLN
INTRAMUSCULAR | Status: DC | PRN
  Administered 2023-03-06 (×2): 5 mg

## 2023-03-06 MED ORDER — MIDAZOLAM HCL 2 MG/2ML IJ SOLN
INTRAMUSCULAR | Status: AC
  Administered 2023-03-06: 1 mg via INTRAVENOUS
  Filled 2023-03-06: qty 2

## 2023-03-06 MED ORDER — LIDOCAINE 2% (20 MG/ML) 5 ML SYRINGE
INTRAMUSCULAR | Status: DC | PRN
  Administered 2023-03-06: 60 mg via INTRAVENOUS

## 2023-03-06 MED ORDER — TRAMADOL HCL 50 MG PO TABS
50.0000 mg | ORAL_TABLET | Freq: Four times a day (QID) | ORAL | Status: DC | PRN
  Administered 2023-03-06: 50 mg via ORAL
  Filled 2023-03-06: qty 1

## 2023-03-06 MED ORDER — PHENYLEPHRINE HCL-NACL 20-0.9 MG/250ML-% IV SOLN
INTRAVENOUS | Status: DC | PRN
  Administered 2023-03-06: 80 ug via INTRAVENOUS
  Administered 2023-03-06: 20 ug/min via INTRAVENOUS
  Administered 2023-03-06: 80 ug via INTRAVENOUS

## 2023-03-06 MED ORDER — FENTANYL CITRATE (PF) 100 MCG/2ML IJ SOLN
INTRAMUSCULAR | Status: AC
  Filled 2023-03-06: qty 2

## 2023-03-06 MED ORDER — CHLORHEXIDINE GLUCONATE CLOTH 2 % EX PADS: 6.0000 | MEDICATED_PAD | Freq: Once | CUTANEOUS | Status: DC

## 2023-03-06 MED ORDER — OXYCODONE HCL 5 MG PO TABS: 5.0000 mg | ORAL_TABLET | ORAL | Status: DC | PRN

## 2023-03-06 MED ORDER — FENTANYL CITRATE (PF) 100 MCG/2ML IJ SOLN
25.0000 ug | INTRAMUSCULAR | Status: DC | PRN
  Administered 2023-03-06: 25 ug via INTRAVENOUS

## 2023-03-06 MED ORDER — FENTANYL CITRATE (PF) 100 MCG/2ML IJ SOLN: 50.0000 ug | Freq: Once | INTRAMUSCULAR | Status: AC

## 2023-03-06 MED ORDER — ORAL CARE MOUTH RINSE: 15.0000 mL | Freq: Once | OROMUCOSAL | Status: AC

## 2023-03-06 MED ORDER — DEXAMETHASONE SODIUM PHOSPHATE 10 MG/ML IJ SOLN
INTRAMUSCULAR | Status: DC | PRN
  Administered 2023-03-06: 5 mg via INTRAVENOUS

## 2023-03-06 MED ORDER — CEFAZOLIN SODIUM-DEXTROSE 2-4 GM/100ML-% IV SOLN
2.0000 g | Freq: Three times a day (TID) | INTRAVENOUS | Status: AC
  Administered 2023-03-06: 2 g via INTRAVENOUS
  Filled 2023-03-06 (×2): qty 100

## 2023-03-06 MED ORDER — HEMOSTATIC AGENTS (NO CHARGE) OPTIME
TOPICAL | Status: DC | PRN
  Administered 2023-03-06: 2

## 2023-03-06 MED ORDER — BUPIVACAINE-EPINEPHRINE (PF) 0.25% -1:200000 IJ SOLN
INTRAMUSCULAR | Status: DC | PRN
  Administered 2023-03-06 (×2): 30 mL via PERINEURAL

## 2023-03-06 MED ORDER — PROPOFOL 10 MG/ML IV BOLUS
INTRAVENOUS | Status: DC | PRN
  Administered 2023-03-06: 150 ug/kg/min via INTRAVENOUS
  Administered 2023-03-06: 150 mg via INTRAVENOUS
  Administered 2023-03-06: 150 ug/kg/min via INTRAVENOUS

## 2023-03-06 MED ORDER — FENTANYL CITRATE (PF) 250 MCG/5ML IJ SOLN
INTRAMUSCULAR | Status: AC
  Filled 2023-03-06: qty 5

## 2023-03-06 MED ORDER — KCL IN DEXTROSE-NACL 20-5-0.45 MEQ/L-%-% IV SOLN
INTRAVENOUS | Status: AC
  Filled 2023-03-06: qty 1000

## 2023-03-06 MED ORDER — MIDAZOLAM HCL 2 MG/2ML IJ SOLN: 1.0000 mg | Freq: Once | INTRAMUSCULAR | Status: AC

## 2023-03-06 MED ORDER — ACETAMINOPHEN 500 MG PO TABS
1000.0000 mg | ORAL_TABLET | Freq: Three times a day (TID) | ORAL | Status: DC
  Administered 2023-03-06 – 2023-03-07 (×2): 1000 mg via ORAL
  Filled 2023-03-06 (×2): qty 2

## 2023-03-06 MED ORDER — LACTATED RINGERS IV SOLN: INTRAVENOUS | Status: DC

## 2023-03-06 MED ORDER — SUGAMMADEX SODIUM 200 MG/2ML IV SOLN
INTRAVENOUS | Status: DC | PRN
  Administered 2023-03-06: 200 mg via INTRAVENOUS

## 2023-03-06 MED ORDER — METHOCARBAMOL 500 MG PO TABS: 500.0000 mg | ORAL_TABLET | Freq: Four times a day (QID) | ORAL | Status: DC | PRN

## 2023-03-06 MED ORDER — ONDANSETRON HCL 4 MG/2ML IJ SOLN
INTRAMUSCULAR | Status: AC
  Filled 2023-03-06: qty 2

## 2023-03-06 MED ORDER — MORPHINE SULFATE (PF) 2 MG/ML IV SOLN: 1.0000 mg | INTRAVENOUS | Status: DC | PRN

## 2023-03-06 MED ORDER — CEFAZOLIN SODIUM-DEXTROSE 2-4 GM/100ML-% IV SOLN
2.0000 g | INTRAVENOUS | Status: AC
  Administered 2023-03-06: 2 g via INTRAVENOUS
  Filled 2023-03-06: qty 100

## 2023-03-06 MED ORDER — ACETAMINOPHEN 500 MG PO TABS
1000.0000 mg | ORAL_TABLET | ORAL | Status: AC
  Administered 2023-03-06: 1000 mg via ORAL
  Filled 2023-03-06: qty 2

## 2023-03-06 MED ORDER — CHLORHEXIDINE GLUCONATE 0.12 % MT SOLN
15.0000 mL | Freq: Once | OROMUCOSAL | Status: AC
  Administered 2023-03-06: 15 mL via OROMUCOSAL
  Filled 2023-03-06: qty 15

## 2023-03-06 MED ORDER — TRAMADOL HCL 50 MG PO TABS
ORAL_TABLET | ORAL | Status: AC
  Filled 2023-03-06: qty 1

## 2023-03-06 MED ORDER — ONDANSETRON HCL 4 MG/2ML IJ SOLN
INTRAMUSCULAR | Status: DC | PRN
  Administered 2023-03-06: 4 mg via INTRAVENOUS

## 2023-03-06 MED ORDER — ROCURONIUM BROMIDE 10 MG/ML (PF) SYRINGE
PREFILLED_SYRINGE | INTRAVENOUS | Status: DC | PRN
  Administered 2023-03-06 (×2): 20 mg via INTRAVENOUS
  Administered 2023-03-06: 60 mg via INTRAVENOUS

## 2023-03-06 MED ORDER — ROCURONIUM BROMIDE 10 MG/ML (PF) SYRINGE
PREFILLED_SYRINGE | INTRAVENOUS | Status: AC
  Filled 2023-03-06: qty 10

## 2023-03-06 SURGICAL SUPPLY — 45 items
ADH SKN CLS APL DERMABOND .7 (GAUZE/BANDAGES/DRESSINGS) ×2
AGENT HMST 10 BLLW SHRT CANN (HEMOSTASIS) ×2
APL PRP STRL LF DISP 70% ISPRP (MISCELLANEOUS) ×1
BAG COUNTER SPONGE SURGICOUNT (BAG) ×2 IMPLANT
BAG SPNG CNTER NS LX DISP (BAG) ×1
BINDER BREAST LRG (GAUZE/BANDAGES/DRESSINGS) IMPLANT
BINDER BREAST XLRG (GAUZE/BANDAGES/DRESSINGS) IMPLANT
BIOPATCH RED 1 DISK 7.0 (GAUZE/BANDAGES/DRESSINGS) IMPLANT
CANISTER SUCT 3000ML PPV (MISCELLANEOUS) ×4 IMPLANT
CHLORAPREP W/TINT 26 (MISCELLANEOUS) ×2 IMPLANT
CLIP TI MEDIUM 24 (CLIP) IMPLANT
COVER SURGICAL LIGHT HANDLE (MISCELLANEOUS) ×2 IMPLANT
DERMABOND ADVANCED .7 DNX12 (GAUZE/BANDAGES/DRESSINGS) ×2 IMPLANT
DRAIN CHANNEL 19F RND (DRAIN) ×2 IMPLANT
DRAPE CHEST BREAST 15X10 FENES (DRAPES) ×2 IMPLANT
DRSG TEGADERM 4X4.75 (GAUZE/BANDAGES/DRESSINGS) IMPLANT
ELECT REM PT RETURN 9FT ADLT (ELECTROSURGICAL) ×1
ELECTRODE REM PT RTRN 9FT ADLT (ELECTROSURGICAL) ×2 IMPLANT
EVACUATOR SILICONE 100CC (DRAIN) ×2 IMPLANT
GAUZE PAD ABD 8X10 STRL (GAUZE/BANDAGES/DRESSINGS) ×2 IMPLANT
GAUZE SPONGE 4X4 12PLY STRL (GAUZE/BANDAGES/DRESSINGS) ×2 IMPLANT
GLOVE BIO SURGEON STRL SZ 6 (GLOVE) ×2 IMPLANT
GLOVE BIO SURGEON STRL SZ 6.5 (GLOVE) IMPLANT
GLOVE BIOGEL PI IND STRL 6 (GLOVE) IMPLANT
GLOVE BIOGEL PI IND STRL 6.5 (GLOVE) IMPLANT
GLOVE INDICATOR 6.5 STRL GRN (GLOVE) ×2 IMPLANT
GLOVE SS BIOGEL STRL SZ 6.5 (GLOVE) IMPLANT
GOWN STRL REUS W/ TWL LRG LVL3 (GOWN DISPOSABLE) ×2 IMPLANT
GOWN STRL REUS W/ TWL XL LVL3 (GOWN DISPOSABLE) ×2 IMPLANT
GOWN STRL REUS W/TWL LRG LVL3 (GOWN DISPOSABLE) ×2
GOWN STRL REUS W/TWL XL LVL3 (GOWN DISPOSABLE) ×1
HEMOSTAT HEMOBLAST BELLOWS (HEMOSTASIS) IMPLANT
KIT BASIN OR (CUSTOM PROCEDURE TRAY) ×2 IMPLANT
KIT TURNOVER KIT B (KITS) ×2 IMPLANT
LIGHT WAVEGUIDE WIDE FLAT (MISCELLANEOUS) IMPLANT
NS IRRIG 1000ML POUR BTL (IV SOLUTION) ×2 IMPLANT
PACK GENERAL/GYN (CUSTOM PROCEDURE TRAY) ×2 IMPLANT
PAD ARMBOARD 7.5X6 YLW CONV (MISCELLANEOUS) ×2 IMPLANT
STRIP CLOSURE SKIN 1/2X4 (GAUZE/BANDAGES/DRESSINGS) ×2 IMPLANT
SUT ETHILON 2 0 FS 18 (SUTURE) ×2 IMPLANT
SUT MNCRL AB 4-0 PS2 18 (SUTURE) IMPLANT
SUT MON AB 4-0 PC3 18 (SUTURE) ×2 IMPLANT
SUT VIC AB 3-0 SH 8-18 (SUTURE) ×2 IMPLANT
TOWEL GREEN STERILE (TOWEL DISPOSABLE) ×2 IMPLANT
TOWEL GREEN STERILE FF (TOWEL DISPOSABLE) ×2 IMPLANT

## 2023-03-06 NOTE — Transfer of Care (Signed)
Immediate Anesthesia Transfer of Care Note  Patient: Heather [name]  Procedure(s) Performed: BILATERAL TOTAL MASTECTOMY (Bilateral: Breast)  Patient Location: PACU  Anesthesia Type:GA combined with regional for post-op pain  Level of Consciousness: drowsy and patient cooperative  Airway & Oxygen Therapy: Patient Spontanous Breathing  Post-op Assessment: Report given to RN and Post -op Vital signs reviewed and stable  Post vital signs: Reviewed and stable  Last Vitals:  Vitals Value Taken Time  BP 107/52 03/06/23 1652  Temp    Pulse 87 03/06/23 1656  Resp 23 03/06/23 1656  SpO2 99 % 03/06/23 1656  Vitals shown include unvalidated device data.  Last Pain:  Vitals:   03/06/23 1129  TempSrc:   PainSc: 0-No pain         Complications: No notable events documented.

## 2023-03-06 NOTE — Anesthesia Preprocedure Evaluation (Addendum)
Anesthesia Evaluation  Patient identified by MRN, date of birth, ID band Patient awake    Reviewed: Allergy & Precautions, NPO status , Patient's Chart, lab work & pertinent test results  History of Anesthesia Complications (+) PROLONGED EMERGENCE and history of anesthetic complications  Airway Mallampati: II  TM Distance: >3 FB Neck ROM: Full    Dental  (+) Dental Advisory Given, Edentulous Lower, Edentulous Upper   Pulmonary neg pulmonary ROS   Pulmonary exam normal breath sounds clear to auscultation       Cardiovascular negative cardio ROS Normal cardiovascular exam Rhythm:Regular Rate:Normal     Neuro/Psych  Headaches PSYCHIATRIC DISORDERS Anxiety Depression       GI/Hepatic negative GI ROS, Neg liver ROS,,,  Endo/Other  negative endocrine ROS    Renal/GU negative Renal ROS     Musculoskeletal  (+) Arthritis ,    Abdominal   Peds  (+) mental retardation Hematology negative hematology ROS (+)   Anesthesia Other Findings Day of surgery medications reviewed with the patient.  BILATERAL MAMMARY HYPERTROPHY, BACK PAIN, INTELLECTUAL DISABILITY  Reproductive/Obstetrics                             Anesthesia Physical Anesthesia Plan  ASA: 3  Anesthesia Plan: General   Post-op Pain Management: Tylenol PO (pre-op)* and Regional block*   Induction: Intravenous  PONV Risk Score and Plan: 3 and Midazolam, Dexamethasone and Ondansetron  Airway Management Planned: Oral ETT  Additional Equipment:   Intra-op Plan:   Post-operative Plan: Extubation in OR  Informed Consent: I have reviewed the patients History and Physical, chart, labs and discussed the procedure including the risks, benefits and alternatives for the proposed anesthesia with the patient or authorized representative who has indicated his/her understanding and acceptance.     Dental advisory given  Plan Discussed  with: CRNA  Anesthesia Plan Comments:        Anesthesia Quick Evaluation

## 2023-03-06 NOTE — Op Note (Signed)
Bilateral Mastectomy  Indications: This patient presents with history of symptomatic mammary hypertrophy, back pain, intellectual disability  Pre-operative Diagnosis: see above  Post-operative Diagnosis: same  Surgeon: Stark Klein   Assistant:  Gabriel Carina, [facility]  Anesthesia: General endotracheal anesthesia and pectoral block  ASA Class: 3  Procedure Details  The patient was seen in the Holding Room. The risks, benefits, complications, treatment options, and expected outcomes were discussed with the patient. The possibilities of reaction to medication, pulmonary aspiration, bleeding, infection, the need for additional procedures, failure to diagnose a condition, and creating a complication requiring transfusion or operation were discussed with the patient. The patient concurred with the proposed plan, giving informed consent.  The site of surgery properly noted/marked. The patient was taken to Operating Room # 2, identified as [place] and the procedure verified as bilateral Mastectomy.  A Time Out was held and the above information confirmed.    After induction of anesthesia, the bilateral breast and chest were prepped and draped in standard fashion.  The right side was addressed first.  The borders of the breast were identified and marked.  A Wise type incision was drawn out.  The superior incision was made with the #10 blade.  Mastectomy hooks were used to provide elevation of the skin edges, and the cautery was used to create the mastectomy flaps.  The dissection was taken to the fascia of the pectoralis major.  The penetrating vessels were clipped as needed.  The superior flap was taken medially to the lateral sternal border, superiorly to the inferior border of the clavicle.  The inferior flap incision was taken directly down to the serratus fascia at the inframammary fold.  The breast was taken off including the pectoralis fascia and the axillary tail marked.    The wound was  irrigated.  Hemostasis was achieved with cautery.  One 19 Blake drain was placed laterally and secured with a 2-0 nylon.  Hemoblast was administered over the muscle and on the undersurface of the skin flaps.    The left side was then addressed similarly.    The wound was closed in an anchor with 3-0 Vicryl deep dermal interrupted sutures and 4-0 Vicryl subcuticular closure in layers.     Sterile dressings were applied. At the end of the operation, all sponge, instrument, and needle counts were correct.  Findings: grossly clear surgical margins  Estimated Blood Loss: 50 mL          Drains: 19 Fr blake drain in right chest wall and 19 Fr blake drain in left chest wall                Specimens: right breast and left breast.           Complications:  None; patient tolerated the procedure well.         Disposition: PACU - hemodynamically stable.         Condition: stable

## 2023-03-06 NOTE — Anesthesia Procedure Notes (Signed)
Anesthesia Regional Block: Pectoralis block   Pre-Anesthetic Checklist: , timeout performed,  Correct Patient, Correct Site, Correct Laterality,  Correct Procedure, Correct Position, site marked,  Risks and benefits discussed,  Surgical consent,  Pre-op evaluation,  At surgeon's request and post-op pain management  Laterality: Right  Prep: chloraprep       Needles:  Injection technique: Single-shot  Needle Type: Echogenic Needle     Needle Length: 9cm  Needle Gauge: 21     Additional Needles:   Procedures:,,,, ultrasound used (permanent image in chart),,    Narrative:  Start time: 03/06/2023 12:13 PM End time: 03/06/2023 12:19 PM Injection made incrementally with aspirations every 5 mL.  Performed by: Personally  Anesthesiologist: Santa Lighter, MD  Additional Notes: No pain on injection. No increased resistance to injection. Injection made in 5cc increments.  Good needle visualization.  Patient tolerated procedure well.

## 2023-03-06 NOTE — Anesthesia Procedure Notes (Addendum)
Procedure Name: Intubation Date/Time: 03/06/2023 1:28 PM  Performed by: Thelma Comp, CRNAPre-anesthesia Checklist: Patient identified, Emergency Drugs available, Suction available and Patient being monitored Patient Re-evaluated:Patient Re-evaluated prior to induction Oxygen Delivery Method: Circle System Utilized Preoxygenation: Pre-oxygenation with 100% oxygen Induction Type: IV induction Ventilation: Mask ventilation without difficulty Laryngoscope Size: Mac and 3 Grade View: Grade I Tube type: Oral Tube size: 7.0 mm Number of attempts: 1 Airway Equipment and Method: Stylet Placement Confirmation: ETT inserted through vocal cords under direct vision, positive ETCO2 and breath sounds checked- equal and bilateral Secured at: 21 cm Tube secured with: Tape Dental Injury: Teeth and Oropharynx as per pre-operative assessment  Comments: Placed by Eldridge Dace, SRNA under CRNA and MDA supervision.

## 2023-03-06 NOTE — H&P (Signed)
REFERRING PHYSICIAN: [place]  PROVIDER: Georgianne Fick, MD  Care Team: Patient Care Team: [name], [place] as PCP - General (Family Medicine)  MRN: [id number] DOB: [birth year] DATE OF ENCOUNTER: 02/09/2023  Subjective  Chief Complaint: New Consultation  History of Present Illness: Heather [name] is a 62 y.o. female who is seen today as an office consultation at the request of Dr. Migdalia Dk for evaluation of New Consultation  Pt is referred by Dr. Marla Roe as she desires bilateral mastectomies. She has developmental delay and originally desired breast reduction. She has no personal cancer history. Patient does not have any known family history, but her power of attorney states that her mother was not forthcoming with any family history prior to her death and her father's history is unknown. She has back pain and h/o scoliosis. She also has h/o ITP and was on prednisone, but this has been able to be stopped. Dr. Marla Roe had recommended that she hold prednisone for 6 weeks pre op. However, after consideration, her POA and she decided to see about bilateral mastectomies.  Patient has significant back pain. It is unclear if this is related to her scoliosis or the weight of her breast. She has had difficulty with bras fitting due to her small stature and large breast. The bras will hold her breast and and they fall out whenever she leans forward. She has had numerous breast fittings in various places and has had bras of all different qualities. None of them have been successful in containing her breast.  Due to her intellectual disability, it is extraordinarily distressing every year getting her mammogram.  Family cancer history n/a  Work: disability.  Diagnostic mammogram:BCG 05/09/2022 ACR Breast Density Category b: There are scattered areas of fibroglandular density.  FINDINGS: There are no findings suspicious for malignancy.  IMPRESSION: No mammographic evidence of  malignancy. A result letter of this screening mammogram will be mailed directly to the patient.  RECOMMENDATION: Screening mammogram in one year. (Code:[id number]-01Y)  BI-RADS CATEGORY 1: Negative.  Review of Systems: A complete review of systems was obtained from the patient. I have reviewed this information and discussed as appropriate with the patient. See HPI as well for other ROS.  ROS: ringing ears, congestion, cough, sputum, heartburn, diarrhea, easy bruising, headache, depression  Medical History: Past Medical History: Diagnosis Date Anxiety Arthritis  Patient Active Problem List Diagnosis Anxiety state Arthritis Back pain Chronic ITP (idiopathic thrombocytopenia) (CMS-HCC) Chronic leukopenia Fatty liver disease, nonalcoholic Intellectual disability Other specified abnormal immunological findings in serum Symptomatic mammary hypertrophy  Past Surgical History: Procedure Laterality Date leg surgery   No Known Allergies  Current Outpatient Medications on File Prior to Visit Medication Sig Dispense Refill cholecalciferol (VITAMIN D3) 1000 unit tablet Take by mouth desvenlafaxine succinate (PRISTIQ) 50 MG ER tablet Take 50 mg by mouth once daily losartan (COZAAR) 25 MG tablet Take 10 mg by mouth once daily multivitamin tablet Take 1 tablet by mouth once daily  No current facility-administered medications on file prior to visit.  Family History Problem Relation Age of Onset Coronary Artery Disease (Blocked arteries around heart) Mother Diabetes Mother   Social History  Tobacco Use Smoking Status Never Smokeless Tobacco Never   Social History  Socioeconomic History Marital status: Single Tobacco Use Smoking status: Never Smokeless tobacco: Never Substance and Sexual Activity Alcohol use: Not Currently Drug use: Never  Objective:  Vitals: 02/09/23 0909 BP: 116/74 Pulse: 110 Temp: 36.7 C (98 F) SpO2: 98% Weight: 60.8 kg (134 lb)  Height:  149.9 cm ('4\' 11"'$ )  Body mass index is 27.06 kg/m.  Gen: No acute distress. Well nourished and well groomed. Neurological: Alert and oriented to person, place, and time. Coordination normal. Head: Normocephalic and atraumatic. Eyes: Conjunctivae are normal. Pupils are equal, round, and reactive to light. No scleral icterus. Neck: Normal range of motion. Neck supple. No tracheal deviation or thyromegaly present. Cardiovascular: Normal rate, regular rhythm, normal heart sounds and intact distal pulses. Exam reveals no gallop and no friction rub. No murmur heard. Breast: bilateral significant ptosis. Kyphosis, no palpable masses. No LAD. No nipple retraction or nipple discharge. Respiratory: Effort normal. No respiratory distress. No chest wall tenderness. Breath sounds normal. No wheezes, rales or rhonchi. GI: Soft. Bowel sounds are normal. The abdomen is soft and nontender. There is no rebound and no guarding. Musculoskeletal: Normal range of motion. Extremities are nontender. Lymphadenopathy: No cervical, preauricular, postauricular or axillary adenopathy is present Skin: Skin is warm and dry. No rash noted. No diaphoresis. No erythema. No pallor. No clubbing, cyanosis, or edema. Psychiatric: Normal mood and affect. Behavior is normal. Judgment and thought content normal.  Labs Last CBC in the system is 11/09/2022 WBCs 3.2, Hgb 11.7, plts 174k  Assessment and Plan:  ICD-10-CM 1. Chronic ITP (idiopathic thrombocytopenia) (CMS-HCC) D69.3  2. Chronic back pain, unspecified back location, unspecified back pain laterality M54.9 G89.29  3. Intellectual disability F79  4. Symptomatic mammary hypertrophy N62   Patient has significant breast size and ptosis especially for her frame. She is 4'11" with a very narrow shoulder and chest. She has been unable to find good fitting supportive bras. She also has intellectual disability that affects her ability to have adequate screening. Her family  history is unknown.  I do think it is reasonable for her to have bilateral mastectomies given these factors. I discussed the anchor type incisions that would be required to try to get the chest wall flap. I discussed that there is sometimes a psychological issue with the absence of nipples that is unable to be predicted upfront. I discussed risks of surgery including bleeding with possible return to the operating room, dissatisfaction with scars, chronic pain or numbness, possible need for additional procedures for scar revision, need for physical therapy postoperatively and possible change in range of motion at the shoulder, possible heart or lung issues, possible death.  The patient assents to these risks and her power of attorney understands them.

## 2023-03-06 NOTE — Anesthesia Procedure Notes (Signed)
Anesthesia Regional Block: Pectoralis block   Pre-Anesthetic Checklist: , timeout performed,  Correct Patient, Correct Site, Correct Laterality,  Correct Procedure, Correct Position, site marked,  Risks and benefits discussed,  Surgical consent,  Pre-op evaluation,  At surgeon's request and post-op pain management  Laterality: Left  Prep: chloraprep       Needles:  Injection technique: Single-shot  Needle Type: Echogenic Needle     Needle Length: 9cm  Needle Gauge: 21     Additional Needles:   Procedures:,,,, ultrasound used (permanent image in chart),,    Narrative:  Start time: 03/06/2023 12:19 PM End time: 03/06/2023 12:25 PM Injection made incrementally with aspirations every 5 mL.  Performed by: Personally  Anesthesiologist: Santa Lighter, MD  Additional Notes: No pain on injection. No increased resistance to injection. Injection made in 5cc increments.  Good needle visualization.  Patient tolerated procedure well.

## 2023-03-06 NOTE — Plan of Care (Signed)

## 2023-03-06 NOTE — Discharge Instructions (Signed)
[facility] [phone]  MASTECTOMY: POST OP INSTRUCTIONS  Always review your discharge instruction sheet given to you by the facility where your surgery was performed. IF YOU HAVE DISABILITY OR FAMILY LEAVE FORMS, YOU MUST BRING THEM TO THE OFFICE FOR PROCESSING.   DO NOT GIVE THEM TO YOUR DOCTOR. A prescription for pain medication may be given to you upon discharge.  Take your pain medication as prescribed, if needed.  If narcotic pain medicine is not needed, then you may take acetaminophen (Tylenol) or ibuprofen (Advil) as needed. Take your usually prescribed medications unless otherwise directed. If you need a refill on your pain medication, please contact your pharmacy.  They will contact our office to request authorization.  Prescriptions will not be filled after 5pm or on week-ends. You should follow a light diet the first few days after arrival home, such as soup and crackers, etc.  Resume your normal diet the day after surgery. Most patients will experience some swelling and bruising on the chest and underarm.  Ice packs will help.  Swelling and bruising can take several days to resolve.  It is common to experience some constipation if taking pain medication after surgery.  Increasing fluid intake and taking a stool softener (such as Colace) will usually help or prevent this problem from occurring.  A mild laxative (Milk of Magnesia or Miralax) should be taken according to package instructions if there are no bowel movements after 48 hours. Unless discharge instructions indicate otherwise, leave your bandage dry and in place until your next appointment in 3-5 days.  You may take a limited sponge bath.  No tube baths or showers until the drains are removed.  You may have steri-strips (small skin tapes) in place directly over the incision.  These strips should be left on the skin for 7-10 days.  If your surgeon used skin glue on the incision, you may shower in 24 hours.   The glue will flake off over the next 2-3 weeks.  Any sutures or staples will be removed at the office during your follow-up visit. DRAINS:  If you have drains in place, it is important to keep a list of the amount of drainage produced each day in your drains.  Before leaving the hospital, you should be instructed on drain care.  Call our office if you have any questions about your drains. ACTIVITIES:  You may resume regular (light) daily activities beginning the next day--such as daily self-care, walking, climbing stairs--gradually increasing activities as tolerated.  You may have sexual intercourse when it is comfortable.  Refrain from any heavy lifting or straining until approved by your doctor. You may drive when you are no longer taking prescription pain medication, you can comfortably wear a seatbelt, and you can safely maneuver your car and apply brakes. RETURN TO WORK:  __________________________________________________________ You should see your doctor in the office for a follow-up appointment approximately 3-5 days after your surgery.  Your doctor's nurse will typically make your follow-up appointment when she calls you with your pathology report.  Expect your pathology report 2-3 business days after your surgery.  You may call to check if you do not hear from us after three days.   OTHER INSTRUCTIONS: ______________________________________________________________________________________________ ____________________________________________________________________________________________ WHEN TO CALL YOUR DOCTOR: Fever over 101.0 Nausea and/or vomiting Extreme swelling or bruising Continued bleeding from incision. Increased pain, redness, or drainage from the incision. The clinic staff is available to answer your questions during regular business hours.  Please don't hesitate   to call and ask to speak to one of the nurses for clinical concerns.  If you have a medical emergency, go to the  nearest emergency room or call 911.  A surgeon from [facility] is always on call at the hospital. [address], [address], [place], [place]  [address] ? [address] [address], [place], [place]   [address] [phone] ? [phone] ? FAX [phone] Web site: [email or web address]  

## 2023-03-07 ENCOUNTER — Encounter (HOSPITAL_COMMUNITY): Payer: Self-pay | Admitting: General Surgery

## 2023-03-07 DIAGNOSIS — M419 Scoliosis, unspecified: Secondary | ICD-10-CM | POA: Diagnosis not present

## 2023-03-07 DIAGNOSIS — N6001 Solitary cyst of right breast: Secondary | ICD-10-CM | POA: Diagnosis not present

## 2023-03-07 DIAGNOSIS — N6489 Other specified disorders of breast: Secondary | ICD-10-CM | POA: Diagnosis not present

## 2023-03-07 DIAGNOSIS — G8929 Other chronic pain: Secondary | ICD-10-CM | POA: Diagnosis not present

## 2023-03-07 DIAGNOSIS — D242 Benign neoplasm of left breast: Secondary | ICD-10-CM | POA: Diagnosis not present

## 2023-03-07 DIAGNOSIS — D693 Immune thrombocytopenic purpura: Secondary | ICD-10-CM | POA: Diagnosis not present

## 2023-03-07 DIAGNOSIS — M549 Dorsalgia, unspecified: Secondary | ICD-10-CM | POA: Diagnosis not present

## 2023-03-07 DIAGNOSIS — N62 Hypertrophy of breast: Secondary | ICD-10-CM | POA: Diagnosis not present

## 2023-03-07 LAB — CBC
HCT: 33.3 % — ABNORMAL LOW (ref 36.0–46.0)
Hemoglobin: 10.7 g/dL — ABNORMAL LOW (ref 12.0–15.0)
MCH: 32.2 pg (ref 26.0–34.0)
MCHC: 32.1 g/dL (ref 30.0–36.0)
MCV: 100.3 fL — ABNORMAL HIGH (ref 80.0–100.0)
Platelets: 144 10*3/uL — ABNORMAL LOW (ref 150–400)
RBC: 3.32 MIL/uL — ABNORMAL LOW (ref 3.87–5.11)
RDW: 12.8 % (ref 11.5–15.5)
WBC: 8.6 10*3/uL (ref 4.0–10.5)
nRBC: 0 % (ref 0.0–0.2)

## 2023-03-07 LAB — BASIC METABOLIC PANEL
Anion gap: 11 (ref 5–15)
BUN: 11 mg/dL (ref 8–23)
CO2: 23 mmol/L (ref 22–32)
Calcium: 9 mg/dL (ref 8.9–10.3)
Chloride: 104 mmol/L (ref 98–111)
Creatinine, Ser: 0.87 mg/dL (ref 0.44–1.00)
GFR, Estimated: >60 mL/min (ref >60)
Glucose, Bld: 176 mg/dL — ABNORMAL HIGH (ref 70–99)
Potassium: 4.2 mmol/L (ref 3.5–5.1)
Sodium: 138 mmol/L (ref 135–145)

## 2023-03-07 MED ORDER — GABAPENTIN 100 MG PO CAPS
100.0000 mg | ORAL_CAPSULE | Freq: Two times a day (BID) | ORAL | Status: DC
  Administered 2023-03-07: 100 mg via ORAL
  Filled 2023-03-07: qty 1

## 2023-03-07 MED ORDER — DICLOFENAC SODIUM 1 % EX GEL: 2.0000 g | Freq: Three times a day (TID) | CUTANEOUS | Status: DC | PRN

## 2023-03-07 MED ORDER — DIPHENHYDRAMINE HCL 50 MG/ML IJ SOLN: 12.5000 mg | Freq: Four times a day (QID) | INTRAMUSCULAR | Status: DC | PRN

## 2023-03-07 MED ORDER — VENLAFAXINE HCL ER 75 MG PO CP24
75.0000 mg | ORAL_CAPSULE | Freq: Every day | ORAL | Status: DC
  Administered 2023-03-07: 75 mg via ORAL
  Filled 2023-03-07: qty 1

## 2023-03-07 MED ORDER — ONDANSETRON 4 MG PO TBDP: 4.0000 mg | ORAL_TABLET | Freq: Four times a day (QID) | ORAL | Status: DC | PRN

## 2023-03-07 MED ORDER — ONDANSETRON HCL 4 MG/2ML IJ SOLN: 4.0000 mg | Freq: Four times a day (QID) | INTRAMUSCULAR | Status: DC | PRN

## 2023-03-07 MED ORDER — PROCHLORPERAZINE EDISYLATE 10 MG/2ML IJ SOLN: 5.0000 mg | Freq: Four times a day (QID) | INTRAMUSCULAR | Status: DC | PRN

## 2023-03-07 MED ORDER — DIPHENHYDRAMINE HCL 12.5 MG/5ML PO ELIX: 12.5000 mg | ORAL_SOLUTION | Freq: Four times a day (QID) | ORAL | Status: DC | PRN

## 2023-03-07 MED ORDER — VITAMIN D 25 MCG (1000 UNIT) PO TABS
1000.0000 [IU] | ORAL_TABLET | Freq: Every day | ORAL | Status: DC
  Administered 2023-03-07: 1000 [IU] via ORAL
  Filled 2023-03-07: qty 1

## 2023-03-07 MED ORDER — DOCUSATE SODIUM 100 MG PO CAPS
100.0000 mg | ORAL_CAPSULE | Freq: Two times a day (BID) | ORAL | Status: DC
  Administered 2023-03-07: 100 mg via ORAL
  Filled 2023-03-07: qty 1

## 2023-03-07 MED ORDER — TRAMADOL HCL 50 MG PO TABS: 50.0000 mg | ORAL_TABLET | Freq: Four times a day (QID) | ORAL | 0 refills | Status: AC | PRN

## 2023-03-07 MED ORDER — GABAPENTIN 100 MG PO CAPS: 100.0000 mg | ORAL_CAPSULE | Freq: Two times a day (BID) | ORAL | 1 refills | Status: AC

## 2023-03-07 MED ORDER — LORATADINE 10 MG PO TABS
10.0000 mg | ORAL_TABLET | Freq: Every day | ORAL | Status: DC
  Administered 2023-03-07: 10 mg via ORAL
  Filled 2023-03-07: qty 1

## 2023-03-07 MED ORDER — METHOCARBAMOL 500 MG PO TABS: 500.0000 mg | ORAL_TABLET | Freq: Three times a day (TID) | ORAL | 0 refills | Status: AC | PRN

## 2023-03-07 MED ORDER — PROCHLORPERAZINE MALEATE 10 MG PO TABS: 10.0000 mg | ORAL_TABLET | Freq: Four times a day (QID) | ORAL | Status: DC | PRN

## 2023-03-07 MED ORDER — OXYMETAZOLINE HCL 0.05 % NA SOLN: 1.0000 | Freq: Two times a day (BID) | NASAL | Status: DC | PRN

## 2023-03-07 MED ORDER — DOCUSATE SODIUM 100 MG PO CAPS: 100.0000 mg | ORAL_CAPSULE | Freq: Two times a day (BID) | ORAL | 0 refills | Status: AC

## 2023-03-07 MED ORDER — ALBUTEROL SULFATE (2.5 MG/3ML) 0.083% IN NEBU: 2.5000 mg | INHALATION_SOLUTION | Freq: Four times a day (QID) | RESPIRATORY_TRACT | Status: DC | PRN

## 2023-03-07 MED ORDER — MELATONIN 3 MG PO TABS: 3.0000 mg | ORAL_TABLET | Freq: Every evening | ORAL | Status: DC | PRN

## 2023-03-07 MED ORDER — SIMETHICONE 80 MG PO CHEW: 40.0000 mg | CHEWABLE_TABLET | Freq: Four times a day (QID) | ORAL | Status: DC | PRN

## 2023-03-07 MED ORDER — POLYETHYLENE GLYCOL 3350 17 G PO PACK: 17.0000 g | PACK | Freq: Every day | ORAL | Status: DC | PRN

## 2023-03-07 NOTE — Evaluation (Signed)
Physical Therapy Evaluation Patient Details Name: Heather [name] MRN: [id number] DOB: July 16, 1961 Today's Date: 03/07/2023  History of Present Illness  Pt is a 62 y/o female s/p bilateral mastectomies on 03/06/23. PMH: developmental delay, scoliosis  Clinical Impression  Pt presents with post-operative discomfort, otherwise pt is at baseline level of balance, strength, and mobility. Pt ambulatory in the hallway with use of single point cane and quad cane, pt uses quad cane at home but balance is better with use of single point cane. Unsure if pt has one at home, pt states she does not but per [facility] pt has several canes at home so she may already have one. Pt has support of family and friends at d/c, no PT follow up needed at this time. PT to sign off, pt appropraite to d/c from PT standpoint.       Recommendations for follow up therapy are one component of a multi-disciplinary discharge planning process, led by the attending physician.  Recommendations may be updated based on patient status, additional functional criteria and insurance authorization.  Follow Up Recommendations No PT follow up      Assistance Recommended at Discharge Intermittent Supervision/Assistance  Patient can return home with the following  A little help with bathing/dressing/bathroom;A little help with walking and/or transfers    Equipment Recommendations Kasandra Knudsen (pt states she does not have a SPC, per chart review she has several. PT messaged [facility])  Recommendations for Other Services       Functional Status Assessment Patient has not had a recent decline in their functional status     Precautions / Restrictions Precautions Precautions: Fall;Other (comment) Precaution Comments: post op bilateral mastectomy, x2 JP drains Restrictions Weight Bearing Restrictions: No      Mobility  Bed Mobility Overal bed mobility: Modified Independent             General bed mobility comments: increased time, grimacing in  pain with initiating trunk rise and lowering trunk to bed. No physical assist HOB flat    Transfers Overall transfer level: Modified independent Equipment used: Quad cane               General transfer comment: slow to rise, stand x2 from EOB and toilet.    Ambulation/Gait Ambulation/Gait assistance: Supervision Gait Distance (Feet): 125 Feet Assistive device: Quad cane, Straight cane Gait Pattern/deviations: Step-through pattern, Decreased stride length, Drifts right/left Gait velocity: decr     General Gait Details: min cues for sequencing, initial bout of gait with quad cane but transitioned to straight cane with improved steadiness  Stairs Stairs:  (already practiced this admission with OT)          Wheelchair Mobility    Modified Rankin (Stroke Patients Only)       Balance Overall balance assessment: History of Falls, Needs assistance Sitting-balance support: No upper extremity supported, Feet supported Sitting balance-Leahy Scale: Good     Standing balance support: No upper extremity supported, During functional activity Standing balance-Leahy Scale: Fair Standing balance comment: reliant on AD for gait, which is pt baseline                             Pertinent Vitals/Pain Pain Assessment Pain Assessment: Faces Faces Pain Scale: Hurts little more Pain Location: bilat chest Pain Descriptors / Indicators: Grimacing, Discomfort, Sore Pain Intervention(s): Limited activity within patient's tolerance, Monitored during session, Repositioned    Home Living Family/patient expects to be discharged to:: Private  residence Living Arrangements: Non-relatives/Friends (Friend of Mothers and her Son) Available Help at Discharge: Family;Friend(s);Available PRN/intermittently   Home Access: Stairs to enter   Entrance Stairs-Number of Steps: 2-3   Home Layout: One level Home Equipment: Shower seat;Cane - quad;Grab bars - tub/shower Additional  Comments: Day Program (mon-fri)    Prior Function Prior Level of Function : Needs assist               ADLs Comments: Pt receives assistance with simple meal prep such as making coffee. Pt reports she takes the bus to her Day Program.     Hand Dominance   Dominant Hand: Right    Extremity/Trunk Assessment   Upper Extremity Assessment Upper Extremity Assessment: Defer to OT evaluation    Lower Extremity Assessment Lower Extremity Assessment: Generalized weakness RLE Deficits / Details: Unable to bend right knee. Pt reports it has a steel rod and at baseline RLE remains extended.    Cervical / Trunk Assessment Cervical / Trunk Assessment: Other exceptions Cervical / Trunk Exceptions: history of scoliosis  Communication   Communication: No difficulties  Cognition Arousal/Alertness: Awake/alert Behavior During Therapy: WFL for tasks assessed/performed Overall Cognitive Status: History of cognitive impairments - at baseline Area of Impairment: Problem solving                             Problem Solving: Slow processing General Comments: pleasant, follows commands well, eager to progress and d/c home        General Comments      Exercises     Assessment/Plan    PT Assessment Patient does not need any further PT services  PT Problem List         PT Treatment Interventions      PT Goals (Current goals can be found in the Care Plan section)  Acute Rehab PT Goals PT Goal Formulation: With patient Time For Goal Achievement: 03/07/23 Potential to Achieve Goals: Good    Frequency       Co-evaluation               AM-PAC PT "6 Clicks" Mobility  Outcome Measure Help needed turning from your back to your side while in a flat bed without using bedrails?: None Help needed moving from lying on your back to sitting on the side of a flat bed without using bedrails?: None Help needed moving to and from a bed to a chair (including a wheelchair)?:  None Help needed standing up from a chair using your arms (e.g., wheelchair or bedside chair)?: None Help needed to walk in hospital room?: A Little Help needed climbing 3-5 steps with a railing? : A Little 6 Click Score: 22    End of Session   Activity Tolerance: Patient tolerated treatment well Patient left: in chair;with chair alarm set;with call bell/phone within reach Nurse Communication: Mobility status PT Visit Diagnosis: Other abnormalities of gait and mobility (R26.89)    Time: [id number] PT Time Calculation (min) (ACUTE ONLY): 21 min   Charges:   PT Evaluation $PT Eval Low Complexity: 1 Low          Tiea Manninen S, PT DPT Acute Rehabilitation Services Pager [phone]  Office [phone]   Roxine Caddy E Ruffin Pyo 03/07/2023, 10:58 AM

## 2023-03-07 NOTE — Progress Notes (Addendum)
1 Day Post-Op   Subjective/Chief Complaint: Pt did well overnight in terms of pain.  She hasn't had any solid food yet and did wet the bed last night.     Objective: Vital signs in last 24 hours: Temp:  [97.7 F (36.5 C)-98.6 F (37 C)] 98.3 F (36.8 C) (03/13 0500) Pulse Rate:  [69-88] 69 (03/13 0500) Resp:  [10-23] 17 (03/13 0500) BP: (90-130)/(52-74) 90/69 (03/13 0500) SpO2:  [93 %-100 %] 97 % (03/13 0500) Weight:  [60.7 kg] 60.7 kg (03/12 1108)    Intake/Output from previous day: 03/12 0701 - 03/13 0700 In: 800 [P.O.:100; I.V.:700] Out: 473 [Urine:300; Drains:123; Blood:50] Intake/Output this shift: No intake/output data recorded.  General appearance: alert, cooperative, and no distress Resp: breathing comfortably Chest wall: minimal tenderness bilaterally as expected.  No flap hematoma present.    Lab Results:  Recent Labs    03/07/23 0716  WBC 8.6  HGB 10.7*  HCT 33.3*  PLT 144*   BMET No results for input(s): "NA", "K", "CL", "CO2", "GLUCOSE", "BUN", "CREATININE", "CALCIUM" in the last 72 hours. PT/INR No results for input(s): "LABPROT", "INR" in the last 72 hours. ABG No results for input(s): "PHART", "HCO3" in the last 72 hours.  Invalid input(s): "PCO2", "PO2"  Studies/Results: No results found.  Anti-infectives: Anti-infectives (From admission, onward)    Start     Dose/Rate Route Frequency Ordered Stop   03/06/23 2200  ceFAZolin (ANCEF) IVPB 2g/100 mL premix        2 g 200 mL/hr over 30 Minutes Intravenous Every 8 hours 03/06/23 1848 03/06/23 2306   03/06/23 1115  ceFAZolin (ANCEF) IVPB 2g/100 mL premix        2 g 200 mL/hr over 30 Minutes Intravenous On call to O.R. 03/06/23 1108 03/06/23 1324       Assessment/Plan: s/p Procedure(s): BILATERAL TOTAL MASTECTOMY (Bilateral) Pt doing well.  Will need to mobilize. Diet as tolerated. (Has only had clears). Will get OT/PT consult to make sure she is safe at home in terms of mobility and  ADLs.    If tolerates diet and mobility and family has good understanding of drains, possible d/c later today.    LOS: 0 days    Stark Klein 03/07/2023

## 2023-03-07 NOTE — Progress Notes (Signed)
Patient provided discharge paperwork, drainage cups, and abd pads to use for comfort on surgical incision. Medication and discharge paperwork reviewed with [name] and [name].

## 2023-03-07 NOTE — Evaluation (Signed)
Occupational Therapy Evaluation Patient Details Name: Heather [name] MRN: [id number] DOB: Jan 15, 1961 Today's Date: 03/07/2023   History of Present Illness Pt is a 62 y/o female s/p bilateral mastectomies on 03/06/23. PMH: developmental delay, scoliosis   Clinical Impression   Patient evaluated by Occupational Therapy with no further acute OT needs identified. All education has been completed and the patient has no further questions. Pt reports that she lives with family friends and is typically Mod I with all BADL tasks. She attends a Day Program Mon-Fri and takes public transportation to/from program. Patient was instructed today in home exercise program today for post op shoulder range of motion. These included active assisted shoulder flexion in sitting, scapular retraction, wall walking with shoulder abduction, and hands behind head external rotation. They were encouraged to do these twice a day, holding 3 seconds and repeating 5 times when permitted by their physician.   See below for any follow-up Occupational Therapy or equipment needs. OT is signing off. Thank you for this referral.       Recommendations for follow up therapy are one component of a multi-disciplinary discharge planning process, led by the attending physician.  Recommendations may be updated based on patient status, additional functional criteria and insurance authorization.   Follow Up Recommendations  No OT follow up     Assistance Recommended at Discharge PRN  Patient can return home with the following Assist for transportation;Assistance with cooking/housework;Help with stairs or ramp for entrance    Functional Status Assessment  Patient has had a recent decline in their functional status and demonstrates the ability to make significant improvements in function in a reasonable and predictable amount of time.  Equipment Recommendations  None recommended by OT       Precautions / Restrictions  Precautions Precautions: Fall;Other (comment) Precaution Comments: post op bilateral mastectomy, x2 JP drains Restrictions Weight Bearing Restrictions: No      Mobility Bed Mobility Overal bed mobility: Modified Independent     General bed mobility comments: Pt able to transition to seated EOB without physical assist. Increased time needed. HOB evelated. Patient Response: Cooperative  Transfers Overall transfer level: Modified independent Equipment used: Quad cane     General transfer comment: slow to rise using LLE primarily due to ROM limitations of RLE. No physical assist needed.      Balance Overall balance assessment: Mild deficits observed, not formally tested, History of Falls     ADL either performed or assessed with clinical judgement   ADL Overall ADL's : Modified independent;At baseline     General ADL Comments: Received assistance to fix heel on right shoe only. Pt reports that she typically has issues with this.     Vision Baseline Vision/History: 1 Wears glasses Ability to See in Adequate Light: 0 Adequate Patient Visual Report: No change from baseline Vision Assessment?: No apparent visual deficits            Pertinent Vitals/Pain Pain Assessment Pain Assessment: Faces Faces Pain Scale: Hurts little more Pain Location: surgical pain when in bed. Pt reports that it comes and goes. Pain Descriptors / Indicators: Grimacing, Discomfort Pain Intervention(s): Monitored during session, Limited activity within patient's tolerance, Premedicated before session     Hand Dominance Right   Extremity/Trunk Assessment Upper Extremity Assessment Upper Extremity Assessment: Defer to OT evaluation   Lower Extremity Assessment Lower Extremity Assessment: Generalized weakness RLE Deficits / Details: Unable to bend right knee. Pt reports it has a steel rod and at baseline RLE  remains extended.   Cervical / Trunk Assessment Cervical / Trunk Assessment: Other  exceptions Cervical / Trunk Exceptions: history of scoliosis   Communication Communication Communication: No difficulties   Cognition Arousal/Alertness: Awake/alert Behavior During Therapy: WFL for tasks assessed/performed Overall Cognitive Status: History of cognitive impairments - at baseline Area of Impairment: Problem solving         Problem Solving: Slow processing General Comments: Oriented A&O        Exercises Other Exercises Other Exercises: Patient education: HEP for post op shoulder ROM. AA/ROM shoulder flexion in sitting, scapular retraction, wall walks/shoulder abduction, er with hands behind head.        Home Living Family/patient expects to be discharged to:: Private residence Living Arrangements: Non-relatives/Friends (Friend of Mothers and her Son) Available Help at Discharge: Family;Friend(s);Available PRN/intermittently   Home Access: Stairs to enter Entrance Stairs-Number of Steps: 2-3   Home Layout: One level     Bathroom Shower/Tub: Tub/shower unit;Curtain   Biochemist, clinical: Handicapped height     Home Equipment: Shower seat;Cane - quad;Grab bars - tub/shower   Additional Comments: Day Program (mon-fri)      Prior Functioning/Environment Prior Level of Function : Needs assist     ADLs Comments: Pt receives assistance with simple meal prep such as making coffee. Pt reports she takes the bus to her Day Program.        OT Problem List: Pain      OT Treatment/Interventions:   Eval only   OT Goals(Current goals can be found in the care plan section) Acute Rehab OT Goals Patient Stated Goal: to go to [facility] for her birthday  OT Frequency:  Eval only       AM-PAC OT "6 Clicks" Daily Activity     Outcome Measure Help from another person eating meals?: None Help from another person taking care of personal grooming?: None Help from another person toileting, which includes using toliet, bedpan, or urinal?: None Help from another  person bathing (including washing, rinsing, drying)?: None Help from another person to put on and taking off regular upper body clothing?: None Help from another person to put on and taking off regular lower body clothing?: None 6 Click Score: 24   End of Session Equipment Utilized During Treatment: Gait belt;Other (comment) (quad cane) Nurse Communication: Mobility status  Activity Tolerance: Patient tolerated treatment well Patient left: in bed;Other (comment) (hand off with PT)  OT Visit Diagnosis: Muscle weakness (generalized) (M62.81)                Time: [id number] OT Time Calculation (min): 58 min Charges:  OT General Charges $OT Visit: 1 Visit OT Evaluation $OT Eval High Complexity: 1 High OT Treatments $Self Care/Home Management : 8-22 mins $Therapeutic Exercise: 8-22 mins  Ailene Ravel, OTR/L,CBIS  Supplemental OT - [facility] and [facility] Secure Chat Preferred    Tenishia Ekman, Clarene Duke 03/07/2023, 11:03 AM

## 2023-03-07 NOTE — TOC Transition Note (Addendum)
Transition of Care [facility]) - CM/SW Discharge Note   Patient Details  Name: Heather [name] MRN: [id number] Date of Birth: Mar 05, 1961  Transition of Care [facility]) CM/SW Contact:  Carles Collet, RN Phone Number: 03/07/2023, 10:34 AM   Clinical Narrative:     [name] w patient at bedside.  She defers call to [name] with whom she lives. She states that [name] was her mother's friend and she lives with her now.  Spoke to [place] over the phone to discuss [place]. She states they had [facility] in the past. Waiting to hear back from [place] on referral. We discussed her payor as a potential barrier and [name] understands that if accepting [facility] agency cannot be found, I will call her back and let her know.  DME  [name] states that she has multiple rolators and canes, and a shower seat.  JP Drain Discussed this with [name] and she described how to empty, chage and record drainage over the phone. [name] will be by around 11:30 to pick up patient.   *[facility] able to accept with addition of nursing for [facility] to be done w/in acceptable timeline. RN added to [facility] order. RN can assist with drains.   [name],[name]) [phone]     Barriers to Discharge: No Barriers Identified   Patient Goals and CMS Choice CMS Medicare.gov Compare Post Acute Care list provided to:: Other (Comment Required) Choice offered to / list presented to : [facility] POA / [place]  Discharge Placement                         Discharge Plan and Services Additional resources added to the After Visit Summary for     Discharge Planning Services: CM Consult Post Acute Care Choice: Home Health          DME Arranged: N/A         HH Arranged: PT, OT, Nurse's Aide [place] Agency: [place] ([facility]) Date HH Agency Contacted: 03/07/23 Time [place]: 1033 Representative spoke with at [place]: [place] ([place]) Interventions SDOH Screenings    Food Insecurity: Unknown (03/06/2023)  Housing: Low Risk  (03/06/2023)  Transportation Needs: No Transportation Needs (03/06/2023)  Utilities: Not At Risk (03/06/2023)  Tobacco Use: Low Risk  (03/07/2023)     Readmission Risk Interventions     No data to display

## 2023-03-07 NOTE — Anesthesia Postprocedure Evaluation (Signed)
Anesthesia Post Note  Patient: Heather [name]  Procedure(s) Performed: BILATERAL TOTAL MASTECTOMY (Bilateral: Breast)     Patient location during evaluation: PACU Anesthesia Type: General Level of consciousness: awake and alert Pain management: pain level controlled Vital Signs Assessment: post-procedure vital signs reviewed and stable Respiratory status: spontaneous breathing, nonlabored ventilation, respiratory function stable and patient connected to nasal cannula oxygen Cardiovascular status: blood pressure returned to baseline and stable Postop Assessment: no apparent nausea or vomiting Anesthetic complications: no   No notable events documented.  Last Vitals:  Vitals:   03/07/23 0500 03/07/23 0754  BP: 90/69 (!) 91/54  Pulse: 69 65  Resp: 17 16  Temp: 36.8 C 36.8 C  SpO2: 97% 99%    Last Pain:  Vitals:   03/07/23 0900  TempSrc:   PainSc: 0-No pain                 Tiajuana Amass

## 2023-03-08 DIAGNOSIS — N62 Hypertrophy of breast: Secondary | ICD-10-CM | POA: Diagnosis not present

## 2023-03-08 LAB — SURGICAL PATHOLOGY

## 2023-03-11 DIAGNOSIS — N62 Hypertrophy of breast: Secondary | ICD-10-CM | POA: Diagnosis not present

## 2023-03-15 DIAGNOSIS — N62 Hypertrophy of breast: Secondary | ICD-10-CM | POA: Diagnosis not present

## 2023-03-21 DIAGNOSIS — N62 Hypertrophy of breast: Secondary | ICD-10-CM | POA: Diagnosis not present

## 2023-03-22 DIAGNOSIS — N62 Hypertrophy of breast: Secondary | ICD-10-CM | POA: Diagnosis not present

## 2023-03-28 DIAGNOSIS — N62 Hypertrophy of breast: Secondary | ICD-10-CM | POA: Diagnosis not present

## 2023-03-29 DIAGNOSIS — N62 Hypertrophy of breast: Secondary | ICD-10-CM | POA: Diagnosis not present

## 2023-04-04 DIAGNOSIS — N62 Hypertrophy of breast: Secondary | ICD-10-CM | POA: Diagnosis not present

## 2023-04-05 DIAGNOSIS — N62 Hypertrophy of breast: Secondary | ICD-10-CM | POA: Diagnosis not present

## 2023-04-11 ENCOUNTER — Telehealth: Payer: Self-pay

## 2023-04-11 NOTE — Telephone Encounter (Signed)
Received call from [place], pt's caregiver who states since pt's drains were removed s/p bilateral mastectomies, every day she has become more and more fatigue and overall "not feeling well." When prompted about fevers, [name] states she has not checked a temp, but does not think Ms Ovando has a fever and she has not looked at incision. Encouraged [name] to check Ms Gerding's temp and check incision for redness/swelling/drainage and contact Dr Arita Miss office for eval.  [name] is concerned this could be her chronic ITP and asks if she can bring pt in to see MD this week for eval. She is currently scheduled for her 6 mo f/u 5/16. Claudette Head I would make MD aware of request and we would be in touch.

## 2023-04-12 DIAGNOSIS — D72819 Decreased white blood cell count, unspecified: Secondary | ICD-10-CM | POA: Diagnosis not present

## 2023-04-12 DIAGNOSIS — K760 Fatty (change of) liver, not elsewhere classified: Secondary | ICD-10-CM | POA: Diagnosis not present

## 2023-04-12 DIAGNOSIS — G8929 Other chronic pain: Secondary | ICD-10-CM | POA: Diagnosis not present

## 2023-04-12 DIAGNOSIS — M419 Scoliosis, unspecified: Secondary | ICD-10-CM | POA: Diagnosis not present

## 2023-04-12 DIAGNOSIS — M1990 Unspecified osteoarthritis, unspecified site: Secondary | ICD-10-CM | POA: Diagnosis not present

## 2023-04-12 DIAGNOSIS — Z556 Problems related to health literacy: Secondary | ICD-10-CM | POA: Diagnosis not present

## 2023-04-12 DIAGNOSIS — E538 Deficiency of other specified B group vitamins: Secondary | ICD-10-CM | POA: Diagnosis not present

## 2023-04-12 DIAGNOSIS — R531 Weakness: Secondary | ICD-10-CM | POA: Diagnosis not present

## 2023-04-12 DIAGNOSIS — Z9013 Acquired absence of bilateral breasts and nipples: Secondary | ICD-10-CM | POA: Diagnosis not present

## 2023-04-12 DIAGNOSIS — D693 Immune thrombocytopenic purpura: Secondary | ICD-10-CM | POA: Diagnosis not present

## 2023-04-12 DIAGNOSIS — E559 Vitamin D deficiency, unspecified: Secondary | ICD-10-CM | POA: Diagnosis not present

## 2023-04-12 NOTE — Telephone Encounter (Signed)
Hi Heather [name],  Please call her I do not deal with post-surgical related questions Typically, I do not see patient for at least 30 days after surgery I recommend we keep her appt as is

## 2023-04-12 NOTE — Telephone Encounter (Signed)
Called back and given care giver below message. She denies that Heather [name] has had a fever and her incision looks good. Surgery was 3/12. Home health nurse is coming out today and [name] will have Heather [name] assessed. Home health nurse can call Dr. Donell Beers with findings.  [name] was just concerned that Heather [name] is sleeping all the time and complaining of being tired. She was concerned that she needed labs checked due to the history of ITP. She will call the office back if needed after home health visit.

## 2023-04-18 DIAGNOSIS — D72819 Decreased white blood cell count, unspecified: Secondary | ICD-10-CM | POA: Diagnosis not present

## 2023-04-18 DIAGNOSIS — Z9013 Acquired absence of bilateral breasts and nipples: Secondary | ICD-10-CM | POA: Diagnosis not present

## 2023-04-18 DIAGNOSIS — M1990 Unspecified osteoarthritis, unspecified site: Secondary | ICD-10-CM | POA: Diagnosis not present

## 2023-04-18 DIAGNOSIS — D693 Immune thrombocytopenic purpura: Secondary | ICD-10-CM | POA: Diagnosis not present

## 2023-04-18 DIAGNOSIS — K760 Fatty (change of) liver, not elsewhere classified: Secondary | ICD-10-CM | POA: Diagnosis not present

## 2023-04-18 DIAGNOSIS — M419 Scoliosis, unspecified: Secondary | ICD-10-CM | POA: Diagnosis not present

## 2023-04-18 DIAGNOSIS — G8929 Other chronic pain: Secondary | ICD-10-CM | POA: Diagnosis not present

## 2023-04-18 DIAGNOSIS — Z556 Problems related to health literacy: Secondary | ICD-10-CM | POA: Diagnosis not present

## 2023-04-19 DIAGNOSIS — M1990 Unspecified osteoarthritis, unspecified site: Secondary | ICD-10-CM | POA: Diagnosis not present

## 2023-04-19 DIAGNOSIS — D693 Immune thrombocytopenic purpura: Secondary | ICD-10-CM | POA: Diagnosis not present

## 2023-04-19 DIAGNOSIS — Z9013 Acquired absence of bilateral breasts and nipples: Secondary | ICD-10-CM | POA: Diagnosis not present

## 2023-04-19 DIAGNOSIS — G8929 Other chronic pain: Secondary | ICD-10-CM | POA: Diagnosis not present

## 2023-04-19 DIAGNOSIS — K760 Fatty (change of) liver, not elsewhere classified: Secondary | ICD-10-CM | POA: Diagnosis not present

## 2023-04-19 DIAGNOSIS — Z556 Problems related to health literacy: Secondary | ICD-10-CM | POA: Diagnosis not present

## 2023-04-19 DIAGNOSIS — D72819 Decreased white blood cell count, unspecified: Secondary | ICD-10-CM | POA: Diagnosis not present

## 2023-04-19 DIAGNOSIS — M419 Scoliosis, unspecified: Secondary | ICD-10-CM | POA: Diagnosis not present

## 2023-04-25 DIAGNOSIS — M419 Scoliosis, unspecified: Secondary | ICD-10-CM | POA: Diagnosis not present

## 2023-04-25 DIAGNOSIS — M1990 Unspecified osteoarthritis, unspecified site: Secondary | ICD-10-CM | POA: Diagnosis not present

## 2023-04-25 DIAGNOSIS — Z9013 Acquired absence of bilateral breasts and nipples: Secondary | ICD-10-CM | POA: Diagnosis not present

## 2023-04-25 DIAGNOSIS — K760 Fatty (change of) liver, not elsewhere classified: Secondary | ICD-10-CM | POA: Diagnosis not present

## 2023-04-25 DIAGNOSIS — D72819 Decreased white blood cell count, unspecified: Secondary | ICD-10-CM | POA: Diagnosis not present

## 2023-04-25 DIAGNOSIS — Z556 Problems related to health literacy: Secondary | ICD-10-CM | POA: Diagnosis not present

## 2023-04-25 DIAGNOSIS — G8929 Other chronic pain: Secondary | ICD-10-CM | POA: Diagnosis not present

## 2023-04-25 DIAGNOSIS — D693 Immune thrombocytopenic purpura: Secondary | ICD-10-CM | POA: Diagnosis not present

## 2023-05-02 DIAGNOSIS — M1990 Unspecified osteoarthritis, unspecified site: Secondary | ICD-10-CM | POA: Diagnosis not present

## 2023-05-02 DIAGNOSIS — Z9013 Acquired absence of bilateral breasts and nipples: Secondary | ICD-10-CM | POA: Diagnosis not present

## 2023-05-02 DIAGNOSIS — D72819 Decreased white blood cell count, unspecified: Secondary | ICD-10-CM | POA: Diagnosis not present

## 2023-05-02 DIAGNOSIS — Z9011 Acquired absence of right breast and nipple: Secondary | ICD-10-CM | POA: Diagnosis not present

## 2023-05-02 DIAGNOSIS — Z556 Problems related to health literacy: Secondary | ICD-10-CM | POA: Diagnosis not present

## 2023-05-02 DIAGNOSIS — M419 Scoliosis, unspecified: Secondary | ICD-10-CM | POA: Diagnosis not present

## 2023-05-02 DIAGNOSIS — G8929 Other chronic pain: Secondary | ICD-10-CM | POA: Diagnosis not present

## 2023-05-02 DIAGNOSIS — Z9012 Acquired absence of left breast and nipple: Secondary | ICD-10-CM | POA: Diagnosis not present

## 2023-05-02 DIAGNOSIS — K760 Fatty (change of) liver, not elsewhere classified: Secondary | ICD-10-CM | POA: Diagnosis not present

## 2023-05-02 DIAGNOSIS — D693 Immune thrombocytopenic purpura: Secondary | ICD-10-CM | POA: Diagnosis not present

## 2023-05-10 ENCOUNTER — Encounter: Payer: Self-pay | Admitting: Hematology and Oncology

## 2023-05-10 ENCOUNTER — Telehealth: Payer: Self-pay

## 2023-05-10 ENCOUNTER — Inpatient Hospital Stay (HOSPITAL_BASED_OUTPATIENT_CLINIC_OR_DEPARTMENT_OTHER): Payer: 59 | Admitting: Hematology and Oncology

## 2023-05-10 ENCOUNTER — Other Ambulatory Visit: Payer: Self-pay

## 2023-05-10 ENCOUNTER — Inpatient Hospital Stay: Payer: 59 | Attending: Hematology and Oncology

## 2023-05-10 VITALS — BP 105/52 | HR 87 | Temp 98.0°F | Resp 18 | Ht 59.0 in | Wt 126.4 lb

## 2023-05-10 DIAGNOSIS — E538 Deficiency of other specified B group vitamins: Secondary | ICD-10-CM

## 2023-05-10 DIAGNOSIS — D61818 Other pancytopenia: Secondary | ICD-10-CM | POA: Diagnosis not present

## 2023-05-10 LAB — CBC WITH DIFFERENTIAL/PLATELET
Abs Immature Granulocytes: 0.02 10*3/uL (ref 0.00–0.07)
Basophils Absolute: 0 10*3/uL (ref 0.0–0.1)
Basophils Relative: 1 %
Eosinophils Absolute: 0.1 10*3/uL (ref 0.0–0.5)
Eosinophils Relative: 2 %
HCT: 36.9 % (ref 36.0–46.0)
Hemoglobin: 12.2 g/dL (ref 12.0–15.0)
Immature Granulocytes: 0 %
Lymphocytes Relative: 30 %
Lymphs Abs: 1.6 10*3/uL (ref 0.7–4.0)
MCH: 33.5 pg (ref 26.0–34.0)
MCHC: 33.1 g/dL (ref 30.0–36.0)
MCV: 101.4 fL — ABNORMAL HIGH (ref 80.0–100.0)
Monocytes Absolute: 0.3 10*3/uL (ref 0.1–1.0)
Monocytes Relative: 6 %
Neutro Abs: 3.2 10*3/uL (ref 1.7–7.7)
Neutrophils Relative %: 61 %
Platelets: 159 10*3/uL (ref 150–400)
RBC: 3.64 MIL/uL — ABNORMAL LOW (ref 3.87–5.11)
RDW: 13 % (ref 11.5–15.5)
WBC: 5.3 10*3/uL (ref 4.0–10.5)
nRBC: 0 % (ref 0.0–0.2)

## 2023-05-10 NOTE — Assessment & Plan Note (Signed)
CBC is normal today She has transient anemia since her surgery but that has resolved No signs of relapse of ITP I plan to see her again in 6 months

## 2023-05-10 NOTE — Progress Notes (Signed)
[name] OFFICE PROGRESS NOTE  Heather [name], [place]  ASSESSMENT & PLAN:  Pancytopenia, acquired (HCC) CBC is normal today She has transient anemia since her surgery but that has resolved No signs of relapse of ITP I plan to see her again in 6 months  No orders of the defined types were placed in this encounter.   The total time spent in the appointment was 20 minutes encounter with patients including review of chart and various tests results, discussions about plan of care and coordination of care plan   All questions were answered. The patient knows to call the clinic with any problems, questions or concerns. No barriers to learning was detected.    Heather Delay, MD 5/16/202411:25 AM  INTERVAL HISTORY: Heather [name] 62 y.o. female returns for further follow-up with her caregiver She had recent successful bilateral mastectomy Her wound is healing well No recent bleeding issues or infection  SUMMARY OF HEMATOLOGIC HISTORY:  Heather [name] is here because of thrombocytopenia. She has mild mental deficit.  Her background history is not clear. She lives with Heather [name] for 4 years.  She was found to have abnormal CBC from recent blood work. She has normal CBC as of last year. Most recently, repeat CBC show mild leukopenia with a platelet count of 89,000.  That was subsequently repeated and it got even lower to 69,000. She had discovered recent bruising but denies spontaneous epistaxis, hematuria, melena or hematochezia The patient denies history of liver disease, exposure to heparin, history of cardiac murmur/prior cardiovascular surgery or recent Heather medications She had received blood transfusion in the past. Although the patient denies a history of liver disease, ultrasound of the abdomen from April 2012 revealed fatty infiltration of the liver, suspicious for possible fatty liver disease versus other liver condition. She denies recent Heather medications.   She denies recent infection. Further workup revealed evidence of liver lesion on CT imaging and positive ANA screen. She was being observed from the hematology standpoint to progression of pancytopenia On 09/09/2018, she was started on 40 mg of prednisone daily On October 1st, 2019, the dose of prednisone is reduced to 20 mg daily On October 01, 2018, dose of prednisone is reduced to 10 mg daily On October 08, 2018, the dose of prednisone is reduced to 5 mg Bone density scan in November 2019 showed osteopenia with T score of -2.4 On March 06, 2019, the dose of prednisone is reduced to 2.5 mg daily On March 24, 2019, the dose of prednisone is adjusted to 2.5 mg alternate with 5 mg every other day Starting in February, dose of prednisone is reduced to 2.5 mg daily Starting 03/18/2020, prednisone dose is reduced to every other day at 2.5 mg and then twice a week.  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  ALLERGIES:  has No Known Allergies.  MEDICATIONS:  Current Outpatient Medications  Medication Sig Dispense Refill  acetaminophen (TYLENOL) 650 MG CR tablet Take 650-1,300 mg by mouth every 8 (eight) hours as needed for pain (headaches/pain.).     albuterol (VENTOLIN HFA) 108 (90 Base) MCG/ACT inhaler Inhale 1-2 puffs into the lungs every 6 (six) hours as needed.     cetirizine (ZYRTEC) 10 MG tablet Take 10 mg by mouth every  evening.     Cyanocobalamin (VITAMIN B-12 PO) Take 1 tablet by mouth in the morning.     desvenlafaxine (PRISTIQ) 50 MG 24 hr tablet Take 50 mg by mouth daily.     diclofenac Sodium (VOLTAREN) 1 % GEL Apply 2-4 g topically 3 (three) times daily as needed (pain.).     docusate sodium (COLACE) 100 MG capsule Take 1 capsule (100 mg total) by mouth 2 (two) times daily. 10 capsule 0   gabapentin (NEURONTIN) 100 MG capsule Take 1 capsule (100 mg total) by mouth 2 (two) times daily. 30 capsule 1   MELATONIN PO Take 10 mg by mouth at bedtime as needed (sleep).     methocarbamol (ROBAXIN) 500 MG tablet Take 1 tablet (500 mg total) by mouth every 8 (eight) hours as needed for muscle spasms. 20 tablet 0   oxymetazoline (AFRIN) 0.05 % nasal spray Place 1 spray into both nostrils 2 (two) times daily as needed for congestion.     traMADol (ULTRAM) 50 MG tablet Take 1 tablet (50 mg total) by mouth every 6 (six) hours as needed for moderate pain or severe pain. 30 tablet 0   VITAMIN D PO Take 1 tablet by mouth every evening.     No current facility-administered medications for this visit.     REVIEW OF SYSTEMS:   Constitutional: Denies fevers, chills or night sweats Eyes: Denies blurriness of vision Ears, nose, mouth, throat, and face: Denies mucositis or sore throat Respiratory: Denies cough, dyspnea or wheezes Cardiovascular: Denies palpitation, chest discomfort or lower extremity swelling Gastrointestinal:  Denies nausea, heartburn or change in bowel habits Skin: Denies abnormal skin rashes Lymphatics: Denies Heather lymphadenopathy or easy bruising Neurological:Denies numbness, tingling or Heather weaknesses Behavioral/Psych: Mood is stable, no Heather changes  All other systems were reviewed with the patient and are negative.  PHYSICAL EXAMINATION: ECOG PERFORMANCE STATUS: 0 - Asymptomatic  Vitals:   05/10/23 0952  BP: (!) 105/52  Pulse: 87  Resp: 18  Temp: 98 F (36.7 C)  SpO2: 99%   Filed Weights    05/10/23 0952  Weight: 126 lb 6.4 oz (57.3 kg)    GENERAL:alert, no distress and comfortable SKIN: skin color, texture, turgor are normal, no rashes or significant lesions.  Noted well-healed bilateral mastectomy scar NEURO: alert & oriented x 3 with fluent speech, no focal motor/sensory deficits  LABORATORY DATA:  I have reviewed the data as listed     Component Value Date/Time   NA 138 03/07/2023 0716   NA 143 01/03/2018 1108   K 4.2 03/07/2023 0716   CL 104 03/07/2023 0716   CO2 23 03/07/2023 0716   GLUCOSE 176 (H) 03/07/2023 0716   BUN 11 03/07/2023 0716   BUN 19 01/03/2018 1108   CREATININE 0.87 03/07/2023 0716   CREATININE 1.01 (H) 09/09/2018 0913   CREATININE 0.85 08/22/2014 1031   CALCIUM 9.0 03/07/2023 0716   PROT 6.2 (L) 02/28/2023 1100   PROT 6.2 01/03/2018 1108   ALBUMIN 3.5 02/28/2023 1100   ALBUMIN 3.9 01/03/2018 1108   AST 22 02/28/2023 1100   AST 15 04/18/2018 1110   ALT 9 02/28/2023  1100   ALT <6 04/18/2018 1110   ALKPHOS 84 02/28/2023 1100   BILITOT 0.5 02/28/2023 1100   BILITOT 0.3 04/18/2018 1110   GFRNONAA >60 03/07/2023 0716   GFRNONAA 60 (L) 09/09/2018 0913   GFRAA >60 10/15/2018 0823   GFRAA >60 09/09/2018 0913    No results found for: "SPEP", "UPEP"  Lab Results  Component Value Date   WBC 5.3 05/10/2023   NEUTROABS 3.2 05/10/2023   HGB 12.2 05/10/2023   HCT 36.9 05/10/2023   MCV 101.4 (H) 05/10/2023   PLT 159 05/10/2023      Chemistry      Component Value Date/Time   NA 138 03/07/2023 0716   NA 143 01/03/2018 1108   K 4.2 03/07/2023 0716   CL 104 03/07/2023 0716   CO2 23 03/07/2023 0716   BUN 11 03/07/2023 0716   BUN 19 01/03/2018 1108   CREATININE 0.87 03/07/2023 0716   CREATININE 1.01 (H) 09/09/2018 0913   CREATININE 0.85 08/22/2014 1031      Component Value Date/Time   CALCIUM 9.0 03/07/2023 0716   ALKPHOS 84 02/28/2023 1100   AST 22 02/28/2023 1100   AST 15 04/18/2018 1110   ALT 9 02/28/2023 1100   ALT <6  04/18/2018 1110   BILITOT 0.5 02/28/2023 1100   BILITOT 0.3 04/18/2018 1110

## 2023-05-10 NOTE — Telephone Encounter (Signed)
Mailed out 5/16 office note to [name], caregiver at address listed in the chart, per Dr. Bertis Ruddy.

## 2023-05-24 DIAGNOSIS — Z9012 Acquired absence of left breast and nipple: Secondary | ICD-10-CM | POA: Diagnosis not present

## 2023-05-24 DIAGNOSIS — Z9011 Acquired absence of right breast and nipple: Secondary | ICD-10-CM | POA: Diagnosis not present

## 2023-08-20 DIAGNOSIS — H25013 Cortical age-related cataract, bilateral: Secondary | ICD-10-CM | POA: Diagnosis not present

## 2023-08-20 DIAGNOSIS — H25043 Posterior subcapsular polar age-related cataract, bilateral: Secondary | ICD-10-CM | POA: Diagnosis not present

## 2023-08-20 DIAGNOSIS — H2513 Age-related nuclear cataract, bilateral: Secondary | ICD-10-CM | POA: Diagnosis not present

## 2023-10-10 DIAGNOSIS — M4186 Other forms of scoliosis, lumbar region: Secondary | ICD-10-CM | POA: Diagnosis not present

## 2023-10-10 DIAGNOSIS — E559 Vitamin D deficiency, unspecified: Secondary | ICD-10-CM | POA: Diagnosis not present

## 2023-10-10 DIAGNOSIS — Z131 Encounter for screening for diabetes mellitus: Secondary | ICD-10-CM | POA: Diagnosis not present

## 2023-10-10 DIAGNOSIS — E782 Mixed hyperlipidemia: Secondary | ICD-10-CM | POA: Diagnosis not present

## 2023-10-10 DIAGNOSIS — Z0000 Encounter for general adult medical examination without abnormal findings: Secondary | ICD-10-CM | POA: Diagnosis not present

## 2023-10-10 DIAGNOSIS — I69319 Unspecified symptoms and signs involving cognitive functions following cerebral infarction: Secondary | ICD-10-CM | POA: Diagnosis not present

## 2023-10-10 DIAGNOSIS — Z23 Encounter for immunization: Secondary | ICD-10-CM | POA: Diagnosis not present

## 2023-10-10 DIAGNOSIS — R251 Tremor, unspecified: Secondary | ICD-10-CM | POA: Diagnosis not present

## 2023-10-10 DIAGNOSIS — D696 Thrombocytopenia, unspecified: Secondary | ICD-10-CM | POA: Diagnosis not present

## 2023-10-29 DIAGNOSIS — H25813 Combined forms of age-related cataract, bilateral: Secondary | ICD-10-CM | POA: Diagnosis not present

## 2023-11-15 ENCOUNTER — Telehealth: Payer: Self-pay

## 2023-11-15 ENCOUNTER — Inpatient Hospital Stay: Payer: 59

## 2023-11-15 ENCOUNTER — Inpatient Hospital Stay: Payer: 59 | Attending: Hematology and Oncology | Admitting: Hematology and Oncology

## 2023-11-15 ENCOUNTER — Encounter: Payer: Self-pay | Admitting: Hematology and Oncology

## 2023-11-15 VITALS — BP 117/61 | HR 72 | Temp 97.5°F | Resp 18 | Ht 59.0 in | Wt 126.6 lb

## 2023-11-15 DIAGNOSIS — E538 Deficiency of other specified B group vitamins: Secondary | ICD-10-CM

## 2023-11-15 DIAGNOSIS — H539 Unspecified visual disturbance: Secondary | ICD-10-CM | POA: Diagnosis not present

## 2023-11-15 DIAGNOSIS — D61818 Other pancytopenia: Secondary | ICD-10-CM

## 2023-11-15 LAB — CBC WITH DIFFERENTIAL/PLATELET
Abs Immature Granulocytes: 0 10*3/uL (ref 0.00–0.07)
Basophils Absolute: 0 10*3/uL (ref 0.0–0.1)
Basophils Relative: 0 %
Eosinophils Absolute: 0 10*3/uL (ref 0.0–0.5)
Eosinophils Relative: 0 %
HCT: 38.2 % (ref 36.0–46.0)
Hemoglobin: 12 g/dL (ref 12.0–15.0)
Immature Granulocytes: 0 %
Lymphocytes Relative: 30 %
Lymphs Abs: 0.9 10*3/uL (ref 0.7–4.0)
MCH: 31.4 pg (ref 26.0–34.0)
MCHC: 31.4 g/dL (ref 30.0–36.0)
MCV: 100 fL (ref 80.0–100.0)
Monocytes Absolute: 0.2 10*3/uL (ref 0.1–1.0)
Monocytes Relative: 6 %
Neutro Abs: 2 10*3/uL (ref 1.7–7.7)
Neutrophils Relative %: 64 %
Platelets: 129 10*3/uL — ABNORMAL LOW (ref 150–400)
RBC: 3.82 MIL/uL — ABNORMAL LOW (ref 3.87–5.11)
RDW: 13.3 % (ref 11.5–15.5)
WBC: 3.1 10*3/uL — ABNORMAL LOW (ref 4.0–10.5)
nRBC: 0 % (ref 0.0–0.2)

## 2023-11-15 NOTE — Assessment & Plan Note (Signed)
She had recent visual changes and was noted to have cataracts She is scheduled for cataract surgery in January There is no contraindication for her to proceed from my perspective

## 2023-11-15 NOTE — Assessment & Plan Note (Addendum)
She has transient intermittent leukopenia but not symptomatic She has very mild thrombocytopenia but not symptomatic Overall, she does not need additional treatment I plan to see her again in 6 months

## 2023-11-15 NOTE — Telephone Encounter (Signed)
-----   Message from Artis Delay sent at 11/15/2023 10:07 AM EST ----- Pls send [name] a copy of my notes

## 2023-11-15 NOTE — Telephone Encounter (Signed)
Mailed today's office note to [place]. Placed in mail.

## 2023-11-15 NOTE — Progress Notes (Signed)
[name] OFFICE PROGRESS NOTE  Heather [name], [place]  ASSESSMENT & PLAN:  Pancytopenia, acquired [facility]) She has transient intermittent leukopenia but not symptomatic She has very mild thrombocytopenia but not symptomatic Overall, she does not need additional treatment I plan to see her again in 6 months  Visual changes She had recent visual changes and was noted to have cataracts She is scheduled for cataract surgery in January There is no contraindication for her to proceed from my perspective  No orders of the defined types were placed in this encounter.   The total time spent in the appointment was 20 minutes encounter with patients including review of chart and various tests results, discussions about plan of care and coordination of care plan   All questions were answered. The patient knows to call the clinic with any problems, questions or concerns. No barriers to learning was detected.    Heather Delay, MD 11/21/202410:07 AM  INTERVAL HISTORY: Heather [name] 62 y.o. female returns for further follow-up for history of ITP She is doing well She was treated with metronidazole several weeks ago for vaginal infection that has since resolved No recent bleeding She has upcoming cataract surgery next year  SUMMARY OF HEMATOLOGIC HISTORY:  [name] is here because of thrombocytopenia. She has mild mental deficit.  Her background history is not clear. She lives with Heather [name] for 4 years.  She was found to have abnormal CBC from recent blood work. She has normal CBC as of last year. Most recently, repeat CBC show mild leukopenia with a platelet count of 89,000.  That was subsequently repeated and it got even lower to 69,000. She had discovered recent bruising but denies spontaneous epistaxis, hematuria, melena or hematochezia The patient denies history of liver disease, exposure to heparin, history of cardiac murmur/prior cardiovascular surgery or  recent new medications She had received blood transfusion in the past. Although the patient denies a history of liver disease, ultrasound of the abdomen from April 2012 revealed fatty infiltration of the liver, suspicious for possible fatty liver disease versus other liver condition. She denies recent new medications.  She denies recent infection. Further workup revealed evidence of liver lesion on CT imaging and positive ANA screen. She was being observed from the hematology standpoint to progression of pancytopenia On 09/09/2018, she was started on 40 mg of prednisone daily On October 1st, 2019, the dose of prednisone is reduced to 20 mg daily On October 01, 2018, dose of prednisone is reduced to 10 mg daily On October 08, 2018, the dose of prednisone is reduced to 5 mg Bone density scan in November 2019 showed osteopenia with T score of -2.4 On March 06, 2019, the dose of prednisone is reduced to 2.5 mg daily On March 24, 2019, the dose of prednisone is adjusted to 2.5 mg alternate with 5 mg every other day Starting in February, dose of prednisone is reduced to 2.5 mg daily Starting 03/18/2020, prednisone dose is reduced to every other day at 2.5 mg and then twice a week.  She was found to have vitamin B12 deficiency and started taking oral vitamin B12 supplementation On 03/17/2021, she underwent bone marrow aspirate and biopsy which showed mild hypocellular bone marrow without dysplasia In May 2022, she received 4 doses of rituximab and is maintained on 2.5 mg prednisone twice a week Starting late 2022, she is maintained on only 2.5 mg of prednisone once a week October 05, 2022, decision is made to  stop prednisone completely  I have reviewed the past medical history, past surgical history, social history and family history with the patient and they are unchanged from previous note.  ALLERGIES:  has No Known Allergies.  MEDICATIONS:  Current Outpatient Medications  Medication Sig Dispense  Refill   acetaminophen (TYLENOL) 650 MG CR tablet Take 650-1,300 mg by mouth every 8 (eight) hours as needed for pain (headaches/pain.).     albuterol (VENTOLIN HFA) 108 (90 Base) MCG/ACT inhaler Inhale 1-2 puffs into the lungs every 6 (six) hours as needed.     cetirizine (ZYRTEC) 10 MG tablet Take 10 mg by mouth every evening.     Cyanocobalamin (VITAMIN B-12 PO) Take 1 tablet by mouth in the morning.     desvenlafaxine (PRISTIQ) 50 MG 24 hr tablet Take 50 mg by mouth daily.     diclofenac Sodium (VOLTAREN) 1 % GEL Apply 2-4 g topically 3 (three) times daily as needed (pain.).     docusate sodium (COLACE) 100 MG capsule Take 1 capsule (100 mg total) by mouth 2 (two) times daily. 10 capsule 0   gabapentin (NEURONTIN) 100 MG capsule Take 1 capsule (100 mg total) by mouth 2 (two) times daily. 30 capsule 1   MELATONIN PO Take 10 mg by mouth at bedtime as needed (sleep).     methocarbamol (ROBAXIN) 500 MG tablet Take 1 tablet (500 mg total) by mouth every 8 (eight) hours as needed for muscle spasms. 20 tablet 0   oxymetazoline (AFRIN) 0.05 % nasal spray Place 1 spray into both nostrils 2 (two) times daily as needed for congestion.     traMADol (ULTRAM) 50 MG tablet Take 1 tablet (50 mg total) by mouth every 6 (six) hours as needed for moderate pain or severe pain. 30 tablet 0   VITAMIN D PO Take 1 tablet by mouth every evening.     No current facility-administered medications for this visit.     REVIEW OF SYSTEMS:   All other systems were reviewed with the patient and are negative.  PHYSICAL EXAMINATION: ECOG PERFORMANCE STATUS: 0 - Asymptomatic  Vitals:   11/15/23 0956  BP: 117/61  Pulse: 72  Resp: 18  Temp: (!) 97.5 F (36.4 C)  SpO2: 100%   Filed Weights   11/15/23 0956  Weight: 126 lb 9.6 oz (57.4 kg)    GENERAL:alert, no distress and comfortable  LABORATORY DATA:  I have reviewed the data as listed     Component Value Date/Time   NA 138 03/07/2023 0716   NA 143  01/03/2018 1108   K 4.2 03/07/2023 0716   CL 104 03/07/2023 0716   CO2 23 03/07/2023 0716   GLUCOSE 176 (H) 03/07/2023 0716   BUN 11 03/07/2023 0716   BUN 19 01/03/2018 1108   CREATININE 0.87 03/07/2023 0716   CREATININE 1.01 (H) 09/09/2018 0913   CREATININE 0.85 08/22/2014 1031   CALCIUM 9.0 03/07/2023 0716   PROT 6.2 (L) 02/28/2023 1100   PROT 6.2 01/03/2018 1108   ALBUMIN 3.5 02/28/2023 1100   ALBUMIN 3.9 01/03/2018 1108   AST 22 02/28/2023 1100   AST 15 04/18/2018 1110   ALT 9 02/28/2023 1100   ALT <6 04/18/2018 1110   ALKPHOS 84 02/28/2023 1100   BILITOT 0.5 02/28/2023 1100   BILITOT 0.3 04/18/2018 1110   GFRNONAA >60 03/07/2023 0716   GFRNONAA 60 (L) 09/09/2018 0913   GFRAA >60 10/15/2018 0823   GFRAA >60 09/09/2018 0913    No results found  for: "SPEP", "UPEP"  Lab Results  Component Value Date   WBC 3.1 (L) 11/15/2023   NEUTROABS 2.0 11/15/2023   HGB 12.0 11/15/2023   HCT 38.2 11/15/2023   MCV 100.0 11/15/2023   PLT 129 (L) 11/15/2023      Chemistry      Component Value Date/Time   NA 138 03/07/2023 0716   NA 143 01/03/2018 1108   K 4.2 03/07/2023 0716   CL 104 03/07/2023 0716   CO2 23 03/07/2023 0716   BUN 11 03/07/2023 0716   BUN 19 01/03/2018 1108   CREATININE 0.87 03/07/2023 0716   CREATININE 1.01 (H) 09/09/2018 0913   CREATININE 0.85 08/22/2014 1031      Component Value Date/Time   CALCIUM 9.0 03/07/2023 0716   ALKPHOS 84 02/28/2023 1100   AST 22 02/28/2023 1100   AST 15 04/18/2018 1110   ALT 9 02/28/2023 1100   ALT <6 04/18/2018 1110   BILITOT 0.5 02/28/2023 1100   BILITOT 0.3 04/18/2018 1110

## 2023-12-14 DIAGNOSIS — I69319 Unspecified symptoms and signs involving cognitive functions following cerebral infarction: Secondary | ICD-10-CM | POA: Diagnosis not present

## 2023-12-14 DIAGNOSIS — E782 Mixed hyperlipidemia: Secondary | ICD-10-CM | POA: Diagnosis not present

## 2023-12-14 DIAGNOSIS — D696 Thrombocytopenia, unspecified: Secondary | ICD-10-CM | POA: Diagnosis not present

## 2023-12-14 DIAGNOSIS — M4186 Other forms of scoliosis, lumbar region: Secondary | ICD-10-CM | POA: Diagnosis not present

## 2023-12-14 DIAGNOSIS — Z0000 Encounter for general adult medical examination without abnormal findings: Secondary | ICD-10-CM | POA: Diagnosis not present

## 2023-12-31 DIAGNOSIS — H25811 Combined forms of age-related cataract, right eye: Secondary | ICD-10-CM | POA: Diagnosis not present

## 2024-01-08 DIAGNOSIS — Z79899 Other long term (current) drug therapy: Secondary | ICD-10-CM | POA: Diagnosis not present

## 2024-01-08 DIAGNOSIS — H25811 Combined forms of age-related cataract, right eye: Secondary | ICD-10-CM | POA: Diagnosis not present

## 2024-01-08 DIAGNOSIS — D693 Immune thrombocytopenic purpura: Secondary | ICD-10-CM | POA: Diagnosis not present

## 2024-01-09 DIAGNOSIS — Z961 Presence of intraocular lens: Secondary | ICD-10-CM | POA: Diagnosis not present

## 2024-01-09 DIAGNOSIS — Z9841 Cataract extraction status, right eye: Secondary | ICD-10-CM | POA: Diagnosis not present

## 2024-01-09 DIAGNOSIS — H25812 Combined forms of age-related cataract, left eye: Secondary | ICD-10-CM | POA: Diagnosis not present

## 2024-01-23 DIAGNOSIS — Z9841 Cataract extraction status, right eye: Secondary | ICD-10-CM | POA: Diagnosis not present

## 2024-01-23 DIAGNOSIS — H25812 Combined forms of age-related cataract, left eye: Secondary | ICD-10-CM | POA: Diagnosis not present

## 2024-01-23 DIAGNOSIS — Z961 Presence of intraocular lens: Secondary | ICD-10-CM | POA: Diagnosis not present

## 2024-03-13 DIAGNOSIS — I69319 Unspecified symptoms and signs involving cognitive functions following cerebral infarction: Secondary | ICD-10-CM | POA: Diagnosis not present

## 2024-03-13 DIAGNOSIS — M4186 Other forms of scoliosis, lumbar region: Secondary | ICD-10-CM | POA: Diagnosis not present

## 2024-03-13 DIAGNOSIS — E782 Mixed hyperlipidemia: Secondary | ICD-10-CM | POA: Diagnosis not present

## 2024-03-13 DIAGNOSIS — D696 Thrombocytopenia, unspecified: Secondary | ICD-10-CM | POA: Diagnosis not present

## 2024-03-26 DIAGNOSIS — H25812 Combined forms of age-related cataract, left eye: Secondary | ICD-10-CM | POA: Diagnosis not present

## 2024-03-26 DIAGNOSIS — H26491 Other secondary cataract, right eye: Secondary | ICD-10-CM | POA: Diagnosis not present

## 2024-03-26 DIAGNOSIS — Z9841 Cataract extraction status, right eye: Secondary | ICD-10-CM | POA: Diagnosis not present

## 2024-03-26 DIAGNOSIS — Z961 Presence of intraocular lens: Secondary | ICD-10-CM | POA: Diagnosis not present

## 2024-05-15 ENCOUNTER — Inpatient Hospital Stay: Payer: 59 | Attending: Hematology and Oncology | Admitting: Hematology and Oncology

## 2024-05-15 ENCOUNTER — Encounter: Payer: Self-pay | Admitting: Hematology and Oncology

## 2024-05-15 ENCOUNTER — Inpatient Hospital Stay: Payer: 59

## 2024-05-15 VITALS — BP 114/68 | HR 84 | Temp 97.8°F | Resp 18 | Ht 59.0 in | Wt 137.2 lb

## 2024-05-15 DIAGNOSIS — D693 Immune thrombocytopenic purpura: Secondary | ICD-10-CM

## 2024-05-15 DIAGNOSIS — D61818 Other pancytopenia: Secondary | ICD-10-CM

## 2024-05-15 DIAGNOSIS — E538 Deficiency of other specified B group vitamins: Secondary | ICD-10-CM

## 2024-05-15 DIAGNOSIS — D72819 Decreased white blood cell count, unspecified: Secondary | ICD-10-CM

## 2024-05-15 LAB — CBC WITH DIFFERENTIAL/PLATELET
Abs Immature Granulocytes: 0.01 10*3/uL (ref 0.00–0.07)
Basophils Absolute: 0 10*3/uL (ref 0.0–0.1)
Basophils Relative: 0 %
Eosinophils Absolute: 0 10*3/uL (ref 0.0–0.5)
Eosinophils Relative: 0 %
HCT: 37 % (ref 36.0–46.0)
Hemoglobin: 12.1 g/dL (ref 12.0–15.0)
Immature Granulocytes: 0 %
Lymphocytes Relative: 28 %
Lymphs Abs: 1 10*3/uL (ref 0.7–4.0)
MCH: 31.3 pg (ref 26.0–34.0)
MCHC: 32.7 g/dL (ref 30.0–36.0)
MCV: 95.9 fL (ref 80.0–100.0)
Monocytes Absolute: 0.2 10*3/uL (ref 0.1–1.0)
Monocytes Relative: 6 %
Neutro Abs: 2.3 10*3/uL (ref 1.7–7.7)
Neutrophils Relative %: 66 %
Platelets: 161 10*3/uL (ref 150–400)
RBC: 3.86 MIL/uL — ABNORMAL LOW (ref 3.87–5.11)
RDW: 12.9 % (ref 11.5–15.5)
WBC: 3.6 10*3/uL — ABNORMAL LOW (ref 4.0–10.5)
nRBC: 0 % (ref 0.0–0.2)

## 2024-05-15 NOTE — Assessment & Plan Note (Addendum)
 She has not needed treatment for over a year I will see her once a year for now

## 2024-05-15 NOTE — Assessment & Plan Note (Addendum)
 She is not symptomatic She has chronic leukopenia could be associated with autoimmune condition Observe for now

## 2024-05-15 NOTE — Progress Notes (Signed)
 [name] OFFICE PROGRESS NOTE  Patient Care Team: Dianah Fort, [place] as PCP - General (Physician Assistant) Corie Diamond, MD as Consulting Physician (Ophthalmology)  Assessment & Plan Chronic ITP (idiopathic thrombocytopenia) [facility]) She has not needed treatment for over a year I will see her once a year for now Chronic leukopenia She is not symptomatic She has chronic leukopenia could be associated with autoimmune condition Observe for now  No orders of the defined types were placed in this encounter.    Heather Jacobs, MD  INTERVAL HISTORY: she returns for surveillance follow-up for recurrent ITP Patient denies recent bleeding such as epistaxis, hematuria or hematochezia We reviewed medication list and discussed medication changes We discussed test results and future plan of care as outlined above  PHYSICAL EXAMINATION: ECOG PERFORMANCE STATUS: 0 - Asymptomatic  Vitals:   05/15/24 0923  BP: 114/68  Pulse: 84  Resp: 18  Temp: 97.8 F (36.6 C)  SpO2: 100%   Lab Results  Component Value Date   WBC 3.6 (L) 05/15/2024   HGB 12.1 05/15/2024   HCT 37.0 05/15/2024   MCV 95.9 05/15/2024   PLT 161 05/15/2024   SUMMARY OF HEMATOLOGIC HISTORY:  Heather  K [name] is here because of thrombocytopenia. She has mild mental deficit.  Her background history is not clear. She lives with Heather [name] for 4 years.  She was found to have abnormal CBC from recent blood work. She has normal CBC as of last year. Most recently, repeat CBC show mild leukopenia with a platelet count of 89,000.  That was subsequently repeated and it got even lower to 69,000. She had discovered recent bruising but denies spontaneous epistaxis, hematuria, melena or hematochezia The patient denies history of liver disease, exposure to heparin, history of cardiac murmur/prior cardiovascular surgery or recent new medications She had received blood transfusion in the past. Although the patient  denies a history of liver disease, ultrasound of the abdomen from April 2012 revealed fatty infiltration of the liver, suspicious for possible fatty liver disease versus other liver condition. She denies recent new medications.  She denies recent infection. Further workup revealed evidence of liver lesion on CT imaging and positive ANA screen. She was being observed from the hematology standpoint to progression of pancytopenia On 09/09/2018, she was started on 40 mg of prednisone  daily On October 1st, 2019, the dose of prednisone  is reduced to 20 mg daily On October 01, 2018, dose of prednisone  is reduced to 10 mg daily On October 08, 2018, the dose of prednisone  is reduced to 5 mg Bone density scan in November 2019 showed osteopenia with T score of -2.4 On March 06, 2019, the dose of prednisone  is reduced to 2.5 mg daily On March 24, 2019, the dose of prednisone  is adjusted to 2.5 mg alternate with 5 mg every other day Starting in February, dose of prednisone  is reduced to 2.5 mg daily Starting 03/18/2020, prednisone  dose is reduced to every other day at 2.5 mg and then twice a week.  She was found to have vitamin B12 deficiency and started taking oral vitamin B12 supplementation On 03/17/2021, she underwent bone marrow aspirate and biopsy which showed mild hypocellular bone marrow without dysplasia In May 2022, she received 4 doses of rituximab  and is maintained on 2.5 mg prednisone  twice a week Starting late 2022, she is maintained on only 2.5 mg of prednisone  once a week October 05, 2022, decision is made to stop prednisone  completely

## 2024-06-13 DIAGNOSIS — E782 Mixed hyperlipidemia: Secondary | ICD-10-CM | POA: Diagnosis not present

## 2024-06-13 DIAGNOSIS — M4186 Other forms of scoliosis, lumbar region: Secondary | ICD-10-CM | POA: Diagnosis not present

## 2024-06-13 DIAGNOSIS — D696 Thrombocytopenia, unspecified: Secondary | ICD-10-CM | POA: Diagnosis not present

## 2024-06-13 DIAGNOSIS — I69319 Unspecified symptoms and signs involving cognitive functions following cerebral infarction: Secondary | ICD-10-CM | POA: Diagnosis not present

## 2024-09-12 DIAGNOSIS — I69319 Unspecified symptoms and signs involving cognitive functions following cerebral infarction: Secondary | ICD-10-CM | POA: Diagnosis not present

## 2024-09-12 DIAGNOSIS — M4186 Other forms of scoliosis, lumbar region: Secondary | ICD-10-CM | POA: Diagnosis not present

## 2024-09-12 DIAGNOSIS — Z131 Encounter for screening for diabetes mellitus: Secondary | ICD-10-CM | POA: Diagnosis not present

## 2024-09-12 DIAGNOSIS — D696 Thrombocytopenia, unspecified: Secondary | ICD-10-CM | POA: Diagnosis not present

## 2024-09-12 DIAGNOSIS — E782 Mixed hyperlipidemia: Secondary | ICD-10-CM | POA: Diagnosis not present

## 2024-09-12 DIAGNOSIS — Z23 Encounter for immunization: Secondary | ICD-10-CM | POA: Diagnosis not present

## 2024-09-12 DIAGNOSIS — Z0000 Encounter for general adult medical examination without abnormal findings: Secondary | ICD-10-CM | POA: Diagnosis not present

## 2024-10-14 ENCOUNTER — Ambulatory Visit: Attending: Physician Assistant

## 2024-10-14 NOTE — Therapy (Incomplete)
 OUTPATIENT PHYSICAL THERAPY THORACOLUMBAR EVALUATION   Patient Name: Heather [name] MRN: [id number] DOB:1961/05/23, 63 y.o., female Today's Date: 10/14/2024  END OF SESSION:   Past Medical History:  Diagnosis Date   Allergy    Anxiety    history restored after error with record merge       Arthritis    history restored after error with record merge       Complication of anesthesia    slow to wake up   Depression    Fatty infiltration of liver    history restored after error with record merge       Headache    Intestinal obstruction (HCC)    as an infant history restored after error with record merge       Mental retardation    history restored after error with record merge       Past Surgical History:  Procedure Laterality Date   ABDOMINAL HYSTERECTOMY     COLONOSCOPY     fused knee     KNEE DISLOCATION SURGERY     POLYPECTOMY     SIGMOIDOSCOPY     TOTAL MASTECTOMY Bilateral 03/06/2023   Procedure: BILATERAL TOTAL MASTECTOMY;  Surgeon: Aron Shoulders, MD;  Location: [facility] OR;  Service: General;  Laterality: Bilateral;   Patient Active Problem List   Diagnosis Date Noted   Visual changes 11/15/2023   Symptomatic mammary hypertrophy 09/08/2022   Back pain 09/08/2022   Chronic leukopenia 04/07/2021   Vitamin B12 deficiency 03/18/2020   Preventive measure 03/04/2020   Macrocytosis 09/05/2019   Tremor of both hands 09/05/2019   Rash 09/05/2019   Weight loss 10/08/2018   Lesion of liver 01/24/2018   Chronic ITP (idiopathic thrombocytopenia) (HCC) 01/24/2018   Positive ANA (antinuclear antibody) 01/24/2018   Thrombocytopenia 01/10/2018   Fatty liver disease, nonalcoholic 01/10/2018   Pancytopenia, acquired (HCC) 01/10/2018   Osteopenia 12/02/2016   Scoliosis 06/15/2016   BMI 30.0-30.9,adult 06/15/2016   RIGHT knee fusion 06/15/2016   Arthritis of knee 09/17/2014   Arthritis 08/26/2014   Anxiety state 08/26/2014   Migraine 08/26/2014   Intellectual disability  08/26/2014    PCP:  Rosalea Rosina SAILOR, PA     REFERRING PROVIDER:  Rosalea Rosina SAILOR, PA     REFERRING DIAG: Back pain with Scoliosis   Rationale for Evaluation and Treatment: Rehabilitation  THERAPY DIAG:  No diagnosis found.  ONSET DATE: ***  SUBJECTIVE:  SUBJECTIVE STATEMENT: ***  PERTINENT HISTORY:  ***  PAIN:  Are you having pain? Yes: NPRS scale: *** Pain location: *** Pain description: *** Aggravating factors: *** Relieving factors: ***  PRECAUTIONS: {Therapy precautions:24002}  RED FLAGS: {PT Red Flags:29287}   WEIGHT BEARING RESTRICTIONS: {Yes ***/No:24003}  FALLS:  Has patient fallen in last 6 months? {fallsyesno:27318}  LIVING ENVIRONMENT: Lives with: {[facility] lives with:25569::lives with their family} Lives in: {Lives in:25570} Stairs: {opstairs:27293} Has following equipment at home: {Assistive devices:23999}  OCCUPATION: ***  PLOF: {PLOF:24004}  PATIENT GOALS: ***  NEXT MD VISIT: ***  OBJECTIVE:  Note: Objective measures were completed at Evaluation unless otherwise noted.  DIAGNOSTIC FINDINGS:  No relevant imaging results available in epic; none included in referral    PATIENT SURVEYS:  Modified Oswestry:  MODIFIED OSWESTRY DISABILITY SCALE  Date: 10/14/2024 Score  Pain intensity {ODI 1:32962}  2. Personal care (washing, dressing, etc.) {ODI 2:32963}  3. Lifting {ODI 3:32964}  4. Walking {ODI 4:32965}  5. Sitting {ODI 5:32966}  6. Standing {ODI 6:32967}  7. Sleeping {ODI 7:32968}  8. Social Life {ODI 8:32969}  9. Traveling {ODI 9:32970}  10. Employment/ Homemaking {ODI 10:32971}  Total ***/50   Interpretation of scores: Score Category Description  0-20% Minimal Disability The patient can cope with most living activities. Usually no  treatment is indicated apart from advice on lifting, sitting and exercise  21-40% Moderate Disability The patient experiences more pain and difficulty with sitting, lifting and standing. Travel and social life are more difficult and they may be disabled from work. Personal care, sexual activity and sleeping are not grossly affected, and the patient can usually be managed by conservative means  41-60% Severe Disability Pain remains the main problem in this group, but activities of daily living are affected. These patients require a detailed investigation  61-80% Crippled Back pain impinges on all aspects of the patient's life. Positive intervention is required  81-100% Bed-bound These patients are either bed-bound or exaggerating their symptoms  Bluford FORBES Zoe DELENA Karon DELENA, et al. Surgery versus conservative management of stable thoracolumbar fracture: the PRESTO feasibility RCT. [place] ([place]): [employer]; 2021 Nov. [facility] Technology Assessment, No. 25.62.) Appendix 3, Oswestry Disability Index category descriptors. Available from: [employer]el[employer]cz  Minimally Clinically Important Difference (MCID) = 12.8%  COGNITION: Overall cognitive status: {cognition:24006}     SENSATION: {sensation:27233}  MUSCLE LENGTH: Hamstrings: Right *** deg; Left *** deg Debby test: Right *** deg; Left *** deg  POSTURE: {posture:25561}  PALPATION: ***  LUMBAR ROM:   AROM eval  Flexion   Extension   Right lateral flexion   Left lateral flexion   Right rotation   Left rotation    (Blank rows = not tested)  LOWER EXTREMITY ROM:     {AROM/PROM:27142}  Right eval Left eval  Hip flexion    Hip extension    Hip abduction    Hip adduction    Hip internal rotation    Hip external rotation    Knee flexion    Knee extension    Ankle dorsiflexion    Ankle plantarflexion    Ankle inversion    Ankle eversion     (Blank rows = not tested)  LOWER EXTREMITY  MMT:    MMT Right eval Left eval  Hip flexion    Hip extension    Hip abduction    Hip adduction    Hip internal rotation    Hip external rotation    Knee flexion    Knee  extension    Ankle dorsiflexion    Ankle plantarflexion    Ankle inversion    Ankle eversion     (Blank rows = not tested)  LUMBAR SPECIAL TESTS:  {lumbar special test:25242}  FUNCTIONAL TESTS:  {Functional tests:24029}  GAIT: Distance walked: *** Assistive device utilized: {Assistive devices:23999} Level of assistance: {Levels of assistance:24026} Comments: ***  TREATMENT DATE: ***                                                                                                                                 PATIENT EDUCATION:  Education details: *** Person educated: {Person educated:25204} Education method: {Education Method:25205} Education comprehension: {Education Comprehension:25206}  HOME EXERCISE PROGRAM: ***  ASSESSMENT:  CLINICAL IMPRESSION: Patient is a *** y.o. *** who was seen today for physical therapy evaluation and treatment for ***.   OBJECTIVE IMPAIRMENTS: {opptimpairments:25111}.   ACTIVITY LIMITATIONS: {activitylimitations:27494}  PARTICIPATION LIMITATIONS: {participationrestrictions:25113}  PERSONAL FACTORS: {Personal factors:25162} are also affecting patient's functional outcome.   REHAB POTENTIAL: {rehabpotential:25112}  CLINICAL DECISION MAKING: {clinical decision making:25114}  EVALUATION COMPLEXITY: {Evaluation complexity:25115}   GOALS: Goals reviewed with patient? {yes/no:20286}  SHORT TERM GOALS: Target date: ***  *** Baseline: Goal status: INITIAL  2.  *** Baseline:  Goal status: INITIAL  3.  *** Baseline:  Goal status: INITIAL  4.  *** Baseline:  Goal status: INITIAL  5.  *** Baseline:  Goal status: INITIAL  6.  *** Baseline:  Goal status: INITIAL  LONG TERM GOALS: Target date: ***  *** Baseline:  Goal status: INITIAL  2.   *** Baseline:  Goal status: INITIAL  3.  *** Baseline:  Goal status: INITIAL  4.  *** Baseline:  Goal status: INITIAL  5.  *** Baseline:  Goal status: INITIAL  6.  *** Baseline:  Goal status: INITIAL  PLAN:  PT FREQUENCY: {rehab frequency:25116}  PT DURATION: {rehab duration:25117}  PLANNED INTERVENTIONS: {rehab planned interventions:25118::97110-Therapeutic exercises,97530- Therapeutic [phone]- Neuromuscular re-education,97535- Self Rjmz,02859- Manual therapy,Patient/Family education}.  PLAN FOR NEXT SESSION: PIERRETTE Marko Molt, PT 10/14/2024, 1:36 PM

## 2024-11-11 ENCOUNTER — Other Ambulatory Visit: Payer: Self-pay | Admitting: Hematology and Oncology

## 2024-11-11 DIAGNOSIS — D693 Immune thrombocytopenic purpura: Secondary | ICD-10-CM

## 2025-05-14 ENCOUNTER — Inpatient Hospital Stay

## 2025-05-14 ENCOUNTER — Inpatient Hospital Stay: Admitting: Hematology and Oncology
